# Patient Record
Sex: Female | Born: 1970
Health system: Southern US, Community
[De-identification: ages and names within clinical notes are randomized; demographics above are authoritative.]

## PROBLEM LIST (undated history)

## (undated) DIAGNOSIS — N879 Dysplasia of cervix uteri, unspecified: Secondary | ICD-10-CM

## (undated) DIAGNOSIS — E782 Mixed hyperlipidemia: Secondary | ICD-10-CM

## (undated) DIAGNOSIS — J309 Allergic rhinitis, unspecified: Secondary | ICD-10-CM

## (undated) DIAGNOSIS — B019 Varicella without complication: Secondary | ICD-10-CM

## (undated) DIAGNOSIS — F17200 Nicotine dependence, unspecified, uncomplicated: Secondary | ICD-10-CM

## (undated) DIAGNOSIS — R7309 Other abnormal glucose: Secondary | ICD-10-CM

## (undated) DIAGNOSIS — F341 Dysthymic disorder: Secondary | ICD-10-CM

## (undated) DIAGNOSIS — F411 Generalized anxiety disorder: Secondary | ICD-10-CM

## (undated) DIAGNOSIS — G4700 Insomnia, unspecified: Secondary | ICD-10-CM

## (undated) DIAGNOSIS — E079 Disorder of thyroid, unspecified: Secondary | ICD-10-CM

## (undated) DIAGNOSIS — R319 Hematuria, unspecified: Secondary | ICD-10-CM

## (undated) DIAGNOSIS — N912 Amenorrhea, unspecified: Secondary | ICD-10-CM

## (undated) DIAGNOSIS — L708 Other acne: Secondary | ICD-10-CM

## (undated) DIAGNOSIS — M1990 Unspecified osteoarthritis, unspecified site: Secondary | ICD-10-CM

## (undated) DIAGNOSIS — G4710 Hypersomnia, unspecified: Secondary | ICD-10-CM

## (undated) DIAGNOSIS — T7840XA Allergy, unspecified, initial encounter: Secondary | ICD-10-CM

## (undated) DIAGNOSIS — E559 Vitamin D deficiency, unspecified: Secondary | ICD-10-CM

## (undated) DIAGNOSIS — C801 Malignant (primary) neoplasm, unspecified: Secondary | ICD-10-CM

## (undated) HISTORY — DX: Dysthymic disorder: F34.1

## (undated) HISTORY — DX: Other abnormal glucose: R73.09

## (undated) HISTORY — DX: Nicotine dependence, unspecified, uncomplicated: F17.200

## (undated) HISTORY — DX: Varicella without complication: B01.9

## (undated) HISTORY — DX: Other acne: L70.8

## (undated) HISTORY — DX: Allergy, unspecified, initial encounter: T78.40XA

## (undated) HISTORY — DX: Hematuria, unspecified: R31.9

## (undated) HISTORY — DX: Insomnia, unspecified: G47.00

## (undated) HISTORY — DX: Generalized anxiety disorder: F41.1

## (undated) HISTORY — DX: Unspecified osteoarthritis, unspecified site: M19.90

## (undated) HISTORY — DX: Dysplasia of cervix uteri, unspecified: N87.9

## (undated) HISTORY — DX: Malignant (primary) neoplasm, unspecified: C80.1

## (undated) HISTORY — DX: Amenorrhea, unspecified: N91.2

## (undated) HISTORY — DX: Disorder of thyroid, unspecified: E07.9

## (undated) HISTORY — DX: Vitamin D deficiency, unspecified: E55.9

## (undated) HISTORY — DX: Allergic rhinitis, unspecified: J30.9

## (undated) HISTORY — DX: Hypersomnia, unspecified: G47.10

## (undated) HISTORY — DX: Mixed hyperlipidemia: E78.2

---

## 1994-03-20 HISTORY — PX: LAMINECTOMY: SHX219

## 1994-03-20 HISTORY — PX: SPINE SURGERY: SHX786

## 1996-03-20 HISTORY — PX: TUBAL LIGATION: SHX77

## 2003-03-21 HISTORY — PX: OTHER SURGICAL HISTORY: SHX169

## 2007-12-16 ENCOUNTER — Emergency Department (HOSPITAL_COMMUNITY): Admission: EM | Admit: 2007-12-16 | Discharge: 2007-12-16 | Payer: Self-pay | Admitting: Emergency Medicine

## 2010-03-20 HISTORY — PX: TOTAL THYROIDECTOMY: SHX2547

## 2010-07-20 ENCOUNTER — Ambulatory Visit
Admission: RE | Admit: 2010-07-20 | Discharge: 2010-07-20 | Disposition: A | Discharge status: Home / Self Care | Payer: BC Managed Care – PPO | Source: Ambulatory Visit | Attending: Family Medicine | Admitting: Family Medicine

## 2010-07-20 ENCOUNTER — Other Ambulatory Visit: Payer: Self-pay | Admitting: Family Medicine

## 2010-07-20 DIAGNOSIS — R221 Localized swelling, mass and lump, neck: Secondary | ICD-10-CM

## 2010-08-17 ENCOUNTER — Other Ambulatory Visit: Payer: Self-pay | Admitting: General Surgery

## 2010-08-17 DIAGNOSIS — E041 Nontoxic single thyroid nodule: Secondary | ICD-10-CM

## 2010-08-19 ENCOUNTER — Ambulatory Visit
Admission: RE | Admit: 2010-08-19 | Discharge: 2010-08-19 | Disposition: A | Discharge status: Home / Self Care | Payer: BC Managed Care – PPO | Source: Ambulatory Visit | Attending: General Surgery | Admitting: General Surgery

## 2010-08-19 ENCOUNTER — Other Ambulatory Visit (HOSPITAL_COMMUNITY)
Admission: RE | Admit: 2010-08-19 | Discharge: 2010-08-19 | Disposition: A | Discharge status: Home / Self Care | Payer: BC Managed Care – PPO | Source: Ambulatory Visit | Attending: Interventional Radiology | Admitting: Interventional Radiology

## 2010-08-19 ENCOUNTER — Other Ambulatory Visit: Payer: Self-pay | Admitting: Interventional Radiology

## 2010-08-19 DIAGNOSIS — E041 Nontoxic single thyroid nodule: Secondary | ICD-10-CM

## 2010-08-19 DIAGNOSIS — E049 Nontoxic goiter, unspecified: Secondary | ICD-10-CM | POA: Insufficient documentation

## 2010-09-18 DIAGNOSIS — C801 Malignant (primary) neoplasm, unspecified: Secondary | ICD-10-CM

## 2010-09-18 HISTORY — DX: Malignant (primary) neoplasm, unspecified: C80.1

## 2010-09-29 ENCOUNTER — Encounter (HOSPITAL_COMMUNITY): Payer: BC Managed Care – PPO

## 2010-09-29 ENCOUNTER — Other Ambulatory Visit (INDEPENDENT_AMBULATORY_CARE_PROVIDER_SITE_OTHER): Payer: Self-pay | Admitting: General Surgery

## 2010-09-29 ENCOUNTER — Ambulatory Visit (HOSPITAL_COMMUNITY)
Admission: RE | Admit: 2010-09-29 | Discharge: 2010-09-29 | Disposition: A | Discharge status: Home / Self Care | Payer: BC Managed Care – PPO | Source: Ambulatory Visit | Attending: General Surgery | Admitting: General Surgery

## 2010-09-29 DIAGNOSIS — Z01812 Encounter for preprocedural laboratory examination: Secondary | ICD-10-CM | POA: Insufficient documentation

## 2010-09-29 DIAGNOSIS — Z01811 Encounter for preprocedural respiratory examination: Secondary | ICD-10-CM

## 2010-09-29 DIAGNOSIS — Z01818 Encounter for other preprocedural examination: Secondary | ICD-10-CM | POA: Insufficient documentation

## 2010-09-29 DIAGNOSIS — Z01810 Encounter for preprocedural cardiovascular examination: Secondary | ICD-10-CM | POA: Insufficient documentation

## 2010-09-29 LAB — CBC
MCH: 28.3 pg (ref 26.0–34.0)
MCHC: 32.7 g/dL (ref 30.0–36.0)
Platelets: 171 10*3/uL (ref 150–400)
RBC: 4.45 MIL/uL (ref 3.87–5.11)
RDW: 12.8 % (ref 11.5–15.5)

## 2010-09-29 LAB — BASIC METABOLIC PANEL
CO2: 27 mEq/L (ref 19–32)
Calcium: 9.7 mg/dL (ref 8.4–10.5)
Chloride: 102 mEq/L (ref 96–112)
GFR calc Af Amer: >60 mL/min (ref >60)
Sodium: 134 mEq/L — ABNORMAL LOW (ref 135–145)

## 2010-09-29 LAB — SURGICAL PCR SCREEN
MRSA, PCR: NEGATIVE
Staphylococcus aureus: NEGATIVE

## 2010-09-29 LAB — HCG, SERUM, QUALITATIVE: Preg, Serum: NEGATIVE

## 2010-10-07 ENCOUNTER — Other Ambulatory Visit (INDEPENDENT_AMBULATORY_CARE_PROVIDER_SITE_OTHER): Payer: Self-pay | Admitting: General Surgery

## 2010-10-07 ENCOUNTER — Ambulatory Visit (HOSPITAL_COMMUNITY)
Admission: RE | Admit: 2010-10-07 | Discharge: 2010-10-08 | Disposition: A | Discharge status: Home / Self Care | Payer: BC Managed Care – PPO | Source: Ambulatory Visit | Attending: General Surgery | Admitting: General Surgery

## 2010-10-07 DIAGNOSIS — C73 Malignant neoplasm of thyroid gland: Secondary | ICD-10-CM

## 2010-10-07 DIAGNOSIS — Z01818 Encounter for other preprocedural examination: Secondary | ICD-10-CM | POA: Insufficient documentation

## 2010-10-07 DIAGNOSIS — Z01812 Encounter for preprocedural laboratory examination: Secondary | ICD-10-CM | POA: Insufficient documentation

## 2010-10-07 DIAGNOSIS — E119 Type 2 diabetes mellitus without complications: Secondary | ICD-10-CM | POA: Insufficient documentation

## 2010-10-07 DIAGNOSIS — Z01810 Encounter for preprocedural cardiovascular examination: Secondary | ICD-10-CM | POA: Insufficient documentation

## 2010-10-08 LAB — CALCIUM: Calcium: 8.8 mg/dL (ref 8.4–10.5)

## 2010-10-10 NOTE — Op Note (Signed)
NAME:  Taylor, Powers NO.:  [id number]  MEDICAL RECORD NO.:  [id number]  LOCATION:  DAYL                         FACILITY:  [facility]  PHYSICIAN:  Sharlet Salina T. Hager Compston, M.D.DATE OF BIRTH:  [birth year]  DATE OF PROCEDURE:  10/07/2010 DATE OF DISCHARGE:                              OPERATIVE REPORT   PREOPERATIVE DIAGNOSIS:  Right thyroid mass.  POSTOPERATIVE DIAGNOSIS:  Right thyroid mass.  SURGICAL PROCEDURES:  Total thyroidectomy.  SURGEON:  Lorne Skeens. Raelle Chambers, M.D.  ASSISTANT:  Wilmon Arms. Tsuei, M.D.  ANESTHESIA:  General.  BRIEF HISTORY:  Taylor Powers is a 40 year old female who presents with an enlarging mass in the right lobe of her thyroid gland.  This was noted recently by the patient.  Ultrasound has revealed a 4.3-cm mass in the lower right pole, solid with internal microcalcifications.  Fine-needle aspirate has shown follicular cells with mild atypia.  This constellation of findings, I have recommended thyroidectomy.  We have discussed pros and cons of lobectomy versus total thyroidectomy and due to the suspicious nature of the mass and also the fact she has a couple of small nodules on the opposite side, we have elected to proceed with total thyroidectomy.  The nature of the procedure, indications, risks of anesthetic complications, bleeding, infection, injury to the recurrent laryngeal nerve with permanent hoarseness, injury to the parathyroid glands with hypocalcemia had been discussed and understood.  She is now brought to the operating room for this procedure.  DESCRIPTION OF OPERATION:  The patient is brought to the operating room, placed in supine position on the operating table and general orotracheal anesthesia was induced.  She was carefully positioned with her neck extended and the neck and upper chest widely and sterilely prepped and draped.  PAS were in place.  Correct patient and procedure were verified.  A curvilinear incision was  made in the skin crease halfway between the sternal notch and the thyroid cartilage.  Dissection was carried down through subcutaneous tissue and platysma using cautery. Subplatysmal flaps were then developed superiorly to the thyroid cartilage, inferiorly to the sternal notch and laterally out to the anterior border of the sternocleidomastoid.  The strap muscles were then divided in the midline using cautery.  Using careful blunt and cautery dissection, the right side was approached first.  The strap muscles were dissected up off the anterior portion of the right thyroid lobe.  There was a large palpable mass inferiorly.  Careful blunt dissection was used to mobilize the gland anteriorly and inferiorly.  Some areolar tissue and vessels along the inferior pole were divided with harmonic scalpel anteriorly.  Middle thyroid vein was identified, clipped and divided with harmonic scalpel.  Gland was further bluntly mobilized and the upper pole was then exposed and using careful blunt dissection, the superior thyroid vessels were exposed.  We did see what appeared to be the superior parathyroid gland on this side and it was preserved.  The superior pole vessels were isolated, clipped proximally with medium clip and divided with the harmonic scalpel freeing the upper pole.  This allowed the large mass in the gland to be mobilized up anteriorly and the tracheoesophageal groove and inferior  thyroid artery branch was exposed.  Individual branches of the inferior thyroid artery were then isolated, clipped proximally and divided with harmonic scalpel.  As dissection progressed posterior and laterally, we clearly identified the recurrent laryngeal nerve which was then kept in view and protected throughout the manner of dissection.  We continued to divide the small individual branches of the inferior artery on the gland until it was mobilized up to the ligament of Berry and away from the  recurrent laryngeal nerve insertion.  The suspensory ligaments were then divided with cautery and the isthmus freed and the right lobe completely dissected.  Attention was then turned to the left side.  It was exposed and dissected in identical fashion.  I was not able to feel any definite palpable nodules here.  The superior pole was mobilized in identical fashion.  We were also able to see what appeared to be superior parathyroid on the left side as well.  Gland was then mobilized up out of the posterior neck.  The recurrent laryngeal nerve was identified early on in the dissection.  The gland was quite tight to the recurrent laryngeal nerve near its insertion and we did leave a small amount of thyroid tissue overlying the nerve in order to protect it.  The suspensory ligament was then divided with cautery.  A few inferior areolar attachments divided with cautery and the gland removed.  It was oriented and sent for permanent pathology.  The neck was irrigated and complete hemostasis assured.  Surgicel was placed in the thyroid bed. The strap muscles were closed in midline with interrupted 3-0 Vicryl. Platysma was closed with interrupted 3-0 Vicryl and the skin with subcuticular 4-0 Monocryl and Dermabond.  Sponge and needle counts correct.  The patient was taken to recovery in good condition.     Lorne Skeens. Dyonna Jaspers, M.D.     Tory Emerald  D:  10/07/2010  T:  10/07/2010  Job:  [id number]  Electronically Signed by Glenna Fellows M.D. on 10/10/2010 12:25:10 PM

## 2010-10-11 ENCOUNTER — Telehealth (INDEPENDENT_AMBULATORY_CARE_PROVIDER_SITE_OTHER): Payer: Self-pay | Admitting: General Surgery

## 2010-10-11 NOTE — Telephone Encounter (Signed)
Discussed path report with pt. She is doing well from surgery

## 2010-10-28 ENCOUNTER — Encounter (INDEPENDENT_AMBULATORY_CARE_PROVIDER_SITE_OTHER): Payer: BC Managed Care – PPO | Admitting: General Surgery

## 2010-11-02 ENCOUNTER — Encounter (INDEPENDENT_AMBULATORY_CARE_PROVIDER_SITE_OTHER): Payer: Self-pay | Admitting: General Surgery

## 2010-11-03 ENCOUNTER — Encounter (INDEPENDENT_AMBULATORY_CARE_PROVIDER_SITE_OTHER): Payer: Self-pay

## 2010-11-03 ENCOUNTER — Ambulatory Visit (INDEPENDENT_AMBULATORY_CARE_PROVIDER_SITE_OTHER): Payer: BC Managed Care – PPO | Admitting: General Surgery

## 2010-11-03 VITALS — BP 116/76 | HR 64 | Temp 98.5°F

## 2010-11-03 DIAGNOSIS — T8140XA Infection following a procedure, unspecified, initial encounter: Secondary | ICD-10-CM

## 2010-11-03 DIAGNOSIS — C73 Malignant neoplasm of thyroid gland: Secondary | ICD-10-CM | POA: Insufficient documentation

## 2010-11-03 LAB — T4, FREE: Free T4: 0.89 ng/dL (ref 0.80–1.80)

## 2010-11-03 LAB — TSH: TSH: 7.184 u[IU]/mL — ABNORMAL HIGH (ref 0.350–4.500)

## 2010-11-03 NOTE — Patient Instructions (Signed)
I will call with results of lab work. We will schedule for I-131 treatment. Call for questions or concerns.

## 2010-11-03 NOTE — Progress Notes (Signed)
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 She was doing very well. No throat or voice complaints. She is back at work. She has no symptoms of hypo-or hyperthyroidism on 75 mcg of Synthroid daily.  On examination her wound is nicely healed without complication.  She is doing well postoperatively. We have discussed followup I-131 treatment and will get this arranged for her. Check TSH and T4 levels. She will return for cancer followup in 6 months.

## 2010-11-07 ENCOUNTER — Other Ambulatory Visit (INDEPENDENT_AMBULATORY_CARE_PROVIDER_SITE_OTHER): Payer: Self-pay | Admitting: General Surgery

## 2010-11-08 ENCOUNTER — Other Ambulatory Visit (INDEPENDENT_AMBULATORY_CARE_PROVIDER_SITE_OTHER): Payer: Self-pay | Admitting: General Surgery

## 2010-11-08 ENCOUNTER — Telehealth (INDEPENDENT_AMBULATORY_CARE_PROVIDER_SITE_OTHER): Payer: Self-pay | Admitting: General Surgery

## 2010-11-08 ENCOUNTER — Other Ambulatory Visit (INDEPENDENT_AMBULATORY_CARE_PROVIDER_SITE_OTHER): Payer: Self-pay

## 2010-11-08 DIAGNOSIS — C73 Malignant neoplasm of thyroid gland: Secondary | ICD-10-CM

## 2010-11-08 NOTE — Telephone Encounter (Signed)
Spoke with Ms. Eichler 11/08/10, patient lab result for TSH is noted to be abnormal and patient is c/o fatigue over the weekend.  Patient is currently taking .75 mcg Synthroid, please advise if dosage need's to be changed or to continue with current mcg.

## 2010-11-10 ENCOUNTER — Other Ambulatory Visit (INDEPENDENT_AMBULATORY_CARE_PROVIDER_SITE_OTHER): Payer: Self-pay | Admitting: General Surgery

## 2010-11-10 DIAGNOSIS — R7989 Other specified abnormal findings of blood chemistry: Secondary | ICD-10-CM

## 2010-11-10 MED ORDER — LEVOTHYROXINE SODIUM 88 MCG PO TABS: 88.0000 ug | ORAL_TABLET | Freq: Every day | ORAL | Status: DC

## 2010-11-23 ENCOUNTER — Ambulatory Visit (HOSPITAL_COMMUNITY): Payer: BC Managed Care – PPO

## 2010-11-24 ENCOUNTER — Ambulatory Visit (HOSPITAL_COMMUNITY): Payer: BC Managed Care – PPO

## 2010-11-25 ENCOUNTER — Encounter (HOSPITAL_COMMUNITY): Payer: BC Managed Care – PPO

## 2010-11-28 ENCOUNTER — Other Ambulatory Visit (INDEPENDENT_AMBULATORY_CARE_PROVIDER_SITE_OTHER): Payer: Self-pay | Admitting: General Surgery

## 2010-11-28 ENCOUNTER — Encounter (HOSPITAL_COMMUNITY): Payer: BC Managed Care – PPO

## 2010-11-28 DIAGNOSIS — C73 Malignant neoplasm of thyroid gland: Secondary | ICD-10-CM

## 2010-11-29 ENCOUNTER — Ambulatory Visit (HOSPITAL_COMMUNITY): Payer: BC Managed Care – PPO

## 2010-11-30 ENCOUNTER — Encounter (HOSPITAL_COMMUNITY): Payer: BC Managed Care – PPO

## 2010-12-05 ENCOUNTER — Encounter (HOSPITAL_COMMUNITY): Payer: BC Managed Care – PPO

## 2010-12-06 ENCOUNTER — Other Ambulatory Visit (INDEPENDENT_AMBULATORY_CARE_PROVIDER_SITE_OTHER): Payer: Self-pay | Admitting: General Surgery

## 2010-12-06 DIAGNOSIS — C73 Malignant neoplasm of thyroid gland: Secondary | ICD-10-CM

## 2010-12-12 ENCOUNTER — Encounter (HOSPITAL_COMMUNITY)
Admission: RE | Admit: 2010-12-12 | Discharge: 2010-12-12 | Disposition: A | Discharge status: Home / Self Care | Payer: BC Managed Care – PPO | Source: Ambulatory Visit | Attending: General Surgery | Admitting: General Surgery

## 2010-12-12 DIAGNOSIS — C73 Malignant neoplasm of thyroid gland: Secondary | ICD-10-CM | POA: Insufficient documentation

## 2010-12-13 ENCOUNTER — Encounter (HOSPITAL_COMMUNITY)
Admission: RE | Admit: 2010-12-13 | Discharge: 2010-12-13 | Disposition: A | Discharge status: Home / Self Care | Payer: BC Managed Care – PPO | Source: Ambulatory Visit | Attending: General Surgery | Admitting: General Surgery

## 2010-12-14 ENCOUNTER — Encounter (HOSPITAL_COMMUNITY)
Admission: RE | Admit: 2010-12-14 | Discharge: 2010-12-14 | Disposition: A | Discharge status: Home / Self Care | Payer: BC Managed Care – PPO | Source: Ambulatory Visit | Attending: General Surgery | Admitting: General Surgery

## 2010-12-14 LAB — HCG, SERUM, QUALITATIVE: Preg, Serum: NEGATIVE

## 2010-12-14 MED ORDER — SODIUM IODIDE I 131 CAPSULE
108.5000 | Freq: Once | INTRAVENOUS | Status: AC | PRN
  Administered 2010-12-14: 108.5 via ORAL

## 2010-12-26 ENCOUNTER — Other Ambulatory Visit (INDEPENDENT_AMBULATORY_CARE_PROVIDER_SITE_OTHER): Payer: Self-pay | Admitting: General Surgery

## 2010-12-26 ENCOUNTER — Encounter (HOSPITAL_COMMUNITY)
Admission: RE | Admit: 2010-12-26 | Discharge: 2010-12-26 | Disposition: A | Discharge status: Home / Self Care | Payer: BC Managed Care – PPO | Source: Ambulatory Visit | Attending: General Surgery | Admitting: General Surgery

## 2010-12-26 DIAGNOSIS — C73 Malignant neoplasm of thyroid gland: Secondary | ICD-10-CM

## 2011-01-04 ENCOUNTER — Other Ambulatory Visit: Payer: Self-pay | Admitting: Family Medicine

## 2011-01-04 DIAGNOSIS — Z1231 Encounter for screening mammogram for malignant neoplasm of breast: Secondary | ICD-10-CM

## 2011-02-03 ENCOUNTER — Ambulatory Visit: Payer: BC Managed Care – PPO

## 2011-07-21 ENCOUNTER — Ambulatory Visit: Payer: BC Managed Care – PPO

## 2011-12-14 ENCOUNTER — Ambulatory Visit (INDEPENDENT_AMBULATORY_CARE_PROVIDER_SITE_OTHER): Payer: Commercial Managed Care - PPO | Admitting: Physician Assistant

## 2011-12-14 VITALS — BP 116/84 | HR 114 | Temp 98.2°F | Resp 16 | Ht 67.0 in | Wt 219.0 lb

## 2011-12-14 DIAGNOSIS — J029 Acute pharyngitis, unspecified: Secondary | ICD-10-CM

## 2011-12-14 DIAGNOSIS — J020 Streptococcal pharyngitis: Secondary | ICD-10-CM

## 2011-12-14 MED ORDER — DIPHENHYD-HYDROCORT-NYSTATIN MT SUSP: 5.0000 mL | Freq: Four times a day (QID) | OROMUCOSAL | Status: DC | PRN

## 2011-12-14 MED ORDER — AZITHROMYCIN 250 MG PO TABS: ORAL_TABLET | ORAL | Status: DC

## 2011-12-14 NOTE — Progress Notes (Signed)
Patient ID: Taylor Powers MRN: [id number], DOB: Mar 29, 1970, 41 y.o. Date of Encounter: 12/14/2011, 8:59 AM  Primary Physician: Nilda Simmer, MD  Chief Complaint:  Chief Complaint  Patient presents with  . Sore Throat    x yesterday    HPI: 41 y.o. year old female presents with a one day history of sore throat. No fever or chills. No cough, congestion, rhinorrhea, sinus pressure, otalgia, or headache. Normal hearing. No GI complaints. Able to swallow saliva, but hurts to do so. "It feels like knives." "Last time it felt like this it was strep throat." Sore throat is worse along both sides. Has tried chloraseptic spray without success. Decreased appetite secondary to sore throat. Partner with bronchitis, being treated with Z pack and prednisone.   Past Medical History  Diagnosis Date  . Diabetes mellitus   . Thyroid disease   . Arthritis      Home Meds: Prior to Admission medications   Medication Sig Start Date End Date Taking? Authorizing Provider  ALPRAZolam (XANAX) 0.25 MG tablet Take 0.25 mg by mouth as needed.    Yes Historical Provider, MD  buPROPion (WELLBUTRIN XL) 150 MG 24 hr tablet Take 150 mg by mouth daily.     Yes Historical Provider, MD  levothyroxine (SYNTHROID, LEVOTHROID) 112 MCG tablet Take 112 mcg by mouth daily.   Yes Historical Provider, MD  metaxalone (SKELAXIN) 800 MG tablet Take 800 mg by mouth as needed.    Yes Historical Provider, MD  sertraline (ZOLOFT) 100 MG tablet Take 100 mg by mouth daily.   Yes Historical Provider, MD  Cholecalciferol (VITAMIN D3) 50000 UNITS CAPS Take by mouth once a week.      Historical Provider, MD  doxycycline (DORYX) 100 MG DR capsule Take 100 mg by mouth 2 (two) times daily.      Historical Provider, MD  levothyroxine (SYNTHROID, LEVOTHROID) 88 MCG tablet Take 1 tablet (88 mcg total) by mouth daily. 11/10/10 11/10/11  Mariella Saa, MD  sertraline (ZOLOFT) 50 MG tablet Take 50 mg by mouth daily.      Historical Provider, MD    traZODone (DESYREL) 50 MG tablet Take 50 mg by mouth as needed.     Historical Provider, MD    Allergies:  Allergies  Allergen Reactions  . Sulfa Antibiotics Hives    All over body    History   Social History  . Marital Status: Single    Spouse Name: N/A    Number of Children: N/A  . Years of Education: N/A   Occupational History  . Not on file.   Social History Main Topics  . Smoking status: Current Every Day Smoker -- 1.0 packs/day    Types: Cigarettes  . Smokeless tobacco: Not on file  . Alcohol Use: No  . Drug Use: No  . Sexually Active: Not on file   Other Topics Concern  . Not on file   Social History Narrative  . No narrative on file     Review of Systems: Constitutional: negative for chills, fever, night sweats or weight changes HEENT: see above Cardiovascular: negative for chest pain or palpitations Respiratory: negative for hemoptysis, wheezing, or shortness of breath Abdominal: negative for abdominal pain, nausea, vomiting or diarrhea Dermatological: negative for rash Neurologic: negative for headache   Physical Exam: Blood pressure 116/84, pulse 114, temperature 98.2 F (36.8 C), temperature source Oral, resp. rate 16, height 5\' 7"  (1.702 m), weight 219 lb (99.338 kg), SpO2 97.00%., Body mass index is  34.30 kg/(m^2). General: Well developed, well nourished, in no acute distress. Head: Normocephalic, atraumatic, eyes without discharge, sclera non-icteric, nares are patent. Bilateral auditory canals clear, TM's are without perforation, pearly grey with reflective cone of light bilaterally. No sinus TTP. Oral cavity moist, dentition normal. Posterior pharynx with post nasal drip and mild erythema. No peritonsillar abscess or tonsillar exudate. Neck: Supple. No thyromegaly. Full ROM. <2cm AC. Lungs: Clear bilaterally to auscultation without wheezes, rales, or rhonchi. Breathing is unlabored. Heart: RRR with S1 S2. No murmurs, rubs, or gallops  appreciated. Abdomen: Soft, non-tender, non-distended with normoactive bowel sounds. No hepatomegaly. No rebound/guarding. No obvious abdominal masses. Msk:  Strength and tone normal for age. Extremities: No clubbing or cyanosis. No edema. Neuro: Alert and oriented X 3. Moves all extremities spontaneously. CNII-XII grossly in tact. Psych:  Responds to questions appropriately with a normal affect.   Labs: Results for orders placed in visit on 12/14/11  POCT RAPID STREP A (OFFICE)      Component Value Range   Rapid Strep A Screen Negative  Negative     ASSESSMENT AND PLAN:  41 y.o. year old female with pharyngitis -Azithromycin 250 MG #6 2 po first day then 1 po next 4 days no RF -DMM 1 tsp po qid prn sore throat no RF -Tylenol/Motrin prn -Rest/fluids -Stop tobacco use -RTC precautions -RTC 3-5 days if no improvement  Signed, Eula Listen, PA-C 12/14/2011 8:59 AM

## 2011-12-16 ENCOUNTER — Encounter: Payer: Self-pay | Admitting: *Deleted

## 2011-12-21 ENCOUNTER — Encounter: Payer: Self-pay | Admitting: Family Medicine

## 2011-12-21 ENCOUNTER — Ambulatory Visit: Payer: Self-pay | Admitting: Emergency Medicine

## 2011-12-21 DIAGNOSIS — Z111 Encounter for screening for respiratory tuberculosis: Secondary | ICD-10-CM

## 2011-12-21 NOTE — Progress Notes (Signed)
  Tuberculosis Risk Questionnaire  1. Were you born outside the [place] in one of the following parts of the world:    [place], [place], [place], [place] or [place]?  No  2. Have you traveled outside the [place] and lived for more than one month in one of the following parts of the world:  [place], [place], [place], [place] or [place]?  No  3. Do you have a compromised immune system such as from any of the following conditions:  HIV/AIDS, organ or bone marrow transplantation, diabetes, immunosuppressive   medicines (e.g. Prednisone, Remicaide), leukemia, lymphoma, cancer of the   head or neck, gastrectomy or jejunal bypass, end-stage renal disease (on   dialysis), or silicosis?  No    4. Have you ever done one of the following:    Used crack cocaine, injected illegal drugs, worked or resided in jail or prison,   worked or resided at a homeless shelter, or worked as a Research scientist (physical sciences) in   direct contact with patients?  Yes   5. Have you ever been exposed to anyone with infectious tuberculosis?  Yes    Tuberculosis Symptom Questionnaire  Do you currently have any of the following symptoms?  1. Unexplained cough lasting more than 3 weeks? No  Unexplained fever lasting more than 3 weeks. No   3. Night Sweats (sweating that leaves the bedclothes and sheets wet)   No  4. Shortness of Breath No  5. Chest Pain No  6. Unintentional weight loss  No  7. Unexplained fatigue (very tired for no reason) No   Reviewed with Dr. Cleta Alberts who authorized to administer TB skin test.

## 2011-12-22 NOTE — Progress Notes (Signed)
  Subjective:    Patient ID: Taylor Powers, female    DOB: [birth year], 41 y.o.   MRN: [id number]  HPI    Review of Systems     Objective:   Physical Exam        Assessment & Plan:

## 2011-12-23 ENCOUNTER — Encounter (INDEPENDENT_AMBULATORY_CARE_PROVIDER_SITE_OTHER): Payer: Commercial Managed Care - PPO | Admitting: Family Medicine

## 2011-12-23 DIAGNOSIS — Z111 Encounter for screening for respiratory tuberculosis: Secondary | ICD-10-CM

## 2011-12-23 LAB — TB SKIN TEST
Induration: 0 mm
TB Skin Test: NEGATIVE

## 2011-12-25 ENCOUNTER — Ambulatory Visit: Payer: Self-pay | Admitting: Family Medicine

## 2012-01-01 ENCOUNTER — Telehealth: Payer: Self-pay

## 2012-01-01 NOTE — Telephone Encounter (Signed)
Dr. Smith, do you have this form? 

## 2012-01-01 NOTE — Telephone Encounter (Signed)
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 that was sent to [facility] last week.  The patient is starting new job and her employer is requiring this paperwork.  Please review and expedite if possible.  Per patient there is fax number on form that the form should be faxed to.  Please call the patient at [phone].   The patient stated it is ok to leave message on voicemail as she is in orientation for her new job.

## 2012-01-01 NOTE — Telephone Encounter (Signed)
I have signed form and placed in outgoing fax stack at front desk.  Please advise pt that form signed and completed. KMS

## 2012-01-02 NOTE — Telephone Encounter (Signed)
Left message on machine for patient to call back.

## 2012-01-05 NOTE — Telephone Encounter (Signed)
LMOM for pt to return call. 

## 2012-01-05 NOTE — Telephone Encounter (Signed)
LMOM for pt that form was completed and faxed on 14th and she only needs to CB if she has any further ?s/concerns.

## 2012-02-08 ENCOUNTER — Telehealth: Payer: Self-pay | Admitting: *Deleted

## 2012-02-08 MED ORDER — SERTRALINE HCL 100 MG PO TABS: 100.0000 mg | ORAL_TABLET | Freq: Every day | ORAL | Status: DC

## 2012-02-08 NOTE — Telephone Encounter (Signed)
OK to refill: Zoloft 100mg  one daily #90 no refills. KMS

## 2012-02-08 NOTE — Telephone Encounter (Signed)
done

## 2012-02-08 NOTE — Telephone Encounter (Signed)
Pharmacy requesting a refill on Zoloft 100mg  1 qd for anxiety #90.

## 2012-02-09 ENCOUNTER — Telehealth: Payer: Self-pay

## 2012-02-09 NOTE — Telephone Encounter (Signed)
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 SHE IS COMPLETELY OUT OF HER ZOLOFT 100MG  AND NEEDS IT TO BE CALLED INTO THE PHARMACY AS SOON AS POSSIBLE. SHE IS DR. Michaelle Copas PATIENT AND SHE FOLLOWED HER HERE WHEN SHE CAME BACK TO OUR PRACTICE. PLEASE CALL HER WHEN IT HAS BEEN DONE. BEST PHONE [phone] (CELL)  PHARMACY CHOICE IS [facility] IN [place], [place] [phone].  MBC

## 2012-02-09 NOTE — Telephone Encounter (Signed)
Can we refill this prescription for her?

## 2012-02-09 NOTE — Telephone Encounter (Signed)
It was ordered yesterday, see telephone call from 02/08/12

## 2012-02-10 NOTE — Telephone Encounter (Signed)
Patient advised.

## 2012-03-04 ENCOUNTER — Telehealth: Payer: Self-pay | Admitting: *Deleted

## 2012-03-04 NOTE — Telephone Encounter (Signed)
Pharmacy requesting refill on sertraline 100mg  #90.  Please close note if you refill

## 2012-03-05 MED ORDER — SERTRALINE HCL 100 MG PO TABS: ORAL_TABLET | ORAL | Status: DC

## 2012-04-02 ENCOUNTER — Telehealth: Payer: Self-pay | Admitting: *Deleted

## 2012-04-02 MED ORDER — ATORVASTATIN CALCIUM 10 MG PO TABS: 10.0000 mg | ORAL_TABLET | Freq: Every day | ORAL | Status: DC

## 2012-04-02 NOTE — Telephone Encounter (Signed)
Requesting refill on lipitor

## 2012-04-29 ENCOUNTER — Telehealth: Payer: Self-pay | Admitting: *Deleted

## 2012-04-29 NOTE — Telephone Encounter (Signed)
We do not Rx this for the patient.

## 2012-04-29 NOTE — Telephone Encounter (Signed)
[facility] sending refill request on levothyroxine and they would like to know if she can have Sandoz brand.

## 2012-04-30 NOTE — Telephone Encounter (Signed)
Called pharmacy to advise. This was written by Dr Katrinka Blazing, in [place]. Is there a brand difference? Please advise.

## 2012-05-21 ENCOUNTER — Other Ambulatory Visit: Payer: Self-pay | Admitting: Family Medicine

## 2012-05-21 MED ORDER — LEVOTHYROXINE SODIUM 112 MCG PO TABS: 112.0000 ug | ORAL_TABLET | Freq: Every day | ORAL | Status: DC

## 2012-06-18 ENCOUNTER — Encounter: Payer: Self-pay | Admitting: Family Medicine

## 2012-06-18 ENCOUNTER — Ambulatory Visit (INDEPENDENT_AMBULATORY_CARE_PROVIDER_SITE_OTHER): Payer: BC Managed Care – PPO | Admitting: Family Medicine

## 2012-06-18 VITALS — BP 124/72 | HR 82 | Temp 98.0°F | Resp 16 | Ht 67.0 in | Wt 240.0 lb

## 2012-06-18 DIAGNOSIS — Z01419 Encounter for gynecological examination (general) (routine) without abnormal findings: Secondary | ICD-10-CM

## 2012-06-18 DIAGNOSIS — Z1231 Encounter for screening mammogram for malignant neoplasm of breast: Secondary | ICD-10-CM

## 2012-06-18 DIAGNOSIS — Z0000 Encounter for general adult medical examination without abnormal findings: Secondary | ICD-10-CM

## 2012-06-18 LAB — CBC WITH DIFFERENTIAL/PLATELET
Basophils Relative: 1 % (ref 0–1)
Eosinophils Absolute: 0.2 10*3/uL (ref 0.0–0.7)
Eosinophils Relative: 3 % (ref 0–5)
Hemoglobin: 12.7 g/dL (ref 12.0–15.0)
MCH: 29 pg (ref 26.0–34.0)
MCHC: 34.9 g/dL (ref 30.0–36.0)
MCV: 83.1 fL (ref 78.0–100.0)
Monocytes Relative: 7 % (ref 3–12)
Neutrophils Relative %: 61 % (ref 43–77)
Platelets: 213 10*3/uL (ref 150–400)

## 2012-06-18 LAB — HEMOGLOBIN A1C
Hgb A1c MFr Bld: 5.4 % (ref <5.7)
Mean Plasma Glucose: 108 mg/dL (ref <117)

## 2012-06-18 LAB — T4, FREE: Free T4: 0.94 ng/dL (ref 0.80–1.80)

## 2012-06-18 LAB — TSH: TSH: 33.994 u[IU]/mL — ABNORMAL HIGH (ref 0.350–4.500)

## 2012-06-18 LAB — IFOBT (OCCULT BLOOD): IFOBT: NEGATIVE

## 2012-06-18 NOTE — Patient Instructions (Addendum)
Routine general medical examination at a health care facility - Plan: Vitamin D 25 hydroxy, Folate, T4, free, TSH, Vitamin B12, CBC with Differential, Comprehensive metabolic panel, CK, Hemoglobin A1c, Lipid panel, POCT urinalysis dipstick, IFOBT POC (occult bld, rslt in office)  Routine gynecological examination - Plan: Pap IG and HPV (high risk) DNA detection, MM Digital Screening    1.RECOMMEND FOLLOW-UP IN SIX MONTHS.  2.  RECOMMEND WEIGHT LOSS, EXERCISE, SMOKING CESSATION.

## 2012-06-18 NOTE — Progress Notes (Signed)
  Subjective:    Patient ID: Taylor Powers, female    DOB: [birth year], 42 y.o.   MRN: [id number]  HPI    Review of Systems  Constitutional: Negative.   HENT: Negative.   Eyes: Negative.   Respiratory: Negative.   Cardiovascular: Negative.   Gastrointestinal: Negative.   Endocrine: Negative.   Genitourinary: Negative.   Musculoskeletal: Negative.   Skin: Negative.   Allergic/Immunologic: Negative.   Neurological: Negative.   Hematological: Negative.   Psychiatric/Behavioral: Negative.        Objective:   Physical Exam        Assessment & Plan:

## 2012-06-18 NOTE — Progress Notes (Signed)
[address]   [place], [place]  [address]   [phone]  Subjective:    Patient ID: Taylor Powers, female    DOB: Sep 20, 1970, 42 y.o.   MRN: [id number]  HPI This 42 y.o. female presents to establish care and for CPE.    Last physical 11/2010.  Pap smear 11/2010.   Mammogram 2006.   Colonoscopy never. TDAP UTD. Hepatitis B series through employment. Flu vaccine fall 2013. Eye exam never; no glasses or contacts.  Readers. Dental exam two years ago.   Review of Systems  Constitutional: Negative for fever, chills, diaphoresis, activity change, appetite change, fatigue and unexpected weight change.  HENT: Negative for hearing loss, ear pain, nosebleeds, congestion, sore throat, facial swelling, rhinorrhea, sneezing, drooling, mouth sores, trouble swallowing, neck pain, neck stiffness, dental problem, voice change, postnasal drip, sinus pressure, tinnitus and ear discharge.   Eyes: Negative for photophobia, pain, discharge, redness, itching and visual disturbance.  Respiratory: Positive for apnea. Negative for cough, choking, chest tightness, shortness of breath, wheezing and stridor.   Cardiovascular: Negative for chest pain, palpitations and leg swelling.  Gastrointestinal: Negative for nausea, vomiting, abdominal pain, diarrhea, constipation, blood in stool, abdominal distention, anal bleeding and rectal pain.  Endocrine: Negative for cold intolerance, heat intolerance, polydipsia, polyphagia and polyuria.  Genitourinary: Negative for dysuria, urgency, frequency, hematuria, flank pain, decreased urine volume, vaginal bleeding, vaginal discharge, enuresis, difficulty urinating, genital sores, vaginal pain, menstrual problem, pelvic pain and dyspareunia.  Musculoskeletal: Negative for myalgias, back pain, joint swelling, arthralgias and gait problem.  Skin: Negative for color change, pallor, rash and wound.  Allergic/Immunologic: Positive for environmental allergies. Negative for food  allergies and immunocompromised state.  Neurological: Negative for dizziness, tremors, seizures, syncope, facial asymmetry, speech difficulty, weakness, light-headedness, numbness and headaches.  Hematological: Negative for adenopathy. Does not bruise/bleed easily.  Psychiatric/Behavioral: Negative for suicidal ideas, hallucinations, behavioral problems, confusion, sleep disturbance, self-injury, dysphoric mood, decreased concentration and agitation. The patient is not nervous/anxious and is not hyperactive.        Objective:   Physical Exam  Nursing note and vitals reviewed. Constitutional: She is oriented to person, place, and time. She appears well-developed and well-nourished. No distress.  HENT:  Head: Normocephalic and atraumatic.  Right Ear: External ear normal.  Left Ear: External ear normal.  Nose: Nose normal.  Mouth/Throat: Oropharynx is clear and moist.  Eyes: Conjunctivae and EOM are normal. Pupils are equal, round, and reactive to light.  Neck: Normal range of motion. Neck supple. No JVD present. No thyromegaly present.  Cardiovascular: Normal rate, regular rhythm, normal heart sounds and intact distal pulses.  Exam reveals no gallop and no friction rub.   No murmur heard. Pulmonary/Chest: Effort normal and breath sounds normal. No respiratory distress. She has no wheezes. She has no rales.  Abdominal: Soft. Bowel sounds are normal. She exhibits no distension and no mass. There is no tenderness. There is no rebound and no guarding. Hernia confirmed negative in the right inguinal area and confirmed negative in the left inguinal area.  Genitourinary: Rectum normal, vagina normal and uterus normal. Rectal exam shows no fissure, no mass, no tenderness and anal tone normal. No breast swelling, tenderness, discharge or bleeding. Pelvic exam was performed with patient supine. There is no rash, tenderness or lesion on the right labia. There is no rash, tenderness or lesion on the left  labia. Cervix exhibits no motion tenderness, no discharge and no friability. Right adnexum displays no mass, no tenderness and no  fullness. Left adnexum displays no mass, no tenderness and no fullness.  Musculoskeletal:       Right shoulder: Normal.       Left shoulder: Normal.       Cervical back: Normal.       Thoracic back: Normal.       Lumbar back: Normal.  Lymphadenopathy:    She has no cervical adenopathy.       Right: No inguinal adenopathy present.       Left: No inguinal adenopathy present.  Neurological: She is alert and oriented to person, place, and time. No cranial nerve deficit. She exhibits normal muscle tone. Coordination normal.  Skin: Skin is warm and dry. No rash noted. She is not diaphoretic.  Psychiatric: She has a normal mood and affect. Her behavior is normal. Judgment and thought content normal.       Assessment & Plan:  Routine general medical examination at a health care facility - Plan: Vitamin D 25 hydroxy, Folate, T4, free, TSH, Vitamin B12, CBC with Differential, Comprehensive metabolic panel, CK, Hemoglobin A1c, Lipid panel, POCT urinalysis dipstick, IFOBT POC (occult bld, rslt in office)  Routine gynecological examination - Plan: Pap IG and HPV (high risk) DNA detection, MM Digital Screening   Meds ordered this encounter  Medications  . levothyroxine (SYNTHROID, LEVOTHROID) 137 MCG tablet    Sig: Take 1 tablet (137 mcg total) by mouth daily.    Dispense:  30 tablet    Refill:  5

## 2012-06-19 LAB — COMPREHENSIVE METABOLIC PANEL
Alkaline Phosphatase: 81 U/L (ref 39–117)
CO2: 28 mEq/L (ref 19–32)
Creat: 0.78 mg/dL (ref 0.50–1.10)
Glucose, Bld: 73 mg/dL (ref 70–99)
Sodium: 142 mEq/L (ref 135–145)
Total Bilirubin: 0.4 mg/dL (ref 0.3–1.2)
Total Protein: 7.1 g/dL (ref 6.0–8.3)

## 2012-06-19 LAB — CK: Total CK: 39 U/L (ref 7–177)

## 2012-06-19 LAB — VITAMIN D 25 HYDROXY (VIT D DEFICIENCY, FRACTURES): Vit D, 25-Hydroxy: 27 ng/mL — ABNORMAL LOW (ref 30–89)

## 2012-06-20 LAB — PAP IG AND HPV HIGH-RISK: HPV DNA High Risk: DETECTED — AB

## 2012-06-25 ENCOUNTER — Encounter: Payer: Self-pay | Admitting: Family Medicine

## 2012-06-25 MED ORDER — LEVOTHYROXINE SODIUM 137 MCG PO TABS: 137.0000 ug | ORAL_TABLET | Freq: Every day | ORAL | Status: DC

## 2012-06-27 ENCOUNTER — Other Ambulatory Visit: Payer: Self-pay | Admitting: Physician Assistant

## 2012-06-27 DIAGNOSIS — Z0000 Encounter for general adult medical examination without abnormal findings: Secondary | ICD-10-CM | POA: Insufficient documentation

## 2012-06-27 DIAGNOSIS — Z01419 Encounter for gynecological examination (general) (routine) without abnormal findings: Secondary | ICD-10-CM | POA: Insufficient documentation

## 2012-06-27 MED ORDER — ATORVASTATIN CALCIUM 10 MG PO TABS: 10.0000 mg | ORAL_TABLET | Freq: Every day | ORAL | Status: DC

## 2012-06-27 NOTE — Assessment & Plan Note (Signed)
Pap smear obtained; refer for mammogram.  Gynecological exam completed.  No contraception warranted due to same sex relationship.

## 2012-06-27 NOTE — Assessment & Plan Note (Signed)
Anticipatory guidance --- weight loss, exercise. Pap smear obtained. Refer for mammogram.  Hemosure obtained.  Immunizations UTD.  Obtain labs.

## 2012-06-28 ENCOUNTER — Encounter: Payer: Self-pay | Admitting: Family Medicine

## 2012-07-02 ENCOUNTER — Encounter: Payer: Self-pay | Admitting: Family Medicine

## 2012-07-02 MED ORDER — SERTRALINE HCL 100 MG PO TABS: 100.0000 mg | ORAL_TABLET | Freq: Every day | ORAL | Status: DC

## 2012-07-09 ENCOUNTER — Telehealth: Payer: Self-pay | Admitting: Family Medicine

## 2012-07-09 DIAGNOSIS — B977 Papillomavirus as the cause of diseases classified elsewhere: Secondary | ICD-10-CM

## 2012-07-09 NOTE — Telephone Encounter (Signed)
Patient sent MyChart Message agreeable to gynecology referral for persistent HPV on pap smear with normal pap otherwise.  Will place gynecology referral

## 2012-07-16 ENCOUNTER — Ambulatory Visit: Payer: Self-pay

## 2012-09-12 ENCOUNTER — Telehealth: Payer: Self-pay

## 2012-09-12 DIAGNOSIS — F418 Other specified anxiety disorders: Secondary | ICD-10-CM

## 2012-09-12 MED ORDER — BUPROPION HCL ER (XL) 150 MG PO TB24: 150.0000 mg | ORAL_TABLET | Freq: Every day | ORAL | Status: DC

## 2012-09-12 NOTE — Telephone Encounter (Signed)
Pt states that the [facility] on [address] states that they have sent over several rx refill requests for pt's Wellbutrin. Pt would like to know what is going on. Best# [phone]

## 2012-09-12 NOTE — Telephone Encounter (Signed)
Unfortunately I did not receive this request , do you prescribe this for her? It is not in her office note, it is listed as historical in her med list.

## 2012-09-12 NOTE — Telephone Encounter (Signed)
Yes, I prescribe all of her medications.  I sent in refill.  Please advise patient.

## 2012-09-13 ENCOUNTER — Other Ambulatory Visit: Payer: Self-pay | Admitting: Radiology

## 2012-09-14 IMAGING — NM NM RAI THYROID CANCER W/ THYROGEN
1 series · 1 of 1 positions shown · non-contrast
Comparison: Ultrasound 07/20/2010

CLINICAL DATA: Papillary thyroid carcinoma, follicular variant.
Thyroidectomy September 2010.

RADIOACTIVE IODINE THERAPY FOR THYROID CANCER:

[Series 1: st static image · 1.72mm/px · 1 of 1 slices shown]
[im 1/1]
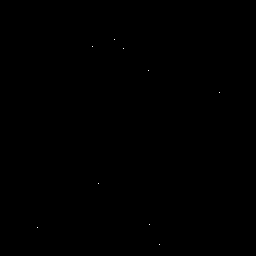

[1 of 1 positions shown; findings below may reference images not displayed]

Procedure:  The risks and benefits of radioactive iodine therapy
were discussed with the patient in detail.  Alternative therapies
were also mentioned.  Radiation safety was discussed with the
patient, including how to protect the general public from exposure.
There were no barriers to communication.  Written consent was
obtained.  The patient then received a capsule containing the
radiopharmaceutical.

The patient will follow-up with the referring physician.

Radiopharmaceutical:  108.5  mCi K-9T9 sodium iodide.
IMPRESSION: Per oral administration of K-9T9 sodium iodide for the treatment of
thyroid cancer.

## 2012-11-05 ENCOUNTER — Telehealth: Payer: Self-pay

## 2012-11-05 MED ORDER — METAXALONE 800 MG PO TABS: 800.0000 mg | ORAL_TABLET | Freq: Three times a day (TID) | ORAL | Status: DC

## 2012-11-05 NOTE — Telephone Encounter (Signed)
Patient notified and voiced understanding.

## 2012-11-05 NOTE — Telephone Encounter (Signed)
Dr. Katrinka Blazing - please advise. I do not see where we have given her this.

## 2012-11-05 NOTE — Telephone Encounter (Signed)
PT WOULD LIKE A REFILL ON HER SKELAXIN SHE WAS GIVEN A WHILE AGO FROM DR Katrinka Blazing. PLEASE CALL [phone]   [facility] ON [address]

## 2012-11-05 NOTE — Telephone Encounter (Signed)
Refill of Skelaxin provided; please advise patient.

## 2013-01-23 ENCOUNTER — Other Ambulatory Visit: Payer: Self-pay

## 2013-01-23 DIAGNOSIS — Z0271 Encounter for disability determination: Secondary | ICD-10-CM

## 2013-01-28 ENCOUNTER — Other Ambulatory Visit: Payer: Self-pay | Admitting: Family Medicine

## 2013-03-03 ENCOUNTER — Other Ambulatory Visit: Payer: Self-pay | Admitting: Family Medicine

## 2013-04-03 ENCOUNTER — Other Ambulatory Visit: Payer: Self-pay | Admitting: Family Medicine

## 2013-04-10 ENCOUNTER — Other Ambulatory Visit: Payer: Self-pay | Admitting: Family Medicine

## 2013-04-11 ENCOUNTER — Telehealth: Payer: Self-pay

## 2013-04-11 MED ORDER — LEVOTHYROXINE SODIUM 137 MCG PO TABS: 137.0000 ug | ORAL_TABLET | Freq: Every day | ORAL | Status: DC

## 2013-04-11 NOTE — Telephone Encounter (Signed)
Pt has appt sched for 05/20/13 so I will send in a RF.

## 2013-04-11 NOTE — Telephone Encounter (Signed)
Spoke to pt- sent in refill for 30 days reminded her of her appt on March 3.

## 2013-04-11 NOTE — Telephone Encounter (Signed)
Patient states that she needs a refill on Levothyroxine. Patient knows that she needs an OV however she wants to know if she can have enough pills to last her until she comes in for an OV with Dr. Tamala Julian on Wednesday morning. Patient also states that she is going out of town at noon today; if we can't send it to her regular pharmacy in time ([facility]) can we send it to [facility] in [place], [place]?  [phone]

## 2013-05-20 ENCOUNTER — Encounter: Payer: Self-pay | Admitting: Family Medicine

## 2013-05-20 ENCOUNTER — Ambulatory Visit (INDEPENDENT_AMBULATORY_CARE_PROVIDER_SITE_OTHER): Payer: BC Managed Care – PPO | Admitting: Family Medicine

## 2013-05-20 VITALS — BP 120/82 | HR 90 | Temp 98.2°F | Resp 16 | Ht 67.5 in | Wt 237.0 lb

## 2013-05-20 DIAGNOSIS — R5381 Other malaise: Secondary | ICD-10-CM

## 2013-05-20 DIAGNOSIS — R0683 Snoring: Secondary | ICD-10-CM | POA: Insufficient documentation

## 2013-05-20 DIAGNOSIS — R87810 Cervical high risk human papillomavirus (HPV) DNA test positive: Secondary | ICD-10-CM | POA: Insufficient documentation

## 2013-05-20 DIAGNOSIS — E7800 Pure hypercholesterolemia, unspecified: Secondary | ICD-10-CM | POA: Insufficient documentation

## 2013-05-20 DIAGNOSIS — R7309 Other abnormal glucose: Secondary | ICD-10-CM

## 2013-05-20 DIAGNOSIS — C73 Malignant neoplasm of thyroid gland: Secondary | ICD-10-CM

## 2013-05-20 DIAGNOSIS — E782 Mixed hyperlipidemia: Secondary | ICD-10-CM

## 2013-05-20 DIAGNOSIS — F418 Other specified anxiety disorders: Secondary | ICD-10-CM | POA: Insufficient documentation

## 2013-05-20 DIAGNOSIS — R490 Dysphonia: Secondary | ICD-10-CM

## 2013-05-20 DIAGNOSIS — G4726 Circadian rhythm sleep disorder, shift work type: Secondary | ICD-10-CM

## 2013-05-20 DIAGNOSIS — R5383 Other fatigue: Secondary | ICD-10-CM

## 2013-05-20 DIAGNOSIS — Z1239 Encounter for other screening for malignant neoplasm of breast: Secondary | ICD-10-CM

## 2013-05-20 DIAGNOSIS — J309 Allergic rhinitis, unspecified: Secondary | ICD-10-CM | POA: Insufficient documentation

## 2013-05-20 LAB — COMPLETE METABOLIC PANEL WITH GFR
ALK PHOS: 97 U/L (ref 39–117)
ALT: 46 U/L — ABNORMAL HIGH (ref 0–35)
AST: 29 U/L (ref 0–37)
Albumin: 4.4 g/dL (ref 3.5–5.2)
BILIRUBIN TOTAL: 0.4 mg/dL (ref 0.2–1.2)
BUN: 14 mg/dL (ref 6–23)
CO2: 27 meq/L (ref 19–32)
CREATININE: 0.8 mg/dL (ref 0.50–1.10)
Calcium: 9.3 mg/dL (ref 8.4–10.5)
Chloride: 105 mEq/L (ref 96–112)
GFR, Est African American: >89 mL/min
GLUCOSE: 98 mg/dL (ref 70–99)
Potassium: 4.5 mEq/L (ref 3.5–5.3)
Sodium: 139 mEq/L (ref 135–145)
Total Protein: 7.2 g/dL (ref 6.0–8.3)

## 2013-05-20 LAB — CBC WITH DIFFERENTIAL/PLATELET
BASOS PCT: 1 % (ref 0–1)
Basophils Absolute: 0.1 10*3/uL (ref 0.0–0.1)
Eosinophils Absolute: 0.8 10*3/uL — ABNORMAL HIGH (ref 0.0–0.7)
Eosinophils Relative: 12 % — ABNORMAL HIGH (ref 0–5)
HCT: 39.1 % (ref 36.0–46.0)
HEMOGLOBIN: 13.5 g/dL (ref 12.0–15.0)
LYMPHS ABS: 1.8 10*3/uL (ref 0.7–4.0)
LYMPHS PCT: 28 % (ref 12–46)
MCH: 29.5 pg (ref 26.0–34.0)
MCHC: 34.5 g/dL (ref 30.0–36.0)
MCV: 85.4 fL (ref 78.0–100.0)
MONOS PCT: 6 % (ref 3–12)
Monocytes Absolute: 0.4 10*3/uL (ref 0.1–1.0)
NEUTROS ABS: 3.4 10*3/uL (ref 1.7–7.7)
Neutrophils Relative %: 53 % (ref 43–77)
Platelets: 200 10*3/uL (ref 150–400)
RBC: 4.58 MIL/uL (ref 3.87–5.11)
RDW: 13.9 % (ref 11.5–15.5)
WBC: 6.5 10*3/uL (ref 4.0–10.5)

## 2013-05-20 LAB — LIPID PANEL
Cholesterol: 140 mg/dL (ref 0–200)
HDL: 29 mg/dL — ABNORMAL LOW (ref >39)
LDL CALC: 84 mg/dL (ref 0–99)
TRIGLYCERIDES: 136 mg/dL (ref <150)
Total CHOL/HDL Ratio: 4.8 Ratio
VLDL: 27 mg/dL (ref 0–40)

## 2013-05-20 LAB — TSH: TSH: 1.718 u[IU]/mL (ref 0.350–4.500)

## 2013-05-20 LAB — HEMOGLOBIN A1C
HEMOGLOBIN A1C: 5.9 % — AB (ref <5.7)
Mean Plasma Glucose: 123 mg/dL — ABNORMAL HIGH (ref <117)

## 2013-05-20 LAB — VITAMIN B12: Vitamin B-12: 340 pg/mL (ref 211–911)

## 2013-05-20 LAB — T4, FREE: Free T4: 1.07 ng/dL (ref 0.80–1.80)

## 2013-05-20 MED ORDER — LEVOTHYROXINE SODIUM 150 MCG PO TABS: 150.0000 ug | ORAL_TABLET | Freq: Every day | ORAL | Status: DC

## 2013-05-20 MED ORDER — BUPROPION HCL ER (XL) 150 MG PO TB24: 150.0000 mg | ORAL_TABLET | Freq: Every day | ORAL | Status: DC

## 2013-05-20 MED ORDER — MODAFINIL 100 MG PO TABS: 100.0000 mg | ORAL_TABLET | Freq: Every day | ORAL | Status: DC

## 2013-05-20 MED ORDER — FLUTICASONE PROPIONATE 50 MCG/ACT NA SUSP: 2.0000 | Freq: Every day | NASAL | Status: DC

## 2013-05-20 MED ORDER — SERTRALINE HCL 100 MG PO TABS: 100.0000 mg | ORAL_TABLET | Freq: Every day | ORAL | Status: DC

## 2013-05-20 NOTE — Progress Notes (Signed)
This chart was scribed for Wardell Honour, MD by Eston Mould, ED Scribe. This patient was seen in room Room/bed 24 and the patient's care was started at 8:30 AM. Subjective:    Patient ID: Taylor Powers, female    DOB: January 29, 1971, 43 y.o.   MRN: [id number]  05/20/2013  Follow-up and Medication Refill  HPI Taylor Powers is a 43 y.o. female who presents to the [facility] for medication refill and F/U appointment for surgical hypothyroid secondary to thyroid cancer, anxiety and depression. Pts last visit to [facility] was 06/2012 for CPE. Pt vitamin D level was low at last year's physical.  Pt states she has been fine but states she has less energy then normal. She states she has been taking her medications as prescribed, states she had 2 week of feeling normal (working in the yard and working out) but states she began to feel less energy. Pt states she has cut off Aspertane and reports eating healthy for a short period of time. Pt states when she ran out of her Thyroid medication, she began feeling weak and having less energy. She states she did not have her medication for 5 days. She states she can go to work but states she feels tired although she has been taking her medications. She denies CP, abd pain, nausea, emesis, chills, diaphoresis, cough, and frequency. Pt states she feels normal when she takes half a pill of Nuvigil. Pt states she generally takes this medication when driving home from work. She states she drives about 1.5 hours. She states she would generally take 2 halves a week and states she feels normal.  She works 16 hour shifts on each weekend day; she sleeps six hours in between 16 hour shifts.  Pt states she is emotionally happy. She states she will be getting married in October and reports living in a nice neighborhood. Pt states her daughter graduated a year early and is in college. Pt states she works at a [employer] in [employer]. She states she loves her job. Pt states  she is currently in school and reports graduating with her BSN soon. Pt states she has plans to attend [employer]. Pt states she works 6 hours, sleep, then 16, sleep, then 6, sleep, then 16 hours over the weekends. She states she does not work 4 days out the week (Monday-Friday).   Pt denies ever having a sleep study done. She states she suspects her thyroid is causing her fatigue/tired. Pt states she snores and states this is due to gaining weight. Pt denies having a mammogram done last year. Pt states she is currently fasting but states she is not fasting from coffee. Pt states she is still smoking.  Pt states she is hoarse. She states this has been going on for the past 8 months. Pt states she has been nasal congested for about 6 months. Pt denies having Flonase but states she has Claritin at home.She states the building she works in is old and believes the building has mold. Pt also complains of having muscle soreness but states she suspects this is due to limited activity.   Pt states she is taking her Wellbutrin and Zoloft as prescribed and states she is doing "fine". She states she is taking her Thyroid medication as prescribed. Pt states she has had a flu shot this season at work.  HPV+ on pap: referred to Dr. Charlesetta Garibaldi; never received appointment but was not motivated to go  to appointment.     Review of Systems  Constitutional: Positive for activity change and fatigue. Negative for fever, chills, diaphoresis and unexpected weight change.  HENT: Positive for congestion, postnasal drip and voice change. Negative for ear pain, rhinorrhea, sinus pressure, sore throat and trouble swallowing.   Respiratory: Negative for cough and shortness of breath.   Cardiovascular: Negative for chest pain and palpitations.  Gastrointestinal: Negative for nausea, vomiting, abdominal pain and diarrhea.  Endocrine: Negative for cold intolerance, heat intolerance, polydipsia, polyphagia  and polyuria.  Genitourinary: Negative for frequency.  Neurological: Negative for dizziness, light-headedness, numbness and headaches.  Psychiatric/Behavioral: Negative for sleep disturbance and dysphoric mood. The patient is not nervous/anxious.     Past Medical History  Diagnosis Date  . Diabetes mellitus   . Thyroid disease   . Insomnia, unspecified   . Hematuria, unspecified   . Absence of menstruation   . Other acne   . Hypersomnia, unspecified     night shift work  . Anxiety state, unspecified   . Tobacco use disorder   . Dysthymic disorder   . Dysplasia of cervix, unspecified   . Allergic rhinitis, cause unspecified   . Mixed hyperlipidemia   . Unspecified vitamin D deficiency   . Other abnormal glucose   . Chicken pox   . Arthritis     DDD L4-5; ruptured disc; laminectomy.  . Cancer 09/18/2010    thyroid cancer; s/p thyroidectomy total; s/p ablation.  [place].   Allergies  Allergen Reactions  . Sulfa Antibiotics Hives    All over body   Current Outpatient Prescriptions  Medication Sig Dispense Refill  . Armodafinil (NUVIGIL) 150 MG tablet Take 150 mg by mouth daily. Take one hour prior to shift.      Marland Kitchen atorvastatin (LIPITOR) 10 MG tablet Take 1 tablet (10 mg total) by mouth daily.  30 tablet  5  . buPROPion (WELLBUTRIN XL) 150 MG 24 hr tablet Take 1 tablet (150 mg total) by mouth daily.  90 tablet  1  . levothyroxine (SYNTHROID, LEVOTHROID) 150 MCG tablet Take 1 tablet (150 mcg total) by mouth daily. PATIENT NEEDS OFFICE VISIT FOR ADDITIONAL REFILLS  30 tablet  5  . metaxalone (SKELAXIN) 800 MG tablet Take 1 tablet (800 mg total) by mouth 3 (three) times daily.  60 tablet  1  . sertraline (ZOLOFT) 100 MG tablet Take 1 tablet (100 mg total) by mouth daily.  90 tablet  1  . fluticasone (FLONASE) 50 MCG/ACT nasal spray Place 2 sprays into both nostrils daily.  16 g  11  . modafinil (PROVIGIL) 100 MG tablet Take 1 tablet (100 mg total) by mouth daily.  30 tablet  2    No current facility-administered medications for this visit.       Objective:    BP 120/82  Pulse 90  Temp(Src) 98.2 F (36.8 C)  Resp 16  Ht 5' 7.5" (1.715 m)  Wt 237 lb (107.502 kg)  BMI 36.55 kg/m2  SpO2 95%  LMP 03/05/2013  Physical Exam  Nursing note and vitals reviewed. Constitutional: She is oriented to person, place, and time. She appears well-developed and well-nourished. No distress.  HENT:  Head: Normocephalic and atraumatic.  Right Ear: External ear normal.  Left Ear: External ear normal.  Mouth/Throat: Oropharynx is clear and moist.  Drainage in nose bilaterally.  Eyes: Conjunctivae and EOM are normal. Pupils are equal, round, and reactive to light.  Neck: Normal range of motion. Neck supple. No JVD present.  No thyromegaly present.  Cardiovascular: Normal rate, regular rhythm and normal heart sounds.  Exam reveals no gallop and no friction rub.   No murmur heard. Pulmonary/Chest: Effort normal and breath sounds normal. No stridor. No respiratory distress. She has no wheezes. She has no rales.  Abdominal: Soft. Bowel sounds are normal. She exhibits no distension and no mass. There is no tenderness. There is no rebound and no guarding.  Musculoskeletal: Normal range of motion. She exhibits no tenderness.  Neurological: She is alert and oriented to person, place, and time. She has normal reflexes.  Skin: Skin is warm and dry. No rash noted. She is not diaphoretic.  Psychiatric: She has a normal mood and affect. Her behavior is normal. Judgment and thought content normal.   Results for orders placed in visit on 06/18/12  VITAMIN D 25 HYDROXY      Result Value Ref Range   Vit D, 25-Hydroxy 27 (*) 30 - 89 ng/mL  FOLATE      Result Value Ref Range   Folate >20.0    T4, FREE      Result Value Ref Range   Free T4 0.94  0.80 - 1.80 ng/dL  TSH      Result Value Ref Range   TSH 33.994 (*) 0.350 - 4.500 uIU/mL  VITAMIN B12      Result Value Ref Range   Vitamin  B-12 486  211 - 911 pg/mL  CBC WITH DIFFERENTIAL      Result Value Ref Range   WBC 6.1  4.0 - 10.5 K/uL   RBC 4.38  3.87 - 5.11 MIL/uL   Hemoglobin 12.7  12.0 - 15.0 g/dL   HCT 36.4  36.0 - 46.0 %   MCV 83.1  78.0 - 100.0 fL   MCH 29.0  26.0 - 34.0 pg   MCHC 34.9  30.0 - 36.0 g/dL   RDW 13.5  11.5 - 15.5 %   Platelets 213  150 - 400 K/uL   Neutrophils Relative % 61  43 - 77 %   Neutro Abs 3.8  1.7 - 7.7 K/uL   Lymphocytes Relative 28  12 - 46 %   Lymphs Abs 1.7  0.7 - 4.0 K/uL   Monocytes Relative 7  3 - 12 %   Monocytes Absolute 0.4  0.1 - 1.0 K/uL   Eosinophils Relative 3  0 - 5 %   Eosinophils Absolute 0.2  0.0 - 0.7 K/uL   Basophils Relative 1  0 - 1 %   Basophils Absolute 0.0  0.0 - 0.1 K/uL   Smear Review Criteria for review not met    COMPREHENSIVE METABOLIC PANEL      Result Value Ref Range   Sodium 142  135 - 145 mEq/L   Potassium 4.2  3.5 - 5.3 mEq/L   Chloride 103  96 - 112 mEq/L   CO2 28  19 - 32 mEq/L   Glucose, Bld 73  70 - 99 mg/dL   BUN 10  6 - 23 mg/dL   Creat 0.78  0.50 - 1.10 mg/dL   Total Bilirubin 0.4  0.3 - 1.2 mg/dL   Alkaline Phosphatase 81  39 - 117 U/L   AST 19  0 - 37 U/L   ALT 28  0 - 35 U/L   Total Protein 7.1  6.0 - 8.3 g/dL   Albumin 4.2  3.5 - 5.2 g/dL   Calcium 8.9  8.4 - 10.5 mg/dL  CK  Result Value Ref Range   Total CK 39  7 - 177 U/L  HEMOGLOBIN A1C      Result Value Ref Range   Hemoglobin A1C 5.4  <5.7 %   Mean Plasma Glucose 108  <117 mg/dL  LIPID PANEL      Result Value Ref Range   Cholesterol 125  0 - 200 mg/dL   Triglycerides 144  <150 mg/dL   HDL 26 (*) >39 mg/dL   Total CHOL/HDL Ratio 4.8     VLDL 29  0 - 40 mg/dL   LDL Cholesterol 70  0 - 99 mg/dL  IFOBT (OCCULT BLOOD)      Result Value Ref Range   IFOBT Negative    PAP IG AND HPV HIGH-RISK      Result Value Ref Range   HPV DNA High Risk Detected (*)    Specimen adequacy:       FINAL DIAGNOSIS:       Cytotechnologist:           Assessment & Plan:    Depression with anxiety - Plan: buPROPion (WELLBUTRIN XL) 150 MG 24 hr tablet  Breast cancer screening - Plan: MM Digital Screening, COMPLETE METABOLIC PANEL WITH GFR, CBC with Differential  Cancer of thyroid T3N0 S/P thyroidectomy 10/07/10 - Plan: TSH, T4, Free  Allergic rhinitis, cause unspecified  Other malaise and fatigue - Plan: Vitamin B12, COMPLETE METABOLIC PANEL WITH GFR, CBC with Differential  Shift work sleep disorder - Plan: Vitamin B12  Snoring - Plan: COMPLETE METABOLIC PANEL WITH GFR, CBC with Differential  Pure hypercholesterolemia - Plan: Lipid panel, COMPLETE METABOLIC PANEL WITH GFR, CBC with Differential  Other abnormal glucose - Plan: Hemoglobin A1c  Hoarseness  Cervical high risk HPV (human papillomavirus) test positive  1.  Depression with anxiety: controlled; refills provided.  No stressors contributing to fatigue. 2.  Breast cancer screening: non-compliance with mammogram in 2014; referral placed again. 3.  Thyroid cancer: stable; s/p thyroidectomy; obtain labs.   4.  Allergic Rhinitis: uncontrolled; likely cause of hoarseness; rx for flonase provided.  Recommend starting Claritin one daily at home; follow-up for CPE in one month; will confirm resolution. 5.  Hoarseness:  New.  Secondary to PND; treat with Flonase, Claritin. If persists, refer to ENT due to smoking hx and history of thyroid cancer. 6.  Malaise and fatigue:  New.  Obtain labs including TSH.  If normal, highly recommend sleep study.  Does work shift work on weekends yet likely not cause of fatigue. 7. Shift work sleep disorder: persistent; agreeable to trial of Provigil $RemoveBef'100mg'VAXaNnnySn$  PRN.  Rx provided. 8.  Snoring: Chronic with worsening with weight gain.  If fatigue does not resolve, refer for sleep study. 9.  Hyperlipidemia: stable; compliance with Lipitor; obtain labs. 10.  Glucose intolerance: stable; obtain labs.  Recent weight gain. 11. HPV high risk+:  Persistent; pt non-compliant with referral  to gynecology; agreeable to make appointment.    Meds ordered this encounter  Medications  . fluticasone (FLONASE) 50 MCG/ACT nasal spray    Sig: Place 2 sprays into both nostrils daily.    Dispense:  16 g    Refill:  11  . levothyroxine (SYNTHROID, LEVOTHROID) 150 MCG tablet    Sig: Take 1 tablet (150 mcg total) by mouth daily. PATIENT NEEDS OFFICE VISIT FOR ADDITIONAL REFILLS    Dispense:  30 tablet    Refill:  5  . modafinil (PROVIGIL) 100 MG tablet    Sig: Take 1 tablet (100  mg total) by mouth daily.    Dispense:  30 tablet    Refill:  2  . buPROPion (WELLBUTRIN XL) 150 MG 24 hr tablet    Sig: Take 1 tablet (150 mg total) by mouth daily.    Dispense:  90 tablet    Refill:  1  . sertraline (ZOLOFT) 100 MG tablet    Sig: Take 1 tablet (100 mg total) by mouth daily.    Dispense:  90 tablet    Refill:  1    Order Specific Question:  Supervising Provider    Answer:  DOOLITTLE, ROBERT P [[id number]]     Meds ordered this encounter  Medications  . fluticasone (FLONASE) 50 MCG/ACT nasal spray    Sig: Place 2 sprays into both nostrils daily.    Dispense:  16 g    Refill:  11  . levothyroxine (SYNTHROID, LEVOTHROID) 150 MCG tablet    Sig: Take 1 tablet (150 mcg total) by mouth daily. PATIENT NEEDS OFFICE VISIT FOR ADDITIONAL REFILLS    Dispense:  30 tablet    Refill:  5  . modafinil (PROVIGIL) 100 MG tablet    Sig: Take 1 tablet (100 mg total) by mouth daily.    Dispense:  30 tablet    Refill:  2  . buPROPion (WELLBUTRIN XL) 150 MG 24 hr tablet    Sig: Take 1 tablet (150 mg total) by mouth daily.    Dispense:  90 tablet    Refill:  1  . sertraline (ZOLOFT) 100 MG tablet    Sig: Take 1 tablet (100 mg total) by mouth daily.    Dispense:  90 tablet    Refill:  1    Order Specific Question:  Supervising Provider    Answer:  DOOLITTLE, ROBERT P [[id number]]    No Follow-up on file.    I personally performed the services described in this documentation, which was scribed in my  presence.  The recorded information has been reviewed and is accurate.  Reginia Forts, M.D.  [facility] [address] [place], [place]  [address] [phone] phone [phone] fax

## 2013-05-21 ENCOUNTER — Encounter: Payer: Self-pay | Admitting: Family Medicine

## 2013-06-05 ENCOUNTER — Ambulatory Visit: Payer: Self-pay

## 2013-06-06 ENCOUNTER — Other Ambulatory Visit: Payer: Self-pay | Admitting: Physician Assistant

## 2013-06-17 ENCOUNTER — Ambulatory Visit (INDEPENDENT_AMBULATORY_CARE_PROVIDER_SITE_OTHER): Payer: BC Managed Care – PPO | Admitting: Family Medicine

## 2013-06-17 ENCOUNTER — Encounter: Payer: Self-pay | Admitting: Family Medicine

## 2013-06-17 VITALS — BP 117/78 | HR 100 | Temp 98.2°F | Resp 18 | Ht 67.5 in | Wt 233.0 lb

## 2013-06-17 DIAGNOSIS — R945 Abnormal results of liver function studies: Secondary | ICD-10-CM

## 2013-06-17 DIAGNOSIS — Z0000 Encounter for general adult medical examination without abnormal findings: Secondary | ICD-10-CM

## 2013-06-17 DIAGNOSIS — IMO0002 Reserved for concepts with insufficient information to code with codable children: Secondary | ICD-10-CM

## 2013-06-17 DIAGNOSIS — N926 Irregular menstruation, unspecified: Secondary | ICD-10-CM

## 2013-06-17 DIAGNOSIS — R7989 Other specified abnormal findings of blood chemistry: Secondary | ICD-10-CM

## 2013-06-17 LAB — POCT UA - MICROSCOPIC ONLY
CASTS, UR, LPF, POC: NEGATIVE
Crystals, Ur, HPF, POC: NEGATIVE
Mucus, UA: POSITIVE
Yeast, UA: NEGATIVE

## 2013-06-17 LAB — POCT URINALYSIS DIPSTICK
BILIRUBIN UA: NEGATIVE
GLUCOSE UA: NEGATIVE
Ketones, UA: NEGATIVE
Nitrite, UA: NEGATIVE
PH UA: 6
Protein, UA: NEGATIVE
Urobilinogen, UA: 0.2

## 2013-06-17 LAB — COMPLETE METABOLIC PANEL WITH GFR
ALK PHOS: 93 U/L (ref 39–117)
ALT: 39 U/L — AB (ref 0–35)
AST: 25 U/L (ref 0–37)
Albumin: 4.4 g/dL (ref 3.5–5.2)
BILIRUBIN TOTAL: 0.3 mg/dL (ref 0.2–1.2)
BUN: 13 mg/dL (ref 6–23)
CO2: 25 meq/L (ref 19–32)
Calcium: 9.3 mg/dL (ref 8.4–10.5)
Chloride: 104 mEq/L (ref 96–112)
Creat: 0.8 mg/dL (ref 0.50–1.10)
GFR, Est Non African American: >89 mL/min
GLUCOSE: 95 mg/dL (ref 70–99)
POTASSIUM: 4.3 meq/L (ref 3.5–5.3)
SODIUM: 140 meq/L (ref 135–145)
TOTAL PROTEIN: 7.1 g/dL (ref 6.0–8.3)

## 2013-06-17 LAB — LUTEINIZING HORMONE: LH: 26.8 m[IU]/mL

## 2013-06-17 LAB — FOLLICLE STIMULATING HORMONE: FSH: 48.5 m[IU]/mL

## 2013-06-17 NOTE — Progress Notes (Signed)
   Subjective:    Patient ID: Taylor Powers, female    DOB: Jul 15, 1970, 43 y.o.   MRN: [id number]  HPI    Review of Systems  Constitutional: Positive for activity change and fatigue.  HENT: Positive for postnasal drip, rhinorrhea and voice change.   Musculoskeletal: Positive for arthralgias.  Allergic/Immunologic: Positive for environmental allergies.       Objective:   Physical Exam        Assessment & Plan:

## 2013-06-17 NOTE — Progress Notes (Signed)
Subjective:  This chart was scribed for Dr. Reginia Forts, MD by Stacy Gardner, [facility] Scribe. The patient was seen in room and the patient's care was started at 1:25 PM.   Patient ID: Taylor Powers, female    DOB: 1970/04/01, 43 y.o.   MRN: [id number]  06/17/2013  Annual Exam   HPI HPI Comments: Taylor Powers is a 43 y.o. female who arrives to the [facility] for an annual exam.  She reports that her vision is "going" and she attributes this to getting old.  Pt has mild numbness and tingling to her fingertips that is worse with compression. Denies numbness and tingling to her feet. Denies tinnitus, headaches. Denies chest pain and chest palpation. Denies neck pain.  She regularly checks her breast for masses and denies feeling any abnormalities.   Pt feels great and works in the yard regularly. Pt mentions having improvement of her fatigue and feels more energized after taking the medication Rx during her last visit. Pt was seen for hoarseness which she reports that it has drastically improved. She drinks diet pepsi and vitamin water. Pt mentions Pepsi makes her joints hurt.  She reports vaginal spotting today and denies any associated pain. Her prior menses was last year after a period of not having one for the past year.  Mother and grandmother went through early menopause.  Denies hot flashes. She denies urinary incontinence and urgency.  Pt reports her urine smells like dirt.  Denies hematochezia, irregular BM and constipation   She is taking steps to lose weight and is trying to lose 25 lbs.   Pt has been in a committed relationship for the past 8 years and looks forward to getting married.  She smokes a .5 pack of cigarettes. Denies smoker's cough and SOB.   Health Maintenance: Pt's last pap smear was 06/2012. HPV+ 2013, 2014.    Pt's mammogram was 2006.  She has never had a colonoscopy.  Pt's tetanus shot/tDAP was 2012. Pt's last  physical was last year.  She  Pt's mammogram is next week. Her hepatitis B vaccination was through her employment. Pt has a pneumonia shot last year.  Denies visit to the ophthalmologist.  Denies visit to the dentist.   Pt denies surgery recently. She is not ready for a sleep study to be performed.   Pt's mother had a for persistent kidney stones. Denies hx of MI and heart stents. Pt's immediately family is in good health. She reports that her daughter moved out of her home and reports she had to move out because she was driving her crazy.    Review of Systems  Constitutional: Negative for chills, diaphoresis, activity change, appetite change, fatigue and unexpected weight change.  HENT: Positive for postnasal drip, rhinorrhea, sneezing and voice change. Negative for hearing loss, sinus pressure, sore throat and tinnitus.   Eyes: Negative for photophobia, redness and visual disturbance.  Respiratory: Negative for cough, shortness of breath, wheezing and stridor.   Cardiovascular: Negative for chest pain, palpitations and leg swelling.  Gastrointestinal: Negative for nausea, vomiting, abdominal pain, diarrhea, constipation, blood in stool, abdominal distention, anal bleeding and rectal pain.  Endocrine: Negative for cold intolerance, heat intolerance, polydipsia, polyphagia and polyuria.  Genitourinary: Positive for vaginal bleeding and menstrual problem. Negative for dysuria, urgency, hematuria, vaginal discharge, genital sores, vaginal pain and pelvic pain.  Musculoskeletal: Negative for arthralgias, back pain, gait problem, joint swelling, myalgias, neck pain and neck stiffness.  Neurological: Positive for numbness. Negative for tremors, syncope, facial asymmetry, speech difficulty, weakness and headaches.  Hematological: Negative for adenopathy. Does not bruise/bleed easily.  Psychiatric/Behavioral: Negative for suicidal ideas, sleep disturbance, self-injury and dysphoric mood. The patient  is not nervous/anxious and is not hyperactive.   All other systems reviewed and are negative.    Past Medical History  Diagnosis Date  . Diabetes mellitus   . Thyroid disease   . Insomnia, unspecified   . Hematuria, unspecified   . Absence of menstruation   . Other acne   . Hypersomnia, unspecified     night shift work  . Anxiety state, unspecified   . Tobacco use disorder   . Dysthymic disorder   . Dysplasia of cervix, unspecified   . Allergic rhinitis, cause unspecified   . Mixed hyperlipidemia   . Unspecified vitamin D deficiency   . Other abnormal glucose   . Chicken pox   . Arthritis     DDD L4-5; ruptured disc; laminectomy.  . Cancer 09/18/2010    thyroid cancer; s/p thyroidectomy total; s/p ablation.  [facility].   Allergies  Allergen Reactions  . Sulfa Antibiotics Hives    All over body   Current Outpatient Prescriptions  Medication Sig Dispense Refill  . Armodafinil (NUVIGIL) 150 MG tablet Take 150 mg by mouth daily. Take one hour prior to shift.      Marland Kitchen atorvastatin (LIPITOR) 10 MG tablet TAKE ONE TABLET BY MOUTH DAILY  30 tablet  0  . buPROPion (WELLBUTRIN XL) 150 MG 24 hr tablet Take 1 tablet (150 mg total) by mouth daily.  90 tablet  1  . fluticasone (FLONASE) 50 MCG/ACT nasal spray Place 2 sprays into both nostrils daily.  16 g  11  . levothyroxine (SYNTHROID, LEVOTHROID) 150 MCG tablet Take 1 tablet (150 mcg total) by mouth daily. PATIENT NEEDS OFFICE VISIT FOR ADDITIONAL REFILLS  30 tablet  5  . metaxalone (SKELAXIN) 800 MG tablet Take 1 tablet (800 mg total) by mouth 3 (three) times daily.  60 tablet  1  . sertraline (ZOLOFT) 100 MG tablet Take 1 tablet (100 mg total) by mouth daily.  90 tablet  1   No current facility-administered medications for this visit.       Objective:    BP 117/78  Pulse 100  Temp(Src) 98.2 F (36.8 C)  Resp 18  Ht 5' 7.5" (1.715 m)  Wt 233 lb (105.688 kg)  BMI 35.93 kg/m2  SpO2 99%  LMP 03/05/2013 Physical Exam    Nursing note and vitals reviewed. Constitutional: She is oriented to person, place, and time. She appears well-developed and well-nourished. No distress.  HENT:  Head: Normocephalic and atraumatic.  Right Ear: External ear normal.  Left Ear: External ear normal.  Nose: Nose normal.  Mouth/Throat: Oropharynx is clear and moist. No oropharyngeal exudate.  Eyes: Conjunctivae and EOM are normal. Pupils are equal, round, and reactive to light.  Neck: Normal range of motion and full passive range of motion without pain. Neck supple. No JVD present. Carotid bruit is not present. No thyromegaly present.  Cardiovascular: Normal rate, regular rhythm and normal heart sounds.  Exam reveals no gallop and no friction rub.   No murmur heard. Pulmonary/Chest: Effort normal and breath sounds normal. No respiratory distress. She has no wheezes. She has no rales. She exhibits no tenderness.  Abdominal: Soft. Bowel sounds are normal. She exhibits no distension and no mass. There is no tenderness. There is no rebound and no  guarding.  Genitourinary: No breast swelling, tenderness, discharge or bleeding. Pelvic exam was performed with patient supine.  Musculoskeletal: Normal range of motion. She exhibits no edema and no tenderness.       Right shoulder: Normal.       Left shoulder: Normal.       Cervical back: Normal.  Lymphadenopathy:    She has no cervical adenopathy.  Neurological: She is alert and oriented to person, place, and time. She has normal reflexes. No cranial nerve deficit. She exhibits normal muscle tone. Coordination normal.  Skin: Skin is warm and dry. No rash noted. She is not diaphoretic. No erythema. No pallor.  Psychiatric: She has a normal mood and affect. Her behavior is normal. Judgment and thought content normal.   Results for orders placed in visit on 06/17/13  COMPLETE METABOLIC PANEL WITH GFR      Result Value Ref Range   Sodium 140  135 - 145 mEq/L   Potassium 4.3  3.5 - 5.3  mEq/L   Chloride 104  96 - 112 mEq/L   CO2 25  19 - 32 mEq/L   Glucose, Bld 95  70 - 99 mg/dL   BUN 13  6 - 23 mg/dL   Creat 0.80  0.50 - 1.10 mg/dL   Total Bilirubin 0.3  0.2 - 1.2 mg/dL   Alkaline Phosphatase 93  39 - 117 U/L   AST 25  0 - 37 U/L   ALT 39 (*) 0 - 35 U/L   Total Protein 7.1  6.0 - 8.3 g/dL   Albumin 4.4  3.5 - 5.2 g/dL   Calcium 9.3  8.4 - 10.5 mg/dL   GFR, Est African American >89     GFR, Est Non African American >85    FOLLICLE STIMULATING HORMONE      Result Value Ref Range   FSH 48.5    LUTEINIZING HORMONE      Result Value Ref Range   LH 26.8    POCT URINALYSIS DIPSTICK      Result Value Ref Range   Color, UA yellow     Clarity, UA clear     Glucose, UA neg     Bilirubin, UA neg     Ketones, UA neg     Spec Grav, UA >=1.030     Blood, UA large     pH, UA 6.0     Protein, UA neg     Urobilinogen, UA 0.2     Nitrite, UA neg     Leukocytes, UA small (1+)    POCT UA - MICROSCOPIC ONLY      Result Value Ref Range   WBC, Ur, HPF, POC 13-16     RBC, urine, microscopic 14-18     Bacteria, U Microscopic 2+     Mucus, UA pos     Epithelial cells, urine per micros 4-5     Crystals, Ur, HPF, POC neg     Casts, Ur, LPF, POC neg     Yeast, UA neg         Assessment & Plan:  Routine general medical examination at a health care facility - Plan: POCT urinalysis dipstick, POCT UA - Microscopic Only, CANCELED: EKG 12-Lead  Elevated LFTs - Plan: COMPLETE METABOLIC PANEL WITH GFR  Irregular menses - Plan: Follicle Stimulating Hormone, Luteinizing hormone  HPV test positive - Plan: Ambulatory referral to Gynecology    1. Complete Physical Examination: anticipatory guidance --- weight loss, exercise, smoking cessation.  Pap smear to be performed by gynecology; appointment next week for mammogram.  Immunizations UTD.  Obtain labs. 2. Elevated LFTs:  New. Repeat today; if persists, hold statin for three months and repeat. 3.  HPV+: persistent in 2014;  non-compliance with gynecology consultation in 2014; pt has been referred to gynecology again. 4.  Irregular menses: obtain FSH and LH: onset of menses after no menses for more than one year; warrants endometrial biopsy as well; refer to gynecology. 5.  Hoarseness: improved with treatment of allergic rhinitis. 6.  Allergic Rhinitis: improved with current treatment. 7. Hyperlipidemia: controlled on medication. 8. Depression with anxiety: controlled on current medication. 9. Fatigue: improved; pt declined referral for sleep study; desires to work on weight loss and exercise. 10. Obesity: persistent; pt desires to work on weight loss and exercise; follow-up in three months. 1. Thyroid cancer: stable; increased Synthroid at last visit; will warrant repeat labs in three months.  Discussed course of care with pt which includes a referral to a gynecologist . Advised pt to have a pneumonia shot but she does not feel that it is necessary. I cancelled her EKG.  Pt understands and agrees.   No orders of the defined types were placed in this encounter.    Return in about 3 months (around 09/16/2013) for recheck.  I personally performed the services described in this documentation, which was scribed in my presence.  The recorded information has been reviewed and is accurate.    Reginia Forts, M.D.  [facility] [address] [place], [place]  [address] [phone] phone [phone] fax

## 2013-06-19 ENCOUNTER — Encounter: Payer: Self-pay | Admitting: Family Medicine

## 2013-06-19 DIAGNOSIS — R7989 Other specified abnormal findings of blood chemistry: Secondary | ICD-10-CM

## 2013-06-19 DIAGNOSIS — R7302 Impaired glucose tolerance (oral): Secondary | ICD-10-CM

## 2013-06-19 DIAGNOSIS — E7800 Pure hypercholesterolemia, unspecified: Secondary | ICD-10-CM

## 2013-06-19 DIAGNOSIS — E890 Postprocedural hypothyroidism: Secondary | ICD-10-CM

## 2013-06-19 DIAGNOSIS — R945 Abnormal results of liver function studies: Secondary | ICD-10-CM

## 2013-06-26 ENCOUNTER — Ambulatory Visit
Admission: RE | Admit: 2013-06-26 | Discharge: 2013-06-26 | Disposition: A | Discharge status: Home / Self Care | Payer: BC Managed Care – PPO | Source: Ambulatory Visit | Attending: Family Medicine | Admitting: Family Medicine

## 2013-06-26 DIAGNOSIS — Z1231 Encounter for screening mammogram for malignant neoplasm of breast: Secondary | ICD-10-CM

## 2013-07-03 ENCOUNTER — Other Ambulatory Visit: Payer: Self-pay | Admitting: Family Medicine

## 2013-07-03 DIAGNOSIS — R928 Other abnormal and inconclusive findings on diagnostic imaging of breast: Secondary | ICD-10-CM

## 2013-07-14 ENCOUNTER — Ambulatory Visit
Admission: RE | Admit: 2013-07-14 | Discharge: 2013-07-14 | Disposition: A | Discharge status: Home / Self Care | Payer: BC Managed Care – PPO | Source: Ambulatory Visit | Attending: Family Medicine | Admitting: Family Medicine

## 2013-07-14 ENCOUNTER — Other Ambulatory Visit: Payer: Self-pay | Admitting: Family Medicine

## 2013-07-14 DIAGNOSIS — R928 Other abnormal and inconclusive findings on diagnostic imaging of breast: Secondary | ICD-10-CM

## 2013-07-14 DIAGNOSIS — N6310 Unspecified lump in the right breast, unspecified quadrant: Secondary | ICD-10-CM

## 2013-07-16 ENCOUNTER — Ambulatory Visit
Admission: RE | Admit: 2013-07-16 | Discharge: 2013-07-16 | Disposition: A | Discharge status: Home / Self Care | Payer: BC Managed Care – PPO | Source: Ambulatory Visit | Attending: Family Medicine | Admitting: Family Medicine

## 2013-07-16 ENCOUNTER — Other Ambulatory Visit: Payer: Self-pay | Admitting: Family Medicine

## 2013-07-16 DIAGNOSIS — N6310 Unspecified lump in the right breast, unspecified quadrant: Secondary | ICD-10-CM

## 2013-07-16 DIAGNOSIS — R928 Other abnormal and inconclusive findings on diagnostic imaging of breast: Secondary | ICD-10-CM

## 2013-09-17 ENCOUNTER — Ambulatory Visit (INDEPENDENT_AMBULATORY_CARE_PROVIDER_SITE_OTHER): Payer: BC Managed Care – PPO | Admitting: Family Medicine

## 2013-09-17 ENCOUNTER — Encounter: Payer: Self-pay | Admitting: Family Medicine

## 2013-09-17 VITALS — BP 118/82 | HR 82 | Temp 98.8°F | Resp 16 | Ht 67.0 in | Wt 239.2 lb

## 2013-09-17 DIAGNOSIS — R319 Hematuria, unspecified: Secondary | ICD-10-CM

## 2013-09-17 DIAGNOSIS — R7989 Other specified abnormal findings of blood chemistry: Secondary | ICD-10-CM

## 2013-09-17 DIAGNOSIS — D249 Benign neoplasm of unspecified breast: Secondary | ICD-10-CM

## 2013-09-17 DIAGNOSIS — N950 Postmenopausal bleeding: Secondary | ICD-10-CM

## 2013-09-17 DIAGNOSIS — R87810 Cervical high risk human papillomavirus (HPV) DNA test positive: Secondary | ICD-10-CM

## 2013-09-17 DIAGNOSIS — C73 Malignant neoplasm of thyroid gland: Secondary | ICD-10-CM

## 2013-09-17 DIAGNOSIS — R945 Abnormal results of liver function studies: Secondary | ICD-10-CM

## 2013-09-17 LAB — POCT UA - MICROSCOPIC ONLY
Casts, Ur, LPF, POC: NEGATIVE
Crystals, Ur, HPF, POC: NEGATIVE
MUCUS UA: NEGATIVE
WBC, Ur, HPF, POC: NEGATIVE
Yeast, UA: NEGATIVE

## 2013-09-17 LAB — POCT URINALYSIS DIPSTICK
BILIRUBIN UA: NEGATIVE
Glucose, UA: NEGATIVE
KETONES UA: NEGATIVE
LEUKOCYTES UA: NEGATIVE
Nitrite, UA: NEGATIVE
PH UA: 6.5
Protein, UA: NEGATIVE
Spec Grav, UA: 1.02
Urobilinogen, UA: 0.2

## 2013-09-17 LAB — CBC WITH DIFFERENTIAL/PLATELET
BASOS PCT: 1 % (ref 0–1)
Basophils Absolute: 0.1 10*3/uL (ref 0.0–0.1)
EOS ABS: 0.3 10*3/uL (ref 0.0–0.7)
EOS PCT: 5 % (ref 0–5)
HEMATOCRIT: 37 % (ref 36.0–46.0)
HEMOGLOBIN: 12.7 g/dL (ref 12.0–15.0)
Lymphocytes Relative: 30 % (ref 12–46)
Lymphs Abs: 1.7 10*3/uL (ref 0.7–4.0)
MCH: 28.9 pg (ref 26.0–34.0)
MCHC: 34.3 g/dL (ref 30.0–36.0)
MCV: 84.3 fL (ref 78.0–100.0)
MONO ABS: 0.3 10*3/uL (ref 0.1–1.0)
MONOS PCT: 5 % (ref 3–12)
NEUTROS PCT: 59 % (ref 43–77)
Neutro Abs: 3.2 10*3/uL (ref 1.7–7.7)
Platelets: 195 10*3/uL (ref 150–400)
RBC: 4.39 MIL/uL (ref 3.87–5.11)
RDW: 13.4 % (ref 11.5–15.5)
WBC: 5.5 10*3/uL (ref 4.0–10.5)

## 2013-09-17 LAB — COMPREHENSIVE METABOLIC PANEL
ALBUMIN: 4.1 g/dL (ref 3.5–5.2)
ALT: 36 U/L — ABNORMAL HIGH (ref 0–35)
AST: 22 U/L (ref 0–37)
Alkaline Phosphatase: 86 U/L (ref 39–117)
BUN: 12 mg/dL (ref 6–23)
CO2: 25 mEq/L (ref 19–32)
Calcium: 9.1 mg/dL (ref 8.4–10.5)
Chloride: 105 mEq/L (ref 96–112)
Creat: 0.8 mg/dL (ref 0.50–1.10)
Glucose, Bld: 91 mg/dL (ref 70–99)
Potassium: 4.3 mEq/L (ref 3.5–5.3)
Sodium: 139 mEq/L (ref 135–145)
TOTAL PROTEIN: 6.7 g/dL (ref 6.0–8.3)
Total Bilirubin: 0.4 mg/dL (ref 0.2–1.2)

## 2013-09-17 MED ORDER — METAXALONE 800 MG PO TABS: 800.0000 mg | ORAL_TABLET | Freq: Three times a day (TID) | ORAL | Status: DC

## 2013-09-17 NOTE — Progress Notes (Signed)
Subjective:  This chart was scribed for  Reginia Forts, MD  by Stacy Gardner, [facility] Scribe. The patient was seen in room and the patient's care was started at 11:38 AM.   Patient ID: Taylor Powers, female    DOB: 09/08/1970, 43 y.o.   MRN: [id number]  09/17/2013  Follow-up, Hematuria and Hypothyroidism   HPI HPI Comments: Taylor Powers is a 43 y.o. female who [facility] for 3 month follow up. She had a physical exam in March. She is following up today to check on her thyroid function. She has a hx of thyroid cancer.   At her last visit her liver function was elevated. Pt was advised to refrain from taking Lipitor.   She is schedule to have a repeat of her thyroid function today.   Pt requests a three month prescription for Skelaxin 800 mg. Pt mentions having great results with changes in her thyroid medications. She did not have the medication this morning because she ran out of medication. However, pt mentions that her medication refill is scheduled to arrive today.   She has hx of HPV with normal pap smear for the past two years. Pt was seen at last visit for post menopausal bleeding. Pt went to GYN, Dr Charlesetta Garibaldi recently and had a pelvic and pap exam. Pt is later scheduled to have a colposcopy for the HPV complications and an endometrial biopsy for the post menopausal bleeding complaint.  Denies hematuria. Denies vaginal bleeding.   Pt had an abnormal mammogram and a right breast biopsy. She had a fibroadenoma of her right breast that was non cancerous.   She has a red, itching rash to her right medial,  lower leg with source contact to poison ivy. The rash is gradually resolving.   Pt states she is not ready for a sleep study.  Denies hx of kidney stone She is still taking courses for school.    Review of Systems  Constitutional: Negative for fever, chills, diaphoresis, activity change, appetite change, fatigue and unexpected  weight change.  Respiratory: Negative for shortness of breath and stridor.   Cardiovascular: Negative for palpitations and leg swelling.  Gastrointestinal: Negative for abdominal pain.  Endocrine: Negative for cold intolerance, heat intolerance, polydipsia, polyphagia and polyuria.  Genitourinary: Negative for dysuria, frequency, hematuria, flank pain, vaginal bleeding, vaginal discharge, genital sores, vaginal pain and pelvic pain.  Skin: Positive for color change and rash.  Neurological: Negative for dizziness, light-headedness, numbness and headaches.  Psychiatric/Behavioral: Negative for dysphoric mood. The patient is not nervous/anxious.     Past Medical History  Diagnosis Date   Diabetes mellitus    Thyroid disease    Insomnia, unspecified    Hematuria, unspecified    Absence of menstruation    Other acne    Hypersomnia, unspecified     night shift work   Anxiety state, unspecified    Tobacco use disorder    Dysthymic disorder    Dysplasia of cervix, unspecified    Allergic rhinitis, cause unspecified    Mixed hyperlipidemia    Unspecified vitamin D deficiency    Other abnormal glucose    Chicken pox    Arthritis     DDD L4-5; ruptured disc; laminectomy.   Cancer 09/18/2010    thyroid cancer; s/p thyroidectomy total; s/p ablation.  [facility].   Past Surgical History  Procedure Laterality Date   Laminectomy  1996    L4-L5   Tubal  ligation  1998   Total thyroidectomy  2012   Lipoma of shoulder  2005    Right . removed   History of leep  2005   Spine surgery  03/20/1994    L4-L5 laminectomy    Allergies  Allergen Reactions   Sulfa Antibiotics Hives    All over body   Current Outpatient Prescriptions  Medication Sig Dispense Refill   Armodafinil (NUVIGIL) 150 MG tablet Take 150 mg by mouth daily. Take one hour prior to shift.       buPROPion (WELLBUTRIN XL) 150 MG 24 hr tablet Take 1 tablet (150 mg total) by mouth daily.  90 tablet  1     fluticasone (FLONASE) 50 MCG/ACT nasal spray Place 2 sprays into both nostrils daily.  16 g  11   levothyroxine (SYNTHROID, LEVOTHROID) 150 MCG tablet Take 1 tablet (150 mcg total) by mouth daily. PATIENT NEEDS OFFICE VISIT FOR ADDITIONAL REFILLS  30 tablet  5   metaxalone (SKELAXIN) 800 MG tablet Take 1 tablet (800 mg total) by mouth 3 (three) times daily.  60 tablet  1   sertraline (ZOLOFT) 100 MG tablet Take 1 tablet (100 mg total) by mouth daily.  90 tablet  1   atorvastatin (LIPITOR) 10 MG tablet TAKE ONE TABLET BY MOUTH DAILY  30 tablet  0   No current facility-administered medications for this visit.   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 Happy, no abuse.      Children:  3 children (20, 18, 16); 1 grandchild.        Lives: with partner, 1 daughters.      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  Two sixteen hour shifts followed by 8 hour shift; taking on line classes for BSN.  Starting [employer] for BSN.      Tobacco: 1 ppd x 26 years.        Alcohol: none      Drugs: none      Exercise:  Started exercising this week; walking.      Caffeine use: diet Pepsi and coffee.      Always uses seat belts.       Smoke alarm and carbon monoxide detector in the home.       No guns.        Objective:    BP 118/82   Pulse 82   Temp(Src) 98.8 F (37.1 C) (Oral)   Resp 16   Ht 5'  7" (1.702 m)   Wt 239 lb 3.2 oz (108.5 kg)   BMI 37.46 kg/m2   SpO2 98%   LMP 03/05/2013 Physical Exam  Nursing note and vitals reviewed. Constitutional: She is oriented to person, place, and time. She appears well-developed and  well-nourished. No distress.  HENT:  Head: Normocephalic and atraumatic.  Eyes: Conjunctivae and EOM are normal. Pupils are equal, round, and reactive to light.  Neck: Normal range of motion. Neck supple. No tracheal deviation present. No thyromegaly present.  Well healed horizontal scar anterior neck.  Cardiovascular: Normal rate, regular rhythm, normal heart sounds and intact distal pulses.  Exam reveals no gallop and no friction rub.   No murmur heard. Pulmonary/Chest: Effort normal and breath sounds normal. No respiratory distress. She has no wheezes. She has no rales.  Abdominal: Soft. Bowel sounds are normal. She exhibits no distension and no mass. There is no tenderness. There is no rebound and no guarding.  Musculoskeletal: Normal range of motion.  Lymphadenopathy:    She has no cervical adenopathy.  Neurological: She is alert and oriented to person, place, and time.  Skin: Skin is warm and dry. Rash noted. She is not diaphoretic.  Well healing rash medial aspect L lower leg.  Psychiatric: She has a normal mood and affect. Her behavior is normal.   Results for orders placed in visit on 09/17/13  POCT URINALYSIS DIPSTICK      Result Value Ref Range   Color, UA yellow     Clarity, UA clear     Glucose, UA neg     Bilirubin, UA neg     Ketones, UA neg     Spec Grav, UA 1.020     Blood, UA mod     pH, UA 6.5     Protein, UA neg     Urobilinogen, UA 0.2     Nitrite, UA neg     Leukocytes, UA Negative    POCT UA - MICROSCOPIC ONLY      Result Value Ref Range   WBC, Ur, HPF, POC neg     RBC, urine, microscopic 6-7     Bacteria, U Microscopic trace     Mucus, UA neg     Epithelial cells, urine per micros 0-1     Crystals, Ur, HPF, POC neg      Casts, Ur, LPF, POC neg     Yeast, UA neg         Assessment & Plan:  Hematuria - Plan: POCT urinalysis dipstick, POCT UA - Microscopic Only  Elevated LFTs - Plan: CBC with Differential, Comprehensive metabolic panel  Cancer of thyroid T3N0 S/P thyroidectomy 10/07/10 - Plan: TSH, T4, free  Cervical high risk HPV (human papillomavirus) test positive  Post-menopausal bleeding  Fibroadenoma of breast determined by biopsy  1. Hematuria:  New at [facility] in 05/2013; persistent blood on urine micro today; tobacco hx; no hx of nephrolithiasis; refer to urology. 2.  Elevated LFT: persistent; has been holding Lipitor x 3 months; repeat LFTs today; if remains elevated, refer to abdominal u/s and GI consultation.   3.  Thyroid cancer with hypothyroidism: stable; repeat labs today; asymptomatic currently; energy level much improved.  4.  HPV + on pap smear with normal pap:  S/p gynecology consultation; to schedule colposcopy. 5.  Post-menopausal bleeding: resolved; s/p gynecological consultation; to schedule endometrial bx.   6. Abnormal mammogram/fibroadenoma breast: new. S/p biopsy that was negative for malignancy.   Meds ordered this encounter  Medications   metaxalone (SKELAXIN) 800 MG tablet    Sig: Take 1 tablet (800 mg total) by mouth 3 (three) times daily.    Dispense:  60 tablet    Refill:  1    Return in about 3 months (around 12/18/2013).  I personally performed the services described in this documentation, which was scribed in my presence.  The recorded information has been reviewed and is accurate.   Reginia Forts, M.D.  [facility] [address] [place], [place]  [address] [phone] phone [phone] fax

## 2013-09-18 LAB — T4, FREE: Free T4: 0.89 ng/dL (ref 0.80–1.80)

## 2013-09-18 LAB — TSH: TSH: 5.623 u[IU]/mL — ABNORMAL HIGH (ref 0.350–4.500)

## 2013-09-25 NOTE — Addendum Note (Signed)
Addended by: Wardell Honour on: 09/25/2013 10:51 AM   Modules accepted: Orders

## 2013-10-06 ENCOUNTER — Other Ambulatory Visit: Payer: BC Managed Care – PPO

## 2013-12-24 ENCOUNTER — Ambulatory Visit: Payer: BC Managed Care – PPO | Admitting: Family Medicine

## 2014-01-15 ENCOUNTER — Other Ambulatory Visit: Payer: Self-pay | Admitting: Family Medicine

## 2014-03-06 ENCOUNTER — Other Ambulatory Visit: Payer: Self-pay | Admitting: Family Medicine

## 2014-04-21 ENCOUNTER — Other Ambulatory Visit: Payer: Self-pay | Admitting: Family Medicine

## 2014-05-07 ENCOUNTER — Other Ambulatory Visit: Payer: Self-pay | Admitting: Physician Assistant

## 2014-05-14 MED ORDER — LEVOTHYROXINE SODIUM 150 MCG PO TABS: 150.0000 ug | ORAL_TABLET | Freq: Every day | ORAL | Status: DC

## 2014-05-27 ENCOUNTER — Other Ambulatory Visit: Payer: Self-pay | Admitting: Family Medicine

## 2014-06-03 ENCOUNTER — Other Ambulatory Visit: Payer: Self-pay | Admitting: Family Medicine

## 2014-06-17 ENCOUNTER — Ambulatory Visit (INDEPENDENT_AMBULATORY_CARE_PROVIDER_SITE_OTHER): Payer: BLUE CROSS/BLUE SHIELD | Admitting: Family Medicine

## 2014-06-17 ENCOUNTER — Encounter: Payer: Self-pay | Admitting: Family Medicine

## 2014-06-17 VITALS — BP 126/78 | HR 89 | Temp 98.3°F | Resp 16 | Ht 67.0 in | Wt 237.4 lb

## 2014-06-17 DIAGNOSIS — R7302 Impaired glucose tolerance (oral): Secondary | ICD-10-CM

## 2014-06-17 DIAGNOSIS — E890 Postprocedural hypothyroidism: Secondary | ICD-10-CM

## 2014-06-17 DIAGNOSIS — E559 Vitamin D deficiency, unspecified: Secondary | ICD-10-CM | POA: Diagnosis not present

## 2014-06-17 DIAGNOSIS — J301 Allergic rhinitis due to pollen: Secondary | ICD-10-CM

## 2014-06-17 DIAGNOSIS — F418 Other specified anxiety disorders: Secondary | ICD-10-CM | POA: Diagnosis not present

## 2014-06-17 DIAGNOSIS — E7800 Pure hypercholesterolemia, unspecified: Secondary | ICD-10-CM

## 2014-06-17 DIAGNOSIS — R319 Hematuria, unspecified: Secondary | ICD-10-CM | POA: Diagnosis not present

## 2014-06-17 DIAGNOSIS — S39012A Strain of muscle, fascia and tendon of lower back, initial encounter: Secondary | ICD-10-CM

## 2014-06-17 DIAGNOSIS — L301 Dyshidrosis [pompholyx]: Secondary | ICD-10-CM | POA: Diagnosis not present

## 2014-06-17 DIAGNOSIS — R7989 Other specified abnormal findings of blood chemistry: Secondary | ICD-10-CM | POA: Diagnosis not present

## 2014-06-17 DIAGNOSIS — R945 Abnormal results of liver function studies: Secondary | ICD-10-CM

## 2014-06-17 MED ORDER — LEVOTHYROXINE SODIUM 150 MCG PO TABS: 150.0000 ug | ORAL_TABLET | Freq: Every day | ORAL | Status: DC

## 2014-06-17 MED ORDER — BUPROPION HCL ER (XL) 150 MG PO TB24: 150.0000 mg | ORAL_TABLET | Freq: Every day | ORAL | Status: DC

## 2014-06-17 MED ORDER — HYDROCORTISONE 2.5 % EX OINT: TOPICAL_OINTMENT | Freq: Two times a day (BID) | CUTANEOUS | Status: DC

## 2014-06-17 MED ORDER — SERTRALINE HCL 100 MG PO TABS: 100.0000 mg | ORAL_TABLET | Freq: Every day | ORAL | Status: DC

## 2014-06-17 MED ORDER — FLUTICASONE PROPIONATE 50 MCG/ACT NA SUSP: 2.0000 | Freq: Every day | NASAL | Status: DC

## 2014-06-17 NOTE — Progress Notes (Signed)
Subjective:    Patient ID: Taylor Powers, female    DOB: 1970/04/19, 44 y.o.   MRN: [id number]  06/17/2014  Medication Refill; Hyperlipidemia; Depression; and Hypothyroidism   HPI This 44 y.o. female presents for nine months follow-up:  1.  Anxiety and derpession:  Patient reports good compliance with medication, good tolerance to medication, and good symptom control.  Taking Zoloft $RemoveBefor'100mg'[id number]$  daily; Wellbutrin XL $RemoveBefor'150mg'wofMNVztAUIt$  daily.  Children have all moved out of house; now just pt and wife; getting much more organized. Graduated from school; now has BSN.    2.  Hypothyroidism secondary to thyroidectomy for thyroid cancer: overdue repeat thyroid testing.  Patient reports good compliance with medication, good tolerance to medication, and good symptom control.  Forgetting supplement often.  Forgets once per week.  Making up for missed doses.  3.  Hypercholesterolemia:  Did not restart Lipitor after last visit.  Would like to work on weight loss, exercise for three months.  Fasting today.  4.  Elevated LFTs:  Improved at last visit.  No abdominal US.  Forgot all about it.   5.  Post-menopausal bleeding:  S/p colposcopy negative by gynecology; no follow-up warranted.    6.  Hematuria: did not undergo urology evaluation.  Would like to repeat urine at next visit.  7. Hand rash: OTC cream not helping; mother has eczema and patient used mother's betamethasone ointment with improvement.  8. Allergies: worsening; usually uses Zyrtec daily.  9. Lower back pain/lumbar strain: noticing more lower back pain; knows that needs to lose weight.  10. Stopped taking Vitamin D supplement one month ago.   Review of Systems  Constitutional: Negative for fever, chills, diaphoresis and fatigue.  HENT: Positive for congestion, postnasal drip and rhinorrhea. Negative for ear pain and sore throat.   Eyes: Negative for visual disturbance.  Respiratory: Negative for cough and shortness of breath.   Cardiovascular:  Negative for chest pain, palpitations and leg swelling.  Gastrointestinal: Negative for nausea, vomiting, abdominal pain, diarrhea and constipation.  Endocrine: Negative for cold intolerance, heat intolerance, polydipsia, polyphagia and polyuria.  Musculoskeletal: Positive for back pain.  Skin: Positive for rash.  Neurological: Negative for dizziness, tremors, seizures, syncope, facial asymmetry, speech difficulty, weakness, light-headedness, numbness and headaches.  Psychiatric/Behavioral: Negative for suicidal ideas, sleep disturbance, self-injury and dysphoric mood. The patient is not nervous/anxious.     Past Medical History  Diagnosis Date  . Diabetes mellitus   . Thyroid disease   . Insomnia, unspecified   . Hematuria, unspecified   . Absence of menstruation   . Other acne   . Hypersomnia, unspecified     night shift work  . Anxiety state, unspecified   . Tobacco use disorder   . Dysthymic disorder   . Dysplasia of cervix, unspecified   . Allergic rhinitis, cause unspecified   . Mixed hyperlipidemia   . Unspecified vitamin D deficiency   . Other abnormal glucose   . Chicken pox   . Arthritis     DDD L4-5; ruptured disc; laminectomy.  . Cancer 09/18/2010    thyroid cancer; s/p thyroidectomy total; s/p ablation.  [facility].   Past Surgical History  Procedure Laterality Date  . Laminectomy  1996    L4-L5  . Tubal ligation  1998  . Total thyroidectomy  2012  . Lipoma of shoulder  2005    Right . removed  . History of leep  2005  . Spine surgery  03/20/1994    L4-L5 laminectomy   Allergies  Allergen Reactions  . Sulfa Antibiotics Hives    All over body   Current Outpatient Prescriptions  Medication Sig Dispense Refill  . buPROPion (WELLBUTRIN XL) 150 MG 24 hr tablet Take 1 tablet (150 mg total) by mouth daily. NO MORE REFILLS WITHOUT OFFICE VISIT - 2ND NOTICE 90 tablet 1  . fluticasone (FLONASE) 50 MCG/ACT nasal spray Place 2 sprays into both nostrils daily. 48 g 4    . levothyroxine (SYNTHROID) 150 MCG tablet Take 1 tablet (150 mcg total) by mouth daily before breakfast. 90 tablet 3  . metaxalone (SKELAXIN) 800 MG tablet Take 1 tablet (800 mg total) by mouth 3 (three) times daily. 60 tablet 1  . sertraline (ZOLOFT) 100 MG tablet Take 1 tablet (100 mg total) by mouth daily. 90 tablet 1  . Armodafinil (NUVIGIL) 150 MG tablet Take 150 mg by mouth daily. Take one hour prior to shift.    Marland Kitchen atorvastatin (LIPITOR) 10 MG tablet TAKE ONE TABLET BY MOUTH DAILY (Patient not taking: Reported on 06/17/2014) 30 tablet 0  . hydrocortisone 2.5 % ointment Apply topically 2 (two) times daily. 30 g 3   No current facility-administered medications for this visit.       Objective:    BP 126/78 mmHg  Pulse 89  Temp(Src) 98.3 F (36.8 C) (Oral)  Resp 16  Ht $R'5\' 7"'RN$  (1.702 m)  Wt 237 lb 6.4 oz (107.684 kg)  BMI 37.17 kg/m2  SpO2 98%  LMP 03/05/2013 Physical Exam  Constitutional: She is oriented to person, place, and time. She appears well-developed and well-nourished. No distress.  HENT:  Head: Normocephalic and atraumatic.  Right Ear: External ear normal.  Left Ear: External ear normal.  Nose: Nose normal.  Mouth/Throat: Oropharynx is clear and moist.  Eyes: Conjunctivae and EOM are normal. Pupils are equal, round, and reactive to light.  Neck: Normal range of motion. Neck supple. Carotid bruit is not present. No thyromegaly present.  Cardiovascular: Normal rate, regular rhythm, normal heart sounds and intact distal pulses.  Exam reveals no gallop and no friction rub.   No murmur heard. Pulmonary/Chest: Effort normal and breath sounds normal. She has no wheezes. She has no rales.  Abdominal: Soft. Bowel sounds are normal. She exhibits no distension and no mass. There is no tenderness. There is no rebound and no guarding.  Musculoskeletal:       Lumbar back: She exhibits pain. She exhibits normal range of motion, no tenderness, no bony tenderness and no spasm.   Lymphadenopathy:    She has no cervical adenopathy.  Neurological: She is alert and oriented to person, place, and time. No cranial nerve deficit.  Skin: Skin is warm and dry. Rash noted. She is not diaphoretic. No erythema. No pallor.  +scaling rash lateral aspects of digits/fingers B hands.  Psychiatric: She has a normal mood and affect. Her behavior is normal.   Results for orders placed or performed in visit on 09/17/13  CBC with Differential  Result Value Ref Range   WBC 5.5 4.0 - 10.5 K/uL   RBC 4.39 3.87 - 5.11 MIL/uL   Hemoglobin 12.7 12.0 - 15.0 g/dL   HCT 37.0 36.0 - 46.0 %   MCV 84.3 78.0 - 100.0 fL   MCH 28.9 26.0 - 34.0 pg   MCHC 34.3 30.0 - 36.0 g/dL   RDW 13.4 11.5 - 15.5 %   Platelets 195 150 - 400 K/uL   Neutrophils Relative % 59 43 - 77 %   Neutro Abs  3.2 1.7 - 7.7 K/uL   Lymphocytes Relative 30 12 - 46 %   Lymphs Abs 1.7 0.7 - 4.0 K/uL   Monocytes Relative 5 3 - 12 %   Monocytes Absolute 0.3 0.1 - 1.0 K/uL   Eosinophils Relative 5 0 - 5 %   Eosinophils Absolute 0.3 0.0 - 0.7 K/uL   Basophils Relative 1 0 - 1 %   Basophils Absolute 0.1 0.0 - 0.1 K/uL   Smear Review Criteria for review not met   Comprehensive metabolic panel  Result Value Ref Range   Sodium 139 135 - 145 mEq/L   Potassium 4.3 3.5 - 5.3 mEq/L   Chloride 105 96 - 112 mEq/L   CO2 25 19 - 32 mEq/L   Glucose, Bld 91 70 - 99 mg/dL   BUN 12 6 - 23 mg/dL   Creat 0.80 0.50 - 1.10 mg/dL   Total Bilirubin 0.4 0.2 - 1.2 mg/dL   Alkaline Phosphatase 86 39 - 117 U/L   AST 22 0 - 37 U/L   ALT 36 (H) 0 - 35 U/L   Total Protein 6.7 6.0 - 8.3 g/dL   Albumin 4.1 3.5 - 5.2 g/dL   Calcium 9.1 8.4 - 10.5 mg/dL  TSH  Result Value Ref Range   TSH 5.623 (H) 0.350 - 4.500 uIU/mL  T4, free  Result Value Ref Range   Free T4 0.89 0.80 - 1.80 ng/dL  POCT urinalysis dipstick  Result Value Ref Range   Color, UA yellow    Clarity, UA clear    Glucose, UA neg    Bilirubin, UA neg    Ketones, UA neg    Spec  Grav, UA 1.020    Blood, UA mod    pH, UA 6.5    Protein, UA neg    Urobilinogen, UA 0.2    Nitrite, UA neg    Leukocytes, UA Negative   POCT UA - Microscopic Only  Result Value Ref Range   WBC, Ur, HPF, POC neg    RBC, urine, microscopic 6-7    Bacteria, U Microscopic trace    Mucus, UA neg    Epithelial cells, urine per micros 0-1    Crystals, Ur, HPF, POC neg    Casts, Ur, LPF, POC neg    Yeast, UA neg        Assessment & Plan:   1. Pure hypercholesterolemia   2. Postoperative hypothyroidism   3. Glucose intolerance (impaired glucose tolerance)   4. Elevated liver function tests   5. Lumbar strain, initial encounter   6. Vitamin D deficiency   7. Allergic rhinitis due to pollen   8. Hematuria   9. Depression with anxiety   10. Dyshidrotic eczema     1.  Hypercholesterolemia: uncontrolled; no Lipitor in nine months; repeat FLP.  Pt desires three month trial of weight loss, exercise, dietary modification. 2.  Post-operative hypothyroidism for thyroid cancer: uncontrolled; obtain labs; non-compliance with medication continues. 3.  Glucose intolerance: persistent; obtain labs; recommend weight loss, exercise, low-sugar food choices. 4.  Elevated LFTs: improved at last visit; repeat today.  If persistently elevated, obtain abdominal US. 5.  Lumbar strain: persistent/worsening; recommend exercise, weight loss, stretches. 6.  Vitamin D deficiency: stable; stopped supplement one month ago; obtain labs. 7.  Allergic Rhinitis: uncontrolled; refill of Flonase provided; start Zyrtec daily. 8. Hematuria: non-compliant with urology consultation; desires to repeat u/a in three months at follow-up. 9. Anxiety and depression: controlled; refill of Zoloft and  Wellbutrin provided. 10. Dishydrotic eczema: persistent; rx for hydrocortisone ointment 2.5% bid provided.   Meds ordered this encounter  Medications  . buPROPion (WELLBUTRIN XL) 150 MG 24 hr tablet    Sig: Take 1 tablet (150  mg total) by mouth daily. NO MORE REFILLS WITHOUT OFFICE VISIT - 2ND NOTICE    Dispense:  90 tablet    Refill:  1  . fluticasone (FLONASE) 50 MCG/ACT nasal spray    Sig: Place 2 sprays into both nostrils daily.    Dispense:  48 g    Refill:  4  . levothyroxine (SYNTHROID) 150 MCG tablet    Sig: Take 1 tablet (150 mcg total) by mouth daily before breakfast.    Dispense:  90 tablet    Refill:  3  . sertraline (ZOLOFT) 100 MG tablet    Sig: Take 1 tablet (100 mg total) by mouth daily.    Dispense:  90 tablet    Refill:  1  . hydrocortisone 2.5 % ointment    Sig: Apply topically 2 (two) times daily.    Dispense:  30 g    Refill:  3    Return in about 3 months (around 09/17/2014) for recheck cholesterol, urine.     Tyria Springer Elayne Guerin, M.D. [facility] [address] [place], [place]  [address] [phone] phone [phone] fax

## 2014-06-17 NOTE — Patient Instructions (Signed)

## 2014-06-18 LAB — CBC WITH DIFFERENTIAL/PLATELET
Basophils Absolute: 0.1 10*3/uL (ref 0.0–0.1)
Basophils Relative: 1 % (ref 0–1)
Eosinophils Absolute: 0.1 10*3/uL (ref 0.0–0.7)
Eosinophils Relative: 2 % (ref 0–5)
HCT: 38.7 % (ref 36.0–46.0)
Hemoglobin: 12.9 g/dL (ref 12.0–15.0)
LYMPHS PCT: 34 % (ref 12–46)
Lymphs Abs: 2.1 10*3/uL (ref 0.7–4.0)
MCH: 29.5 pg (ref 26.0–34.0)
MCHC: 33.3 g/dL (ref 30.0–36.0)
MCV: 88.4 fL (ref 78.0–100.0)
MONO ABS: 0.4 10*3/uL (ref 0.1–1.0)
MPV: 10.3 fL (ref 8.6–12.4)
Monocytes Relative: 6 % (ref 3–12)
Neutro Abs: 3.6 10*3/uL (ref 1.7–7.7)
Neutrophils Relative %: 57 % (ref 43–77)
Platelets: 215 10*3/uL (ref 150–400)
RBC: 4.38 MIL/uL (ref 3.87–5.11)
RDW: 13.3 % (ref 11.5–15.5)
WBC: 6.3 10*3/uL (ref 4.0–10.5)

## 2014-06-18 LAB — COMPREHENSIVE METABOLIC PANEL
ALT: 39 U/L — AB (ref 0–35)
AST: 24 U/L (ref 0–37)
Albumin: 4.6 g/dL (ref 3.5–5.2)
Alkaline Phosphatase: 93 U/L (ref 39–117)
BILIRUBIN TOTAL: 0.4 mg/dL (ref 0.2–1.2)
BUN: 12 mg/dL (ref 6–23)
CHLORIDE: 103 meq/L (ref 96–112)
CO2: 27 mEq/L (ref 19–32)
Calcium: 9.7 mg/dL (ref 8.4–10.5)
Creat: 0.78 mg/dL (ref 0.50–1.10)
Glucose, Bld: 87 mg/dL (ref 70–99)
Potassium: 4.2 mEq/L (ref 3.5–5.3)
SODIUM: 136 meq/L (ref 135–145)
Total Protein: 7.6 g/dL (ref 6.0–8.3)

## 2014-06-18 LAB — LIPID PANEL
Cholesterol: 198 mg/dL (ref 0–200)
HDL: 28 mg/dL — ABNORMAL LOW (ref >=46)
LDL Cholesterol: 120 mg/dL — ABNORMAL HIGH (ref 0–99)
TRIGLYCERIDES: 248 mg/dL — AB (ref <150)
Total CHOL/HDL Ratio: 7.1 Ratio
VLDL: 50 mg/dL — AB (ref 0–40)

## 2014-06-18 LAB — VITAMIN D 25 HYDROXY (VIT D DEFICIENCY, FRACTURES): Vit D, 25-Hydroxy: 29 ng/mL — ABNORMAL LOW (ref 30–100)

## 2014-06-18 LAB — HEMOGLOBIN A1C
Hgb A1c MFr Bld: 6.1 % — ABNORMAL HIGH (ref <5.7)
Mean Plasma Glucose: 128 mg/dL — ABNORMAL HIGH (ref <117)

## 2014-06-19 LAB — TSH: TSH: 5.651 u[IU]/mL — AB (ref 0.350–4.500)

## 2014-06-19 LAB — T4, FREE: Free T4: 1.39 ng/dL (ref 0.80–1.80)

## 2014-06-23 MED ORDER — LEVOTHYROXINE SODIUM 175 MCG PO TABS: 175.0000 ug | ORAL_TABLET | Freq: Every day | ORAL | Status: DC

## 2014-06-23 NOTE — Addendum Note (Signed)
Addended by: Wardell Honour on: 06/23/2014 01:05 PM   Modules accepted: Orders

## 2014-09-30 ENCOUNTER — Ambulatory Visit: Payer: BLUE CROSS/BLUE SHIELD | Admitting: Family Medicine

## 2014-10-14 ENCOUNTER — Ambulatory Visit (INDEPENDENT_AMBULATORY_CARE_PROVIDER_SITE_OTHER): Payer: BLUE CROSS/BLUE SHIELD | Admitting: Family Medicine

## 2014-10-14 ENCOUNTER — Encounter: Payer: Self-pay | Admitting: Family Medicine

## 2014-10-14 VITALS — BP 126/80 | HR 108 | Temp 98.1°F | Resp 16 | Ht 67.0 in | Wt 223.4 lb

## 2014-10-14 DIAGNOSIS — E669 Obesity, unspecified: Secondary | ICD-10-CM

## 2014-10-14 DIAGNOSIS — R319 Hematuria, unspecified: Secondary | ICD-10-CM | POA: Diagnosis not present

## 2014-10-14 DIAGNOSIS — F418 Other specified anxiety disorders: Secondary | ICD-10-CM | POA: Diagnosis not present

## 2014-10-14 DIAGNOSIS — E7800 Pure hypercholesterolemia, unspecified: Secondary | ICD-10-CM

## 2014-10-14 DIAGNOSIS — R7302 Impaired glucose tolerance (oral): Secondary | ICD-10-CM | POA: Insufficient documentation

## 2014-10-14 DIAGNOSIS — R7989 Other specified abnormal findings of blood chemistry: Secondary | ICD-10-CM | POA: Diagnosis not present

## 2014-10-14 DIAGNOSIS — R41840 Attention and concentration deficit: Secondary | ICD-10-CM

## 2014-10-14 DIAGNOSIS — C73 Malignant neoplasm of thyroid gland: Secondary | ICD-10-CM

## 2014-10-14 DIAGNOSIS — E559 Vitamin D deficiency, unspecified: Secondary | ICD-10-CM | POA: Diagnosis not present

## 2014-10-14 DIAGNOSIS — R945 Abnormal results of liver function studies: Secondary | ICD-10-CM

## 2014-10-14 LAB — CBC WITH DIFFERENTIAL/PLATELET
Basophils Absolute: 0 10*3/uL (ref 0.0–0.1)
Basophils Relative: 0 % (ref 0–1)
EOS ABS: 0.1 10*3/uL (ref 0.0–0.7)
Eosinophils Relative: 2 % (ref 0–5)
HEMATOCRIT: 38.7 % (ref 36.0–46.0)
Hemoglobin: 13.2 g/dL (ref 12.0–15.0)
LYMPHS ABS: 1.9 10*3/uL (ref 0.7–4.0)
Lymphocytes Relative: 28 % (ref 12–46)
MCH: 28.5 pg (ref 26.0–34.0)
MCHC: 34.1 g/dL (ref 30.0–36.0)
MCV: 83.6 fL (ref 78.0–100.0)
MPV: 9.3 fL (ref 8.6–12.4)
Monocytes Absolute: 0.4 10*3/uL (ref 0.1–1.0)
Monocytes Relative: 6 % (ref 3–12)
NEUTROS ABS: 4.3 10*3/uL (ref 1.7–7.7)
Neutrophils Relative %: 64 % (ref 43–77)
PLATELETS: 185 10*3/uL (ref 150–400)
RBC: 4.63 MIL/uL (ref 3.87–5.11)
RDW: 13 % (ref 11.5–15.5)
WBC: 6.7 10*3/uL (ref 4.0–10.5)

## 2014-10-14 LAB — COMPREHENSIVE METABOLIC PANEL
ALT: 33 U/L — AB (ref 6–29)
AST: 21 U/L (ref 10–30)
Albumin: 4.2 g/dL (ref 3.6–5.1)
Alkaline Phosphatase: 100 U/L (ref 33–115)
BUN: 13 mg/dL (ref 7–25)
CALCIUM: 9.2 mg/dL (ref 8.6–10.2)
CO2: 27 mEq/L (ref 20–31)
Chloride: 106 mEq/L (ref 98–110)
Creat: 0.77 mg/dL (ref 0.50–1.10)
Glucose, Bld: 97 mg/dL (ref 65–99)
POTASSIUM: 4.1 meq/L (ref 3.5–5.3)
SODIUM: 140 meq/L (ref 135–146)
Total Bilirubin: 0.4 mg/dL (ref 0.2–1.2)
Total Protein: 6.8 g/dL (ref 6.1–8.1)

## 2014-10-14 LAB — LIPID PANEL
CHOL/HDL RATIO: 5.8 ratio — AB (ref <=5.0)
Cholesterol: 185 mg/dL (ref 125–200)
HDL: 32 mg/dL — AB (ref >=46)
LDL Cholesterol: 123 mg/dL (ref <130)
Triglycerides: 152 mg/dL — ABNORMAL HIGH (ref <150)
VLDL: 30 mg/dL (ref <30)

## 2014-10-14 LAB — HEMOGLOBIN A1C
Hgb A1c MFr Bld: 5.7 % — ABNORMAL HIGH (ref <5.7)
MEAN PLASMA GLUCOSE: 117 mg/dL — AB (ref <117)

## 2014-10-14 LAB — T4, FREE: FREE T4: 0.9 ng/dL (ref 0.80–1.80)

## 2014-10-14 LAB — TSH: TSH: 0.313 u[IU]/mL — ABNORMAL LOW (ref 0.350–4.500)

## 2014-10-14 LAB — VITAMIN D 25 HYDROXY (VIT D DEFICIENCY, FRACTURES): Vit D, 25-Hydroxy: 30 ng/mL (ref 30–100)

## 2014-10-14 NOTE — Progress Notes (Signed)
Subjective:    Patient ID: Taylor Powers, female    DOB: 09-03-1970, 44 y.o.   MRN: [id number]  10/14/2014  Follow-up; Hypothyroidism; glucose intolerance; elevated liver function; and ADD   HPI This 44 y.o. female presents for three month follow-up:  1.  Hypercholesterolemia:  Not taking medication at this time; has lost 14 pounds in three months.  Was walking until past month; has gained 6 pounds in past month.    2. Glucose intolerance: increased activity. Weight is down.   3.  Elevated LFTs: due for repeat LFTs; has not undergone ultrasound.  Has lost weight.  4.  Hematuria:  Requested to repeat today; declined and non-compliant with urology; smoking history.  Stopped drinking diet pepsi.  5.  Anxiety and depression:  Dong well.  Changed work schedule.  Concerned about ADD; chronically has problem completing projects.  Chronic issue; now with changing work schedule, now not completing anything.  Children/son with ADD.  Doing well at work; work performance is excellent.  Not ruining my life.  No previous psychological evaluation.  Has been pulling out eyelashes since starting school a few years ago. Occurs when really tired.  Using Nuvigil once per week.   6. Hypothyroidism: increased dose of Synthroid at last visit.  Due for repeat labs.  7. Vitamin D deficiency: non-compliant with supplement.   Review of Systems  Constitutional: Negative for fever, chills, diaphoresis and fatigue.  Eyes: Negative for visual disturbance.  Respiratory: Negative for cough and shortness of breath.   Cardiovascular: Negative for chest pain, palpitations and leg swelling.  Gastrointestinal: Negative for nausea, vomiting, abdominal pain, diarrhea and constipation.  Endocrine: Negative for cold intolerance, heat intolerance, polydipsia, polyphagia and polyuria.  Neurological: Negative for dizziness, tremors, seizures, syncope, facial asymmetry, speech difficulty, weakness, light-headedness, numbness and  headaches.    Past Medical History  Diagnosis Date  . Diabetes mellitus   . Thyroid disease   . Insomnia, unspecified   . Hematuria, unspecified   . Absence of menstruation   . Other acne   . Hypersomnia, unspecified     night shift work  . Anxiety state, unspecified   . Tobacco use disorder   . Dysthymic disorder   . Dysplasia of cervix, unspecified   . Allergic rhinitis, cause unspecified   . Mixed hyperlipidemia   . Unspecified vitamin D deficiency   . Other abnormal glucose   . Chicken pox   . Arthritis     DDD L4-5; ruptured disc; laminectomy.  . Cancer 09/18/2010    thyroid cancer; s/p thyroidectomy total; s/p ablation.  [facility].   Past Surgical History  Procedure Laterality Date  . Laminectomy  1996    L4-L5  . Tubal ligation  1998  . Total thyroidectomy  2012  . Lipoma of shoulder  2005    Right . removed  . History of leep  2005  . Spine surgery  03/20/1994    L4-L5 laminectomy   Allergies  Allergen Reactions  . Sulfa Antibiotics Hives    All over body   Current Outpatient Prescriptions  Medication Sig Dispense Refill  . Armodafinil (NUVIGIL) 150 MG tablet Take 150 mg by mouth daily. Take once a week    . buPROPion (WELLBUTRIN XL) 150 MG 24 hr tablet Take 1 tablet (150 mg total) by mouth daily. NO MORE REFILLS WITHOUT OFFICE VISIT - 2ND NOTICE 90 tablet 1  . fluticasone (FLONASE) 50 MCG/ACT nasal spray Place 2 sprays into both nostrils daily. 48 g 4  .  levothyroxine (SYNTHROID, LEVOTHROID) 175 MCG tablet Take 1 tablet (175 mcg total) by mouth daily before breakfast. 90 tablet 1  . metaxalone (SKELAXIN) 800 MG tablet Take 1 tablet (800 mg total) by mouth 3 (three) times daily. (Patient taking differently: Take 800 mg by mouth 3 (three) times daily. Takes as needed) 60 tablet 1  . sertraline (ZOLOFT) 100 MG tablet Take 1 tablet (100 mg total) by mouth daily. 90 tablet 1   No current facility-administered medications for this visit.       Objective:    BP  126/80 mmHg  Pulse 108  Temp(Src) 98.1 F (36.7 C) (Oral)  Resp 16  Ht 5\' 7"  (1.702 m)  Wt 223 lb 6.4 oz (101.334 kg)  BMI 34.98 kg/m2  SpO2 98%  LMP 03/05/2013 Physical Exam  Constitutional: She is oriented to person, place, and time. She appears well-developed and well-nourished. No distress.  HENT:  Head: Normocephalic and atraumatic.  Right Ear: External ear normal.  Left Ear: External ear normal.  Nose: Nose normal.  Mouth/Throat: Oropharynx is clear and moist.  Eyes: Conjunctivae and EOM are normal. Pupils are equal, round, and reactive to light.  Neck: Normal range of motion. Neck supple. Carotid bruit is not present. No thyromegaly present.  Cardiovascular: Normal rate, regular rhythm, normal heart sounds and intact distal pulses.  Exam reveals no gallop and no friction rub.   No murmur heard. Pulmonary/Chest: Effort normal and breath sounds normal. She has no wheezes. She has no rales.  Abdominal: Soft. Bowel sounds are normal. She exhibits no distension and no mass. There is no tenderness. There is no rebound and no guarding.  Lymphadenopathy:    She has no cervical adenopathy.  Neurological: She is alert and oriented to person, place, and time. No cranial nerve deficit.  Skin: Skin is warm and dry. No rash noted. She is not diaphoretic. No erythema. No pallor.  Psychiatric: She has a normal mood and affect. Her behavior is normal.        Assessment & Plan:   1. Pure hypercholesterolemia   2. Depression with anxiety   3. Hematuria   4. Glucose intolerance (impaired glucose tolerance)   5. Cancer of thyroid T3N0 S/P thyroidectomy 10/07/10   6. Vitamin D deficiency   7. Elevated LFTs   8. Poor concentration   9. Obesity     1. Hypercholesterolemia: uncontrolled; obtain labs; restart medication if no improvement. 2. Depression with anxiety: improved; refer to psychiatry due to concentration difficulties. 3.  Hematuria: persistent; repeat u/a today; pt has been  non-compliant with urology referral in past. 4.  Glucose intolerance: improved likely with weight loss; obtain labs. 5.  Thyroid cancer: s/p thyroidectomy; obtain labs. 6.  Vitamin D deficiency: uncontrolled; obtain labs. 7.  Elevated LFTs: persistent; repeat today; if persistent, refer for abdominal US. 8. Poor concentration: refer for psychology. 9.  Obesity: encourage ongoing weight loss and exercise.   No orders of the defined types were placed in this encounter.    Return in about 3 months (around 01/14/2015) for complete physical examiniation.   Aubriauna Riner Elayne Guerin, M.D. [facility] [address] [place], [place]  [address] [phone] phone [phone] fax

## 2014-10-14 NOTE — Patient Instructions (Signed)

## 2014-12-08 ENCOUNTER — Encounter: Payer: Self-pay | Admitting: Family Medicine

## 2014-12-08 DIAGNOSIS — C73 Malignant neoplasm of thyroid gland: Secondary | ICD-10-CM

## 2014-12-16 ENCOUNTER — Other Ambulatory Visit: Payer: Self-pay | Admitting: Family Medicine

## 2015-01-26 ENCOUNTER — Ambulatory Visit: Payer: Self-pay | Admitting: Internal Medicine

## 2015-01-26 ENCOUNTER — Other Ambulatory Visit: Payer: Self-pay | Admitting: Family Medicine

## 2015-01-27 ENCOUNTER — Encounter: Payer: Self-pay | Admitting: Family Medicine

## 2015-01-29 ENCOUNTER — Encounter: Payer: Self-pay | Admitting: Family Medicine

## 2015-02-11 ENCOUNTER — Other Ambulatory Visit: Payer: Self-pay | Admitting: Physician Assistant

## 2015-02-12 ENCOUNTER — Other Ambulatory Visit: Payer: Self-pay

## 2015-02-12 MED ORDER — LEVOTHYROXINE SODIUM 175 MCG PO TABS: ORAL_TABLET | ORAL | Status: DC

## 2015-02-17 ENCOUNTER — Other Ambulatory Visit: Payer: Self-pay | Admitting: Family Medicine

## 2015-03-30 ENCOUNTER — Ambulatory Visit (INDEPENDENT_AMBULATORY_CARE_PROVIDER_SITE_OTHER): Payer: BLUE CROSS/BLUE SHIELD | Admitting: Internal Medicine

## 2015-03-30 VITALS — BP 108/64 | HR 108 | Temp 98.7°F | Resp 12 | Ht 67.5 in | Wt 235.6 lb

## 2015-03-30 DIAGNOSIS — E890 Postprocedural hypothyroidism: Secondary | ICD-10-CM | POA: Insufficient documentation

## 2015-03-30 DIAGNOSIS — C73 Malignant neoplasm of thyroid gland: Secondary | ICD-10-CM

## 2015-03-30 LAB — TSH: TSH: 2.2 u[IU]/mL (ref 0.35–4.50)

## 2015-03-30 LAB — T4, FREE: Free T4: 0.78 ng/dL (ref 0.60–1.60)

## 2015-03-30 NOTE — Progress Notes (Addendum)
Patient ID: Taylor Powers, female   DOB: 1970-12-29, 45 y.o.   MRN: [id number]   HPI  Taylor Powers is a 45 y.o.-year-old female, referred by her PCP, Dr. Tamala Julian, for management of thyroid cancer and postsurgical hypothyroidism.  Pt. has been dx with thyroid cancer in 2012   Reviewed hx: 07/20/2010: Thyroid U/S: Dominant 4.3 cm solid nodule in the inferior right thyroid gland. 08/19/2010: FNA: FINDINGS CONSISTENT WITH A FOLLICULAR NEOPLASM AND/OR LESION. 10/07/2010: thyroidectomy by Dr Excell Seltzer.  Thyroid, thyroidectomy, with right nodule - FOLLICULAR VARIANT OF PAPILLARY THYROID CARCINOMA, 4.2 CM, CONFINED WITHIN THYROID PARENCHYMA. - NO EVIDENCE OF ANGIOLYMPHATIC INVASION IDENTIFIED. - RESECTION MARGINS ARE CLEAR. - PLEASE SEE ONCOLOGY TEMPLATE FOR DETAILS. Microscopic Comment THYROID Specimen: Thyroid gland Procedure: Total thyroidectomy. Specimen Integrity (intact/fragmented): Intact Tumor focality: Unifocal Dominant tumor: Right lobe Maximum tumor size (cm): 4.2 cm, gross measurement. Histologic type: Follicular variant of papillary thyroid carcinoma Tumor capsule: Partially encapsulated Tumor capsular invasion: Not identified Extrathyroidal extension: No Margins: Negative Lymph - Vascular invasion: Not identified. Lymph nodes: # examined n/a; # positive; n/a TNM code: pT3, pN0 Non-neoplastic thyroid: Unremarkable. 12/14/2010: RAI tx with 108.5 mCi 12/26/2010: Post Tx WBS: Expected activity within the lower neck status post recent thyroidectomy. No evidence of  metastatic disease.  Pt denies feeling nodules in neck, hoarseness, dysphagia/odynophagia, SOB with lying down.  For her postsurgical hypothyroidism, she is on Levothyroxine 150 mcg, taken: - at night as she forgets it in am, at least 2h after dinner - with water - no calcium, iron, PPIs - + powdered multivitamins in am  On B complex - last dose 5 days ago.  I reviewed pt's thyroid tests: Lab Results  Component  Value Date   TSH 0.313* 10/14/2014   TSH 5.651* 06/17/2014   TSH 5.623* 09/17/2013   TSH 1.718 05/20/2013   TSH 33.994* 06/18/2012   TSH 7.184* 11/03/2010   FREET4 0.90 10/14/2014   FREET4 1.39 06/17/2014   FREET4 0.89 09/17/2013   FREET4 1.07 05/20/2013   FREET4 0.94 06/18/2012   FREET4 0.89 11/03/2010    Pt describes: - + improved fatigue - + sleeping well - + weight gain (lost 25 lbs last year, now gained some back) - no cold or heat intolerance - no diarrhea, noconstipation - no dry skin - no hair falling - no anxiety or depression now (on Zoloft)  She has + FH of thyroid disorders: paternal aunt - hypothyroidism. No FH of thyroid cancer.  No h/o radiation tx to head or neck.  She also has a history of hyperlipidemia and prediabetes, with a last hemoglobin A1c is 5.7% after she improved her diet and lost weight. She is now back on the wagon with her diet.  ROS: Constitutional: See HPI Eyes: no blurry vision, no xerophthalmia ENT: no sore throat, no nodules palpated in throat, no dysphagia/odynophagia, no hoarseness Cardiovascular: no CP/SOB/palpitations/leg swelling Respiratory: no cough/SOB Gastrointestinal: no N/V/D/C Musculoskeletal: no muscle/joint aches Skin: no rashes Neurological: no tremors/numbness/tingling/dizziness Psychiatric: no depression/anxiety  Past Medical History  Diagnosis Date  . Diabetes mellitus   . Thyroid disease   . Insomnia, unspecified   . Hematuria, unspecified   . Absence of menstruation   . Other acne   . Hypersomnia, unspecified     night shift work  . Anxiety state, unspecified   . Tobacco use disorder   . Dysthymic disorder   . Dysplasia of cervix, unspecified   . Allergic rhinitis, cause unspecified   . Mixed hyperlipidemia   .  Unspecified vitamin D deficiency   . Other abnormal glucose   . Chicken pox   . Arthritis     DDD L4-5; ruptured disc; laminectomy.  . Cancer 09/18/2010    thyroid cancer; s/p thyroidectomy  total; s/p ablation.  [place].   Past Surgical History  Procedure Laterality Date  . Laminectomy  1996    L4-L5  . Tubal ligation  1998  . Total thyroidectomy  2012  . Lipoma of shoulder  2005    Right . removed  . History of leep  2005  . Spine surgery  03/20/1994    L4-L5 laminectomy   Social History   Social History  . Marital Status: Single    Spouse Name: N/A  . Number of Children: 3  . Years of Education: N/A   Occupational History  . 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 Smoking status: Current Every Day Smoker -- 1.00 packs/day    Types: Cigarettes  . Smokeless tobacco: Never Used  . Alcohol Use: No  . Drug Use: No  . Sexual Activity: Yes     Comment: same sexual partner   Other Topics Concern  . Not on file   Social History Narrative   Marital status:  Married same sex partner 12/2013. Marland Kitchen Happy, no abuse.      Children:  3 children (20, 18, 16); 1 grandchild.        Lives: with wife.      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  Two sixteen hour shifts followed by 8 hour shift; weekends.      Tobacco: 1 ppd x 26 years.        Alcohol: none      Drugs: none      Exercise:  Started exercising this week; walking.      Caffeine use: diet Pepsi and coffee.      Always uses seat belts.       Smoke alarm and carbon monoxide detector in the home.       No guns.    Current Outpatient Prescriptions on File Prior to Visit  Medication Sig Dispense Refill  . Armodafinil (NUVIGIL) 150 MG tablet Take 150 mg by mouth daily. Take once a week    . buPROPion (WELLBUTRIN XL) 150 MG 24 hr tablet TAKE 1 TABLET BY MOUTH EVERY DAY 90 tablet 1  . fluticasone (FLONASE) 50 MCG/ACT nasal spray Place 2 sprays into both nostrils daily. 48 g 4  . levothyroxine (SYNTHROID, LEVOTHROID) 175 MCG tablet TAKE 1 TABLET (175 MCG TOTAL) BY MOUTH DAILY BEFORE BREAKFAST 90 tablet 0  . metaxalone (SKELAXIN) 800 MG tablet  Take 1 tablet (800 mg total) by mouth 3 (three) times daily. (Patient taking differently: Take 800 mg by mouth 3 (three) times daily. Takes as needed) 60 tablet 1  . sertraline (ZOLOFT) 100 MG tablet TAKE 1 TABLET BY MOUTH EVERY DAY  "OFFICE VISIT NEEDED" 90 tablet 0   No current facility-administered medications on file prior to visit.   Allergies  Allergen Reactions  . Sulfa Antibiotics Hives    All over body   Family History  Problem Relation Age of Onset  . Depression Mother   . Kidney disease Mother     R Nephrectomy kidney stones  . Irritable bowel syndrome Mother   . Diabetes Father   . Hypertension Father   . Depression Father   . Cancer  Maternal Grandfather   . Bipolar disorder Son   . ADD / ADHD Son   . Hypertension Paternal Grandmother    PE: BP 108/64 mmHg  Pulse 108  Temp(Src) 98.7 F (37.1 C) (Oral)  Resp 12  Ht 5' 7.5" (1.715 m)  Wt 235 lb 9.6 oz (106.867 kg)  BMI 36.33 kg/m2  SpO2 97%  LMP 03/05/2013 Wt Readings from Last 3 Encounters:  03/30/15 235 lb 9.6 oz (106.867 kg)  10/14/14 223 lb 6.4 oz (101.334 kg)  06/17/14 237 lb 6.4 oz (107.684 kg)   Constitutional: overweight, in NAD Eyes: PERRLA, EOMI, no exophthalmos ENT: moist mucous membranes, no  Neck masses, cervical scar healed well, no dysesthesia at the scar site ; no cervical lymphadenopathy Cardiovascular:  Tachycardia, RR, No MRG Respiratory: CTA B Gastrointestinal: abdomen soft, NT, ND, BS+ Musculoskeletal: no deformities, strength intact in all 4 Skin: moist, warm, no rashes Neurological: no tremor with outstretched hands, DTR normal in all 4  ASSESSMENT: 1. Thyroid cancer - see HPI  2. Postsurgical Hypothyroidism  PLAN:  1. Thyroid cancer -  Follicular variant of papillary ThyCa - stage I b/c age <45 at dx - I had a long discussion with the patient about her thyroid cancer. We reviewed together the pathology >>  Large, partially encapsulated  FV PTC,  Which was completely resected.  She then had RAI treatment and a whole body scan obtained posttreatment showed absorption in the thyroid foci but no neck or distant uptake - I reassured her that papillary thyroid cancer is a slow growing cancer with good prognosis; her life expectancy is unlikely to be reduced due to the cancer.  -  She did not have thyroglobulin checked in the last 4 years, so we will obtain the level today. I explained this is used as a tumor marker and an upward trend a thyroglobulin levels may mean a recurrence of the cancer. -  Depending on the thyroglobulin, I also plan to obtain an imaging test: either a Thyrogen stimulated whole-body scan  (since she did not have one year after her RAI treatment) or a neck ultrasound. She agrees with the plan.  2. Patient with h/o total thyroidectomy for cancer, now with iatrogenic hypothyroidism, on levothyroxine therapy. She appears euthyroid, but has very fluctuating TFTs 2/2 taking the LT4 after dinner, on a full stomach and also, per her admission, not being completely compliant with LT4 in the past.  -  We discussed about the importance of moving levothyroxine in the morning,  On an empty stomach, for more consistent absorption. I advised her to put the bottle of medication on her nightstand, near her phone. She can then drink her coffee  + creamer 30 minutes later, and her shake with multivitamins at least 2 hours later. She agrees to make these changes. - She does not appear to have neck masses, enlarged cervical lymph nodes, or neck compression symptoms and her cervical scar has healed well - We discussed about correct intake of levothyroxine, fasting, with water, separated by at least 30 minutes from breakfast, and separated by more than 4 hours from calcium, iron, multivitamins, acid reflux medications (PPIs). - will check thyroid tests today: TSH, free T4 - target TSH: normal range (no need to suppress TSH <LLN) -  We'll need to repeat her TFTs  In 5-6 weeks after she  makes the above changes - If these are normal, we will recheck them at next visit  Office Visit on 03/30/2015  Component Date  Value Ref Range Status  . Free T4 03/30/2015 0.78  0.60 - 1.60 ng/dL Final  . TSH 03/30/2015 2.20  0.35 - 4.50 uIU/mL Final  . Thyroglobulin 03/30/2015 0.1* 2.8 - 40.9 ng/mL Final   Comment: Thyroglobulin antibodies (TGAb) interfere with Thyroglobulin (TG) assays; therefore, Thyroglobulin antibody (TGAb) assay should always be performed in conjunction with a Thyroglobulin (TG) assay.   This test was performed using the Beckman Coulter chemiluminescent method.  Values obtained from different assay methods cannot be used interchangeably.  Thyroglobulin levels, regardless of value, should not be interpreted as absolute evidence of the presence or absence of disease.   . Thyroglobulin Ab 03/30/2015 <1  <2 IU/mL Final   Tg barely detectable. I would like to go ahead and obtain a neck U/S. I will addend the results when they become available.  TFTs normal.   Addendum 05/13/2015 CLINICAL DATA: Thyroid carcinoma, post thyroidectomy.  EXAM: THYROID ULTRASOUND  TECHNIQUE: Ultrasound examination of the thyroid gland and adjacent soft tissues was performed.  COMPARISON: Scintigraphy 12/26/2010 and previous studies  FINDINGS: Right thyroid lobe  Surgically absent. No nodules visualized.  Left thyroid lobe  Surgically absent. No nodules visualized.  Isthmus  Surgically absent. No nodules visualized.  Lymphadenopathy  None visualized. Bilateral submandibular nodes measure 8 mm short axis diameter .  IMPRESSION: 1. No residual/recurrent tissue post thyroidectomy.   Electronically Signed  By: Lucrezia Europe M.D.  On: 05/13/2015 11:48

## 2015-03-30 NOTE — Patient Instructions (Signed)
Please stop at the lab.  Please move the Levothyroxine (150 mcg) in am, at waking up.  Move coffee + creamer 30 min later.  Move multivitamins (shake) 2h after Levothyroxine.  Take the thyroid hormone every day, with water, separated by at least 4 hours from: - acid reflux medications - calcium - iron - multivitamins  Please come back for a follow-up appointment in 6 months.

## 2015-03-31 ENCOUNTER — Encounter: Payer: Self-pay | Admitting: Internal Medicine

## 2015-03-31 LAB — THYROGLOBULIN LEVEL: Thyroglobulin: 0.1 ng/mL — ABNORMAL LOW (ref 2.8–40.9)

## 2015-03-31 LAB — THYROGLOBULIN ANTIBODY: Thyroglobulin Ab: <1 IU/mL (ref <2)

## 2015-04-06 ENCOUNTER — Encounter: Payer: Self-pay | Admitting: Family Medicine

## 2015-04-22 ENCOUNTER — Encounter: Payer: Self-pay | Admitting: Internal Medicine

## 2015-05-03 ENCOUNTER — Encounter: Payer: Self-pay | Admitting: Family Medicine

## 2015-05-04 ENCOUNTER — Encounter: Payer: Self-pay | Admitting: Family Medicine

## 2015-05-04 ENCOUNTER — Ambulatory Visit (INDEPENDENT_AMBULATORY_CARE_PROVIDER_SITE_OTHER): Payer: BLUE CROSS/BLUE SHIELD | Admitting: Family Medicine

## 2015-05-04 VITALS — BP 117/74 | HR 99 | Temp 98.2°F | Resp 16 | Ht 67.0 in | Wt 231.0 lb

## 2015-05-04 DIAGNOSIS — G4726 Circadian rhythm sleep disorder, shift work type: Secondary | ICD-10-CM | POA: Diagnosis not present

## 2015-05-04 DIAGNOSIS — F418 Other specified anxiety disorders: Secondary | ICD-10-CM | POA: Diagnosis not present

## 2015-05-04 DIAGNOSIS — J302 Other seasonal allergic rhinitis: Secondary | ICD-10-CM

## 2015-05-04 DIAGNOSIS — E7800 Pure hypercholesterolemia, unspecified: Secondary | ICD-10-CM

## 2015-05-04 DIAGNOSIS — Z716 Tobacco abuse counseling: Secondary | ICD-10-CM

## 2015-05-04 DIAGNOSIS — R87810 Cervical high risk human papillomavirus (HPV) DNA test positive: Secondary | ICD-10-CM

## 2015-05-04 DIAGNOSIS — Z124 Encounter for screening for malignant neoplasm of cervix: Secondary | ICD-10-CM

## 2015-05-04 DIAGNOSIS — E890 Postprocedural hypothyroidism: Secondary | ICD-10-CM

## 2015-05-04 DIAGNOSIS — C73 Malignant neoplasm of thyroid gland: Secondary | ICD-10-CM

## 2015-05-04 DIAGNOSIS — R319 Hematuria, unspecified: Secondary | ICD-10-CM

## 2015-05-04 DIAGNOSIS — Z0000 Encounter for general adult medical examination without abnormal findings: Secondary | ICD-10-CM | POA: Diagnosis not present

## 2015-05-04 DIAGNOSIS — R7302 Impaired glucose tolerance (oral): Secondary | ICD-10-CM

## 2015-05-04 DIAGNOSIS — Z23 Encounter for immunization: Secondary | ICD-10-CM

## 2015-05-04 MED ORDER — BUPROPION HCL ER (XL) 150 MG PO TB24: ORAL_TABLET | ORAL | Status: DC

## 2015-05-04 MED ORDER — METAXALONE 800 MG PO TABS: 800.0000 mg | ORAL_TABLET | Freq: Three times a day (TID) | ORAL | Status: DC

## 2015-05-04 MED ORDER — FLUTICASONE PROPIONATE 50 MCG/ACT NA SUSP: 2.0000 | Freq: Every day | NASAL | Status: DC

## 2015-05-04 MED ORDER — MODAFINIL 100 MG PO TABS: 100.0000 mg | ORAL_TABLET | Freq: Every day | ORAL | Status: DC

## 2015-05-04 MED ORDER — SERTRALINE HCL 100 MG PO TABS: ORAL_TABLET | ORAL | Status: DC

## 2015-05-04 NOTE — [facility] Notes (Signed)
Subjective:    Patient ID: Taylor Powers, female    DOB: 08/21/1970, 45 y.o.   MRN: [id number]  05/04/2015  Annual Exam   HPI This 45 y.o. female presents for Complete Physical Examination.   Last physical:  06-17-2013 Pap smear:  06-18-2012; [place] in 2015 Mammogram:  07-16-2013 Colonoscopy:  never TDAP:  2012 Influenza: 12-2014 Eye exam:  Never; +readers Dental exam:  Nothing recently.     Review of Systems  Constitutional: Negative for fever, chills, diaphoresis, activity change, appetite change, fatigue and unexpected weight change.  HENT: Negative for congestion, dental problem, drooling, ear discharge, ear pain, facial swelling, hearing loss, mouth sores, nosebleeds, postnasal drip, rhinorrhea, sinus pressure, sneezing, sore throat, tinnitus, trouble swallowing and voice change.   Eyes: Negative for photophobia, pain, discharge, redness, itching and visual disturbance.  Respiratory: Negative for apnea, cough, choking, chest tightness, shortness of breath, wheezing and stridor.   Cardiovascular: Negative for chest pain, palpitations and leg swelling.  Gastrointestinal: Negative for nausea, vomiting, abdominal pain, diarrhea, constipation, blood in stool, abdominal distention, anal bleeding and rectal pain.  Endocrine: Negative for cold intolerance, heat intolerance, polydipsia, polyphagia and polyuria.  Genitourinary: Negative for dysuria, urgency, frequency, hematuria, flank pain, decreased urine volume, vaginal bleeding, vaginal discharge, enuresis, difficulty urinating, genital sores, vaginal pain, menstrual problem, pelvic pain and dyspareunia.  Musculoskeletal: Negative for myalgias, back pain, joint swelling, arthralgias, gait problem, neck pain and neck stiffness.  Skin: Negative for color change, pallor, rash and wound.  Allergic/Immunologic: Negative for environmental allergies, food allergies and immunocompromised state.  Neurological: Negative for dizziness, tremors,  seizures, syncope, facial asymmetry, speech difficulty, weakness, light-headedness, numbness and headaches.  Hematological: Negative for adenopathy. Does not bruise/bleed easily.  Psychiatric/Behavioral: Negative for suicidal ideas, hallucinations, behavioral problems, confusion, sleep disturbance, self-injury, dysphoric mood, decreased concentration and agitation. The patient is not nervous/anxious and is not hyperactive.     Past Medical History  Diagnosis Date  . Diabetes mellitus   . Thyroid disease   . Insomnia, unspecified   . Hematuria, unspecified   . Absence of menstruation   . Other acne   . Hypersomnia, unspecified     night shift work  . Anxiety state, unspecified   . Tobacco use disorder   . Dysthymic disorder   . Dysplasia of cervix, unspecified   . Allergic rhinitis, cause unspecified   . Mixed hyperlipidemia   . Unspecified vitamin D deficiency   . Other abnormal glucose   . Chicken pox   . Arthritis     DDD L4-5; ruptured disc; laminectomy.  . Cancer ([place]) 09/18/2010    thyroid cancer; s/p thyroidectomy total; s/p ablation.  [place].   Past Surgical History  Procedure Laterality Date  . Laminectomy  1996    L4-L5  . Tubal ligation  1998  . Total thyroidectomy  2012  . Lipoma of shoulder  2005    Right . removed  . History of leep  2005  . Spine surgery  03/20/1994    L4-L5 laminectomy   Allergies  Allergen Reactions  . Sulfa Antibiotics Hives    All over body   Current Outpatient Prescriptions  Medication Sig Dispense Refill  . Armodafinil (NUVIGIL) 150 MG tablet Take 150 mg by mouth daily. Take once a week    . buPROPion (WELLBUTRIN XL) 150 MG 24 hr tablet TAKE 1 TABLET BY MOUTH EVERY DAY 90 tablet 1  . fluticasone (FLONASE) 50 MCG/ACT nasal spray Place 2 sprays into both nostrils daily. [place]  g 4  . levothyroxine (SYNTHROID, LEVOTHROID) 150 MCG tablet TAKE 1 TABLET BY MOUTH EVERY DAY BEFORE BREAKFAST  3  . metaxalone (SKELAXIN) 800 MG tablet Take 1  tablet (800 mg total) by mouth 3 (three) times daily. 60 tablet 1  . sertraline (ZOLOFT) 100 MG tablet TAKE 1 TABLET BY MOUTH EVERY DAY 90 tablet 3  . modafinil (PROVIGIL) 100 MG tablet Take 1 tablet (100 mg total) by mouth daily. 30 tablet 5   No current facility-administered medications for this visit.   Social History   Social History  . Marital Status: Married    Spouse Name: Dawayne Cirri  . Number of Children: 3  . Years of Education: N/A   Occupational History  . 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 Smoking status: Current Every Day Smoker -- 0.50 packs/day for 27 years    Types: Cigarettes  . Smokeless tobacco: Never Used  . Alcohol Use: No  . Drug Use: No  . Sexual Activity: Yes     Comment: same sexual partner   Other Topics Concern  . Not on file   Social History Narrative   Marital status:  Married same sex partner 12/2013 Dawayne Cirri. Happy, no abuse.      Children:  3 children (20, 18, 16); 2 grandchild.        Lives: with wife.      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  Two sixteen hour shifts followed by 8 hour shift; weekends.  Off Mon-Thurs.  [employer] job.        Tobacco: 1 ppd x 26 years.        Alcohol: none; partner in recovery.      Drugs: none      Exercise: none      Caffeine use: diet Pepsi and coffee.      Always uses seat belts.       Smoke alarm and carbon monoxide detector in the home.       No guns.    Family History  Problem Relation Age of Onset  . Depression Mother   . Kidney disease Mother     R Nephrectomy kidney stones  . Irritable bowel syndrome Mother   . Diabetes Father   . Depression Father   . Cancer Maternal Grandfather   . Bipolar disorder Son   . ADD / ADHD Son   . Hypertension Paternal Grandmother        Objective:    BP 117/74 mmHg  Pulse 99  Temp(Src) 98.2 F (36.8 C)  Resp 16  Ht 5\' 7"  (1.702 m)  Wt 231 lb (104.781 kg)  BMI 36.17  kg/m2  LMP 03/05/2013 Physical Exam  Constitutional: She is oriented to person, place, and time. She appears well-developed and well-nourished. No distress.  HENT:  Head: Normocephalic and atraumatic.  Right Ear: External ear normal.  Left Ear: External ear normal.  Nose: Nose normal.  Mouth/Throat: Oropharynx is clear and moist.  Eyes: Conjunctivae and EOM are normal. Pupils are equal, round, and reactive to light.  Neck: Normal range of motion and full passive range of motion without pain. Neck supple. No JVD present. Carotid bruit is not present. No thyromegaly present.  Cardiovascular: Normal rate, regular rhythm and normal heart sounds.  Exam reveals no gallop and no friction rub.   No murmur heard. Pulmonary/Chest: Effort normal and breath sounds normal. She has no  wheezes. She has no rales.  Abdominal: Soft. Bowel sounds are normal. She exhibits no distension and no mass. There is no tenderness. There is no rebound and no guarding.  Musculoskeletal:       Right shoulder: Normal.       Left shoulder: Normal.       Cervical back: Normal.  Lymphadenopathy:    She has no cervical adenopathy.  Neurological: She is alert and oriented to person, place, and time. She has normal reflexes. No cranial nerve deficit. She exhibits normal muscle tone. Coordination normal.  Skin: Skin is warm and dry. No rash noted. She is not diaphoretic. No erythema. No pallor.  Psychiatric: She has a normal mood and affect. Her behavior is normal. Judgment and thought content normal.  Nursing note and vitals reviewed.  Results for orders placed or performed in visit on 05/04/15  Pap IG and HPV (high risk) DNA detection  Result Value Ref Range   HPV DNA High Risk Detected (A)    Specimen adequacy: SEE NOTE    FINAL DIAGNOSIS: SEE NOTE    COMMENTS: SEE NOTE    Cytotechnologist: SEE NOTE    QC Cytotechnologist: SEE NOTE        Assessment & Plan:   1. Routine physical examination   2. Cervical cancer  screening   3. Need for prophylactic vaccination against Streptococcus pneumoniae (pneumococcus)   4. Tobacco abuse counseling   5. Shift work sleep disorder   6. Pure hypercholesterolemia   7. Depression with anxiety   8. Cervical high risk HPV (human papillomavirus) test positive   9. Other seasonal allergic rhinitis   10. Hematuria   11. Glucose intolerance (impaired glucose tolerance)   12. Cancer of thyroid T3N0 S/P thyroidectomy 10/07/10   13. Postsurgical hypothyroidism      Orders Placed This Encounter  Procedures  . Pneumococcal polysaccharide vaccine 23-valent greater than or equal to 2yo subcutaneous/IM   Meds ordered this encounter  Medications  . sertraline (ZOLOFT) 100 MG tablet    Sig: TAKE 1 TABLET BY MOUTH EVERY DAY    Dispense:  90 tablet    Refill:  3  . fluticasone (FLONASE) 50 MCG/ACT nasal spray    Sig: Place 2 sprays into both nostrils daily.    Dispense:  48 g    Refill:  4  . buPROPion (WELLBUTRIN XL) 150 MG 24 hr tablet    Sig: TAKE 1 TABLET BY MOUTH EVERY DAY    Dispense:  90 tablet    Refill:  1  . metaxalone (SKELAXIN) 800 MG tablet    Sig: Take 1 tablet (800 mg total) by mouth 3 (three) times daily.    Dispense:  60 tablet    Refill:  1  . modafinil (PROVIGIL) 100 MG tablet    Sig: Take 1 tablet (100 mg total) by mouth daily.    Dispense:  30 tablet    Refill:  5    Return in about 6 months (around 11/01/2015) for recheck.    Kristi Elayne Guerin, M.D. [facility] [address] [place], [place]  [address] [phone] phone [phone] fax

## 2015-05-04 NOTE — Patient Instructions (Signed)

## 2015-05-06 LAB — PAP IG AND HPV HIGH-RISK: HPV DNA HIGH RISK: DETECTED — AB

## 2015-05-13 ENCOUNTER — Ambulatory Visit
Admission: RE | Admit: 2015-05-13 | Discharge: 2015-05-13 | Disposition: A | Discharge status: Home / Self Care | Payer: BLUE CROSS/BLUE SHIELD | Source: Ambulatory Visit | Attending: Internal Medicine | Admitting: Internal Medicine

## 2015-06-02 ENCOUNTER — Encounter: Payer: Self-pay | Admitting: Family Medicine

## 2015-06-04 NOTE — Telephone Encounter (Signed)
PT drop off History and Physical form, completed [facility]- Patient Forms needing completion by provider; left in nurses box- KJR

## 2015-06-08 ENCOUNTER — Ambulatory Visit (INDEPENDENT_AMBULATORY_CARE_PROVIDER_SITE_OTHER): Payer: BLUE CROSS/BLUE SHIELD | Admitting: Family Medicine

## 2015-06-08 VITALS — BP 110/80 | HR 85 | Temp 97.7°F | Resp 17 | Ht 67.0 in | Wt 233.0 lb

## 2015-06-08 DIAGNOSIS — Z111 Encounter for screening for respiratory tuberculosis: Secondary | ICD-10-CM

## 2015-06-08 NOTE — Patient Instructions (Addendum)
Sure this paperwork on return in 1-3 weeks when you need your second of the 2 step PPD test done. Tell the desk that this is only to get the test applied, you do not need a second provider encounter.  Skin tests need to be interpreted within 48-72 hour window after being applied.   IF you received an x-ray today, you will receive an invoice from [facility]. Please contact [facility] at [phone] with questions or concerns regarding your invoice.   IF you received labwork today, you will receive an invoice from [facility]. Please contact [facility] at [phone] with questions or concerns regarding your invoice.   Our billing staff will not be able to assist you with questions regarding bills from these companies.  You will be contacted with the lab results as soon as they are available. The fastest way to get your results is to activate your My Chart account. Instructions are located on the last page of this paperwork. If you have not heard from [place] regarding the results in 2 weeks, please contact this office.

## 2015-06-08 NOTE — Progress Notes (Signed)
Patient ID: Taylor Powers, female    DOB: 1971-03-08  Age: 45 y.o. MRN: [id number]  Chief Complaint  Patient presents with  . Immunizations    PPD    Subjective:   Patient needs PPD two-step test for her job. That requires a separation of 1-3 weeks between the 2 tests. She will be out of town next week, so will get the second test done after that.  Current allergies, medications, problem list, past/family and social histories reviewed.  Objective:  BP 110/80 mmHg  Pulse 85  Temp(Src) 97.7 F (36.5 C) (Oral)  Resp 17  Ht 5\' 7"  (1.702 m)  Wt 233 lb (105.688 kg)  BMI 36.48 kg/m2  SpO2 98%  LMP 03/05/2013  Patient is healthy and has not had any risk of TB  Assessment & Plan:   Assessment: 1. Screening-pulmonary TB       Plan: PPD today which needs to be interpreted in 48-72 hours and return in 1-3 weeks for the second part of a two-step test.  Orders Placed This Encounter  Procedures  . TB Skin Test    Order Specific Question:  Has patient ever tested positive?    Answer:  No    No orders of the defined types were placed in this encounter.         Patient Instructions   Sure this paperwork on return in 1-3 weeks when you need your second of the 2 step PPD test done. Tell the desk that this is only to get the test applied, you do not need a second provider encounter.  Skin tests need to be interpreted within 48-72 hour window after being applied.   IF you received an x-ray today, you will receive an invoice from [facility]. Please contact [facility] at [phone] with questions or concerns regarding your invoice.   IF you received labwork today, you will receive an invoice from [facility]. Please contact [facility] at [phone] with questions or concerns regarding your invoice.   Our billing staff will not be able to assist you with questions regarding bills from these companies.  You will be contacted with  the lab results as soon as they are available. The fastest way to get your results is to activate your My Chart account. Instructions are located on the last page of this paperwork. If you have not heard from [place] regarding the results in 2 weeks, please contact this office.          Return in about 2 weeks (around 06/22/2015).   HOPPER,DAVID, MD 06/08/2015

## 2015-06-08 NOTE — Progress Notes (Deleted)
     IF you received an x-ray today, you will receive an invoice from [facility]. Please contact [facility] at [phone] with questions or concerns regarding your invoice.   IF you received labwork today, you will receive an invoice from [facility]. Please contact [facility] at [phone] with questions or concerns regarding your invoice.   Our billing staff will not be able to assist you with questions regarding bills from these companies.  You will be contacted with the lab results as soon as they are available. The fastest way to get your results is to activate your My Chart account. Instructions are located on the last page of this paperwork. If you have not heard from us regarding the results in 2 weeks, please contact this office.      

## 2015-06-11 ENCOUNTER — Ambulatory Visit (INDEPENDENT_AMBULATORY_CARE_PROVIDER_SITE_OTHER): Payer: BLUE CROSS/BLUE SHIELD

## 2015-06-11 DIAGNOSIS — Z111 Encounter for screening for respiratory tuberculosis: Secondary | ICD-10-CM

## 2015-06-11 LAB — TB SKIN TEST
Induration: 0 mm
TB Skin Test: NEGATIVE

## 2015-06-11 NOTE — Progress Notes (Signed)
Pt. Came in today for PPD read was negative with 0 induration

## 2015-06-19 ENCOUNTER — Ambulatory Visit: Payer: BLUE CROSS/BLUE SHIELD | Admitting: Family Medicine

## 2015-06-19 ENCOUNTER — Encounter: Payer: Self-pay | Admitting: Family Medicine

## 2015-06-19 DIAGNOSIS — Z111 Encounter for screening for respiratory tuberculosis: Secondary | ICD-10-CM

## 2015-06-21 ENCOUNTER — Ambulatory Visit: Payer: BLUE CROSS/BLUE SHIELD | Admitting: Family Medicine

## 2015-06-21 DIAGNOSIS — Z111 Encounter for screening for respiratory tuberculosis: Secondary | ICD-10-CM

## 2015-06-21 LAB — TB SKIN TEST
INDURATION: 0 mm
TB Skin Test: NEGATIVE

## 2015-06-21 NOTE — Progress Notes (Signed)
   Subjective:    Patient ID: Taylor Powers, female    DOB: Feb 01, 1971, 45 y.o.   MRN: [id number]  HPI  Patient here for tb read only    Review of Systems     Objective:   Physical Exam        Assessment & Plan:

## 2015-06-28 ENCOUNTER — Other Ambulatory Visit: Payer: Self-pay | Admitting: Family Medicine

## 2015-08-20 ENCOUNTER — Other Ambulatory Visit: Payer: Self-pay | Admitting: Family Medicine

## 2015-09-27 ENCOUNTER — Ambulatory Visit: Payer: BLUE CROSS/BLUE SHIELD | Admitting: Internal Medicine

## 2015-11-02 ENCOUNTER — Ambulatory Visit: Payer: BLUE CROSS/BLUE SHIELD | Admitting: Family Medicine

## 2015-11-24 ENCOUNTER — Ambulatory Visit (INDEPENDENT_AMBULATORY_CARE_PROVIDER_SITE_OTHER): Payer: BLUE CROSS/BLUE SHIELD | Admitting: Family Medicine

## 2015-11-24 VITALS — BP 122/80 | HR 92 | Temp 98.1°F | Resp 18 | Ht 67.0 in | Wt 239.0 lb

## 2015-11-24 DIAGNOSIS — F418 Other specified anxiety disorders: Secondary | ICD-10-CM | POA: Diagnosis not present

## 2015-11-24 DIAGNOSIS — R41840 Attention and concentration deficit: Secondary | ICD-10-CM

## 2015-11-24 DIAGNOSIS — E890 Postprocedural hypothyroidism: Secondary | ICD-10-CM

## 2015-11-24 DIAGNOSIS — R87810 Cervical high risk human papillomavirus (HPV) DNA test positive: Secondary | ICD-10-CM | POA: Diagnosis not present

## 2015-11-24 DIAGNOSIS — Z0184 Encounter for antibody response examination: Secondary | ICD-10-CM

## 2015-11-24 DIAGNOSIS — C73 Malignant neoplasm of thyroid gland: Secondary | ICD-10-CM

## 2015-11-24 DIAGNOSIS — R7302 Impaired glucose tolerance (oral): Secondary | ICD-10-CM | POA: Diagnosis not present

## 2015-11-24 DIAGNOSIS — Z23 Encounter for immunization: Secondary | ICD-10-CM | POA: Diagnosis not present

## 2015-11-24 LAB — POCT URINALYSIS DIP (MANUAL ENTRY)
Bilirubin, UA: NEGATIVE
GLUCOSE UA: NEGATIVE
Ketones, POC UA: NEGATIVE
LEUKOCYTES UA: NEGATIVE
NITRITE UA: NEGATIVE
PH UA: 5.5
Protein Ur, POC: NEGATIVE
Spec Grav, UA: 1.025
UROBILINOGEN UA: 0.2

## 2015-11-24 LAB — POCT CBC
Granulocyte percent: 53.6 %G (ref 37–80)
HCT, POC: 36.1 % — AB (ref 37.7–47.9)
Hemoglobin: 12.8 g/dL (ref 12.2–16.2)
LYMPH, POC: 2.2 (ref 0.6–3.4)
MCH: 30 pg (ref 27–31.2)
MCHC: 35.4 g/dL (ref 31.8–35.4)
MCV: 84.7 fL (ref 80–97)
MID (CBC): 0.5 (ref 0–0.9)
MPV: 7.2 fL (ref 0–99.8)
PLATELET COUNT, POC: 184 10*3/uL (ref 142–424)
POC Granulocyte: 3.1 (ref 2–6.9)
POC LYMPH PERCENT: 37.9 %L (ref 10–50)
POC MID %: 8.5 % (ref 0–12)
RBC: 4.26 M/uL (ref 4.04–5.48)
RDW, POC: 13.2 %
WBC: 5.7 10*3/uL (ref 4.6–10.2)

## 2015-11-24 LAB — POC MICROSCOPIC URINALYSIS (UMFC): Mucus: ABSENT

## 2015-11-24 NOTE — Patient Instructions (Addendum)
1. Call [facility].      IF you received an x-ray today, you will receive an invoice from [facility]. Please contact [facility] at [phone] with questions or concerns regarding your invoice.   IF you received labwork today, you will receive an invoice from [facility]. Please contact [facility] at [phone] with questions or concerns regarding your invoice.   Our billing staff will not be able to assist you with questions regarding bills from these companies.  You will be contacted with the lab results as soon as they are available. The fastest way to get your results is to activate your My Chart account. Instructions are located on the last page of this paperwork. If you have not heard from [place] regarding the results in 2 weeks, please contact this office.    We recommend that you schedule a mammogram for breast cancer screening. Typically, you do not need a referral to do this. Please contact a local imaging center to schedule your mammogram.  [facility] - [phone]  *ask for the Radiology Department [facility] - [phone] or [phone]  [facility] - [phone] [place] [phone] [facility] - [phone]  *ask for the [facility] - [phone]  *ask for the Radiology Department [facility] - [phone]  *ask for the [place] - [phone] (Flu) Vaccine (Inactivated or Recombinant):  1. Why get vaccinated? Influenza ("flu") is a contagious disease that spreads around the [place] every year, usually between October and May. Flu is caused by influenza viruses, and is spread mainly by coughing, sneezing, and close contact. Anyone can get flu. Flu strikes suddenly and can last several days. Symptoms vary by age, but can  include:  fever/chills  sore throat  muscle aches  fatigue  cough  headache  runny or stuffy nose Flu can also lead to pneumonia and blood infections, and cause diarrhea and seizures in children. If you have a medical condition, such as heart or lung disease, flu can make it worse. Flu is more dangerous for some people. Infants and young children, people 2 years of age and older, pregnant women, and people with certain health conditions or a weakened immune system are at greatest risk. Each year thousands of people in the [place] die from flu, and many more are hospitalized. Flu vaccine can:  keep you from getting flu,  make flu less severe if you do get it, and  keep you from spreading flu to your family and other people. 2. Inactivated and recombinant flu vaccines A dose of flu vaccine is recommended every flu season. Children 6 months through 59 years of age may need two doses during the same flu season. Everyone else needs only one dose each flu season. Some inactivated flu vaccines contain a very small amount of a mercury-based preservative called thimerosal. Studies have not shown thimerosal in vaccines to be harmful, but flu vaccines that do not contain thimerosal are available. There is no live flu virus in flu shots. They cannot cause the flu. There are many flu viruses, and they are always changing. Each year a new flu vaccine is made to protect against three or four viruses that are likely to cause disease in the upcoming flu season. But even when the vaccine doesn't exactly match these viruses, it may still provide some protection. Flu vaccine cannot prevent:  flu that is caused by a virus not covered by the vaccine, or  illnesses that look like flu but are not. It takes about 2 weeks for protection to develop after vaccination, and protection lasts through the flu season. 3. Some people should not get this vaccine Tell the person who is giving you the  vaccine:  If you have any severe, life-threatening allergies. If you ever had a life-threatening allergic reaction after a dose of flu vaccine, or have a severe allergy to any part of this vaccine, you may be advised not to get vaccinated. Most, but not all, types of flu vaccine contain a small amount of egg protein.  If you ever had Guillain-Barre Syndrome (also called GBS). Some people with a history of GBS should not get this vaccine. This should be discussed with your doctor.  If you are not feeling well. It is usually okay to get flu vaccine when you have a mild illness, but you might be asked to come back when you feel better. 4. Risks of a vaccine reaction With any medicine, including vaccines, there is a chance of reactions. These are usually mild and go away on their own, but serious reactions are also possible. Most people who get a flu shot do not have any problems with it. Minor problems following a flu shot include:  soreness, redness, or swelling where the shot was given  hoarseness  sore, red or itchy eyes  cough  fever  aches  headache  itching  fatigue If these problems occur, they usually begin soon after the shot and last 1 or 2 days. More serious problems following a flu shot can include the following:  There may be a small increased risk of Guillain-Barre Syndrome (GBS) after inactivated flu vaccine. This risk has been estimated at 1 or 2 additional cases per million people vaccinated. This is much lower than the risk of severe complications from flu, which can be prevented by flu vaccine.  Young children who get the flu shot along with pneumococcal vaccine (PCV13) and/or DTaP vaccine at the same time might be slightly more likely to have a seizure caused by fever. Ask your doctor for more information. Tell your doctor if a child who is getting flu vaccine has ever had a seizure. Problems that could happen after any injected vaccine:  People sometimes faint  after a medical procedure, including vaccination. Sitting or lying down for about 15 minutes can help prevent fainting, and injuries caused by a fall. Tell your doctor if you feel dizzy, or have vision changes or ringing in the ears.  Some people get severe pain in the shoulder and have difficulty moving the arm where a shot was given. This happens very rarely.  Any medication can cause a severe allergic reaction. Such reactions from a vaccine are very rare, estimated at about 1 in a million doses, and would happen within a few minutes to a few hours after the vaccination. As with any medicine, there is a very remote chance of a vaccine causing a serious injury or death. The safety of vaccines is always being monitored. For more information, visit: http://www.aguilar.org/ 5. What if there is a serious reaction? What should I look for?  Look for anything that concerns you, such as signs of a severe allergic reaction, very high fever, or unusual behavior. Signs of a severe allergic reaction can include hives, swelling of the face and throat, difficulty breathing, a fast heartbeat, dizziness, and weakness. These would start  a few minutes to a few hours after the vaccination. What should I do?  If you think it is a severe allergic reaction or other emergency that can't wait, call 9-1-1 and get the person to the nearest hospital. Otherwise, call your doctor.  Reactions should be reported to the Vaccine Adverse Event Reporting System (VAERS). Your doctor should file this report, or you can do it yourself through the VAERS web site at www.vaers.SamedayNews.es, or by calling 737-527-8984. VAERS does not give medical advice. 6. The National Vaccine Injury Compensation Program The [employer] Vaccine Injury Compensation Program (VICP) is a federal program that was created to compensate people who may have been injured by certain vaccines. Persons who believe they may have been injured by a vaccine can learn  about the program and about filing a claim by calling 4241282119 or visiting the [place] website at GoldCloset.com.ee. There is a time limit to file a claim for compensation. 7. How can I learn more?  Ask your healthcare provider. He or she can give you the vaccine package insert or suggest other sources of information.  Call your local or state health department.  Contact the Centers for Disease Control and Prevention (CDC):  Call (218)860-2804 (1-800-CDC-INFO) or  Visit CDC's website at https://gibson.com/ Vaccine Information Statement Inactivated Influenza Vaccine (10/24/2013)   This information is not intended to replace advice given to you by your health care provider. Make sure you discuss any questions you have with your health care provider.   Document Released: 12/29/2005 Document Revised: 03/27/2014 Document Reviewed: 10/27/2013 Elsevier Interactive Patient Education [employer].

## 2015-11-24 NOTE — Progress Notes (Addendum)
Subjective:  By signing my name below, I, Moises Blood, attest that this documentation has been prepared under the direction and in the presence of Reginia Forts, MD. Electronically Signed: Moises Blood, [place]. 11/24/2015 , 3:14 PM .  Patient was seen in Room 1 .   Patient ID: Taylor Powers, female    DOB: [birth year], 45 y.o.   MRN: [id number]  11/24/2015  Follow-up (PHYSICAL/FLU SHOT) and Other (TITERS FOR SCHOOL)   HPI Taylor Powers is a 45 y.o. female who presents to [facility] here for 6 month follow up.  She has forms to be filled out for school. She requires MMR and Hep B titers.   She's trying to start grad school but has trouble focusing and losing concentration easily. She doesn't have ADHD diagnosis. She's noticed not being able finish any projects after starting multiple projects. She also loses track of things easily. She denies concentration loss in the past while in school. Her father, brother and one of her children have ADHD. She has had a thyroidectomy in 2012 for thyroid cancer. She denies chest pain, constipation, nausea, vomiting, or diarrhea. She's not fasting today.  She is doing really well emotionally.  Last visit with Dr. Cruzita Lederer was 03/30/2015.  S/p thyroid US that was negative for residual or recurrent tissue.  Dr. Cruzita Lederer advised to return in six months.  She requests a flu shot today.   Review of Systems  Constitutional: Negative for chills, diaphoresis, fatigue, fever and unexpected weight change.  Eyes: Negative for visual disturbance.  Respiratory: Negative for cough, chest tightness and shortness of breath.   Cardiovascular: Negative for chest pain, palpitations and leg swelling.  Gastrointestinal: Negative for abdominal pain, blood in stool, constipation, diarrhea, nausea and vomiting.  Endocrine: Negative for cold intolerance, heat intolerance, polydipsia, polyphagia and polyuria.  Neurological: Negative for dizziness, tremors, seizures, syncope, facial  asymmetry, speech difficulty, weakness, light-headedness, numbness and headaches.  Psychiatric/Behavioral: Positive for decreased concentration. Negative for dysphoric mood, self-injury, sleep disturbance and suicidal ideas. The patient is not nervous/anxious.     Past Medical History:  Diagnosis Date  . Absence of menstruation   . Allergic rhinitis, cause unspecified   . Anxiety state, unspecified   . Arthritis    DDD L4-5; ruptured disc; laminectomy.  . Cancer ([place]) 09/18/2010   thyroid cancer; s/p thyroidectomy total; s/p ablation.  [place].  . Chicken pox   . Diabetes mellitus   . Dysplasia of cervix, unspecified   . Dysthymic disorder   . Hematuria, unspecified   . Hypersomnia, unspecified    night shift work  . Insomnia, unspecified   . Mixed hyperlipidemia   . Other abnormal glucose   . Other acne   . Thyroid disease   . Tobacco use disorder   . Unspecified vitamin D deficiency    Past Surgical History:  Procedure Laterality Date  . history of LEEP  2005  . LAMINECTOMY  1996   L4-L5  . lipoma of shoulder  2005   Right . removed  . SPINE SURGERY  03/20/1994   L4-L5 laminectomy  . TOTAL THYROIDECTOMY  2012  . TUBAL LIGATION  1998   Allergies  Allergen Reactions  . Sulfa Antibiotics Hives    All over body   Current Outpatient Prescriptions  Medication Sig Dispense Refill  . Armodafinil (NUVIGIL) 150 MG tablet Take 150 mg by mouth daily. Take once a week    . buPROPion (WELLBUTRIN XL) 150 MG 24 hr tablet TAKE 1 TABLET BY MOUTH  EVERY DAY 90 tablet 1  . fluticasone (FLONASE) 50 MCG/ACT nasal spray Place 2 sprays into both nostrils daily. 48 g 4  . levothyroxine (SYNTHROID, LEVOTHROID) 150 MCG tablet TAKE 1 TABLET BY MOUTH EVERY DAY BEFORE BREAKFAST  3  . levothyroxine (SYNTHROID, LEVOTHROID) 150 MCG tablet TAKE 1 TABLET BY MOUTH EVERY DAY BEFORE BREAKFAST 90 tablet 1  . metaxalone (SKELAXIN) 800 MG tablet Take 1 tablet (800 mg total) by mouth 3 (three) times daily.  (Patient taking differently: Take 800 mg by mouth as needed. ) 60 tablet 1  . modafinil (PROVIGIL) 100 MG tablet Take 1 tablet (100 mg total) by mouth daily. (Patient taking differently: Take 100 mg by mouth as needed. ) 30 tablet 5  . sertraline (ZOLOFT) 100 MG tablet TAKE 1 TABLET BY MOUTH EVERY DAY 90 tablet 3   No current facility-administered medications for this visit.    Social History   Social History  . Marital status: Married    Spouse name: Dawayne Cirri  . Number of children: 3  . Years of education: N/A   Occupational History  . 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 RN [employer]   Social History Main Topics  . Smoking status: Current Every Day Smoker    Packs/day: 0.50    Years: 27.00    Types: Cigarettes  . Smokeless tobacco: Never Used  . Alcohol use No  . Drug use: No  . Sexual activity: Yes     Comment: same sexual partner   Other Topics Concern  . Not on file   Social History Narrative   Marital status:  Married same sex partner 12/2013 Dawayne Cirri. Happy, no abuse.      Children:  3 children (20, 18, 16); 2 grandchild.        Lives: with wife.      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  Two sixteen hour shifts followed by 8 hour shift; weekends.  Off Mon-Thurs.  [place] job.        Tobacco: 1 ppd x 26 years.        Alcohol: none; partner in recovery.      Drugs: none      Exercise: none      Caffeine use: diet Pepsi and coffee.      Always uses seat belts.       Smoke alarm and carbon monoxide detector in the home.       No guns.    Family History  Problem Relation Age of Onset  . Depression Mother   . Kidney disease Mother     R Nephrectomy kidney stones  . Irritable bowel syndrome Mother   . Diabetes Father   . Depression Father   . Cancer Maternal Grandfather   . Bipolar disorder Son   . ADD / ADHD Son   . Hypertension Paternal Grandmother        Objective:    BP 122/80 (BP Location: Right Arm,  Patient Position: Sitting, Cuff Size: Large)   Pulse 92   Temp 98.1 F (36.7 C) (Oral)   Resp 18   Ht '5\' 7"'$  (1.702 m)   Wt 239 lb (108.4 kg)   LMP 03/05/2013   SpO2 100%   BMI 37.43 kg/m   Physical Exam  Constitutional: She is oriented to person, place, and time. She appears well-developed and well-nourished. No distress.  HENT:  Head: Normocephalic and atraumatic.  Right Ear: External ear normal.  Left Ear: External ear normal.  Nose: Nose normal.  Mouth/Throat: Oropharynx is clear and moist.  Eyes: Conjunctivae and EOM are normal. Pupils are equal, round, and reactive to light.  Neck: Normal range of motion. Neck supple. Carotid bruit is not present. No thyromegaly present.  Cardiovascular: Normal rate, regular rhythm, normal heart sounds and intact distal pulses.  Exam reveals no gallop and no friction rub.   No murmur heard. Pulmonary/Chest: Effort normal and breath sounds normal. No respiratory distress. She has no wheezes. She has no rales.  Abdominal: Soft. Bowel sounds are normal. She exhibits no distension and no mass. There is no tenderness. There is no rebound and no guarding.  Musculoskeletal: Normal range of motion. She exhibits no edema.  Lymphadenopathy:    She has no cervical adenopathy.  Neurological: She is alert and oriented to person, place, and time. No cranial nerve deficit.  Skin: Skin is warm and dry. No rash noted. She is not diaphoretic. No erythema. No pallor.  Psychiatric: She has a normal mood and affect. Her behavior is normal.  Nursing note and vitals reviewed.       Assessment & Plan:   1. Cancer of thyroid T3N0 S/P thyroidectomy 10/07/10   2. Postsurgical hypothyroidism   3. Glucose intolerance (impaired glucose tolerance)   4. Depression with anxiety   5. Cervical high risk HPV (human papillomavirus) test positive   6. Difficulty concentrating   7. Flu vaccine need   8. Immunity status testing    -s/p follow-up with endocrinology  regarding thyroid cancer; patient reporting that was medically cleared by endocrinology; will confirm this; obtain labs. -continue current medications. -highly recommend evaluation by [facility] or other psychologist for evaluation for adult ADHD; patient hesitant to undergo this evaluation and requesting stimulant or Strattera which I refused to prescribe without a full work up. -obtain immunity status for immunizations. -pap smear is normal yet HPV is persistent; will forward recent pap smear results to Dr. Charlesetta Garibaldi of gynecology.    Orders Placed This Encounter  Procedures  . Flu Vaccine QUAD 36+ mos IM  . Comprehensive metabolic panel  . Hemoglobin A1c  . TSH  . T4, free  . Measles/Mumps/Rubella Immunity  . Hepatitis B surface antibody  . POCT CBC  . POCT urinalysis dipstick  . POCT Microscopic Urinalysis ([facility])   No orders of the defined types were placed in this encounter.   Return in about 6 months (around 05/23/2016) for complete physical examiniation.   I personally performed the services described in this documentation, which was scribed in my presence. The recorded information has been reviewed and considered.  Kristi Elayne Guerin, M.D. [facility] [address] [place], [place]  [id number] [phone] phone [phone] fax

## 2015-11-25 LAB — HEMOGLOBIN A1C
HEMOGLOBIN A1C: 5.3 % (ref <5.7)
MEAN PLASMA GLUCOSE: 105 mg/dL

## 2015-11-25 LAB — TSH: TSH: 2.46 m[IU]/L

## 2015-11-25 LAB — COMPREHENSIVE METABOLIC PANEL
ALBUMIN: 4.4 g/dL (ref 3.6–5.1)
ALT: 33 U/L — ABNORMAL HIGH (ref 6–29)
AST: 21 U/L (ref 10–35)
Alkaline Phosphatase: 78 U/L (ref 33–115)
BUN: 16 mg/dL (ref 7–25)
CHLORIDE: 105 mmol/L (ref 98–110)
CO2: 23 mmol/L (ref 20–31)
Calcium: 9.3 mg/dL (ref 8.6–10.2)
Creat: 0.77 mg/dL (ref 0.50–1.10)
Glucose, Bld: 102 mg/dL — ABNORMAL HIGH (ref 65–99)
POTASSIUM: 4 mmol/L (ref 3.5–5.3)
Sodium: 137 mmol/L (ref 135–146)
Total Bilirubin: 0.3 mg/dL (ref 0.2–1.2)
Total Protein: 7.3 g/dL (ref 6.1–8.1)

## 2015-11-25 LAB — HEPATITIS B SURFACE ANTIBODY, QUANTITATIVE: Hepatitis B-Post: 182 m[IU]/mL

## 2015-11-25 LAB — T4, FREE: FREE T4: 1.1 ng/dL (ref 0.8–1.8)

## 2015-11-25 LAB — MEASLES/MUMPS/RUBELLA IMMUNITY
MUMPS IGG: 89.8 [AU]/ml — AB (ref <9.00)
RUBEOLA IGG: 214 [AU]/ml — AB (ref <25.00)
Rubella: 3.59 Index — ABNORMAL HIGH (ref <0.90)

## 2016-02-15 ENCOUNTER — Other Ambulatory Visit: Payer: Self-pay | Admitting: Family Medicine

## 2016-02-16 NOTE — Telephone Encounter (Signed)
11/2015 last ov and nl tsh

## 2016-05-17 ENCOUNTER — Other Ambulatory Visit: Payer: Self-pay | Admitting: Family Medicine

## 2016-08-02 ENCOUNTER — Ambulatory Visit: Payer: BLUE CROSS/BLUE SHIELD | Admitting: Physician Assistant

## 2016-08-07 ENCOUNTER — Encounter: Payer: Self-pay | Admitting: Physician Assistant

## 2016-08-07 ENCOUNTER — Ambulatory Visit (INDEPENDENT_AMBULATORY_CARE_PROVIDER_SITE_OTHER): Payer: BLUE CROSS/BLUE SHIELD | Admitting: Physician Assistant

## 2016-08-07 VITALS — BP 108/76 | HR 98 | Resp 16 | Ht 66.93 in | Wt 238.2 lb

## 2016-08-07 DIAGNOSIS — Z111 Encounter for screening for respiratory tuberculosis: Secondary | ICD-10-CM | POA: Diagnosis not present

## 2016-08-07 NOTE — Patient Instructions (Addendum)
Follow up in 48-72 hours for skin test read.   Tuberculin Skin Test Why am I having this test? Tuberculosis (TB) is a bacterial infection caused by Mycobacterium tuberculosis. Most people who are exposed to these bacteria have a strong enough defense (immune) system to prevent the bacteria from causing TB and developing symptoms. Their bodies prevent the germs from being active and making them sick (latent TB infection). However, if you have TB germs in your body and your immune system is weak, you can develop a TB infection. This can cause symptoms such as:  Night sweats.  Fever.  Weakness.  Weight loss. A latent TB infection can also become active later in life if your immune system becomes weakened or compromised. You may have this test if your health care provider suspects that you have TB. You may also have this test to screen for TB if you are at risk for getting the disease. Those at increased risk include:  People who inject illegal drugs or share needles.  People with HIV or other diseases that affect immunity.  Health care workers.  People who live in high-risk communities, such as homeless shelters, nursing homes, and correctional facilities.  People who have been in contact with someone with TB.  People from countries where TB is more common. If you are in a high-risk group, your health care provider may wish to screen for TB more often. This can help prevent the spread of the disease. Sometimes TB screening is required when starting a new job, such as becoming a Public house manager or a Pharmacist, hospital. Colleges or universities may require it of new students. What is being tested? A tuberculin skin test is the main test used to check for exposure to the bacteria that can cause TB. The test checks for antibodies to the bacteria. Antibodies are proteins that your body produces to protect you from germs and other things that can make you sick. Your health care provider will inject a  solution known as PPD (purified protein derivative) under the first layer of skin on your arm. This causes a blister-like bubble to form at the site. Your health care provider will then examine the site after a number of hours have passed to see if a reaction has occurred. How do I prepare for this test? There is no preparation required for this test. What do the results mean? Your test results will be reported as either negative or positive. If the tuberculin skin test produces a negative result, it is likely that you do not have TB and have not been exposed to the TB bacteria. If you or your health care provider suspects exposure, however, you may want to repeat the test a few weeks later. A blood test may also be used to check for TB. This is because you will not react to the tuberculin skin test until several weeks after exposure to TB bacteria. If you test positive to the tuberculin skin test, it is likely that you have been exposed to TB bacteria. The test does not distinguish between an active and a latent TB infection. A false-positive result can occur. A false-positive result for TB bacteria is incorrect because it indicates a condition or finding is present when it is not. Talk to your health care provider to discuss your results, treatment options, and if necessary, the need for more tests. It is your responsibility to obtain your test results. Ask the lab or department performing the test when and how you  will get your results. Talk with your health care provider if you have any questions about your results. Talk with your health care provider to discuss your results, treatment options, and if necessary, the need for more tests. Talk with your health care provider if you have any questions about your results. This information is not intended to replace advice given to you by your health care provider. Make sure you discuss any questions you have with your health care provider. Document  Released: 12/14/2004 Document Revised: 11/07/2015 Document Reviewed: 06/30/2013 Elsevier Interactive Patient Education  2017 [employer].   IF you received an x-ray today, you will receive an invoice from [facility]. Please contact [facility] at [phone] with questions or concerns regarding your invoice.   IF you received labwork today, you will receive an invoice from [place]. Please contact [facility] at [phone] with questions or concerns regarding your invoice.   Our billing staff will not be able to assist you with questions regarding bills from these companies.  You will be contacted with the lab results as soon as they are available. The fastest way to get your results is to activate your My Chart account. Instructions are located on the last page of this paperwork. If you have not heard from [place] regarding the results in 2 weeks, please contact this office.

## 2016-08-07 NOTE — Progress Notes (Signed)
  Tuberculosis Risk Questionnaire  1. No Were you born outside the [place] in one of the following parts of the world: [place], [place], [place], [place] or [place]?    2. No Have you traveled outside the [place] and lived for more than one month in one of the following parts of the world: [place], [place], [place], [place] or [place]?    3. No Do you have a compromised immune system such as from any of the following conditions:HIV/AIDS, organ or bone marrow transplantation, diabetes, immunosuppressive medicines (e.g. Prednisone, Remicaide), leukemia, lymphoma, cancer of the head or neck, gastrectomy or jejunal bypass, end-stage renal disease (on dialysis), or silicosis?     4. Yes: Works at [employer] Have you ever or do you plan on working in: a residential care center, a health care facility, a jail or prison or homeless shelter?    5. No Have you ever: injected illegal drugs, used crack cocaine, lived in a homeless shelter  or been in jail or prison?     6. Yes, greater than 10 years ago at work.  Have you ever been exposed to anyone with infectious tuberculosis?    Tuberculosis Symptom Questionnaire  Do you currently have any of the following symptoms?  1. No Unexplained cough lasting more than 3 weeks?   2. No Unexplained fever lasting more than 3 weeks.   3. No Night Sweats (sweating that leaves the bedclothes and sheets wet)     4. No Shortness of Breath   5. No Chest Pain   6. No Unintentional weight loss    7. No Unexplained fatigue (very tired for no reason)

## 2016-08-07 NOTE — Progress Notes (Signed)
Taylor Powers  MRN: [id number] DOB: Sep 21, 1970  Subjective:  Taylor Powers is a 46 y.o. female seen in office today for a chief complaint of need of TB skin test. 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. She gets TB skin tests yearly. Has never tested positive or had a reaction to the TB skin test. She is currently healthy right now. Current every day smoker. No other concerns or complaints.   Review of Systems  Constitutional: Negative for chills, diaphoresis and fever.  HENT: Negative for congestion.   Respiratory: Negative for cough and shortness of breath.   Cardiovascular: Negative for chest pain and palpitations.    Patient Active Problem List   Diagnosis Date Noted  . Postsurgical hypothyroidism 03/30/2015  . Glucose intolerance (impaired glucose tolerance) 10/14/2014  . Hematuria 10/14/2014  . Depression with anxiety 05/20/2013  . Allergic rhinitis 05/20/2013  . Shift work sleep disorder 05/20/2013  . Snoring 05/20/2013  . Pure hypercholesterolemia 05/20/2013  . Cervical high risk HPV (human papillomavirus) test positive 05/20/2013  . Routine general medical examination at a health care facility 06/27/2012  . Routine gynecological examination 06/27/2012  . Cancer of thyroid T3N0 S/P thyroidectomy 10/07/10 11/03/2010    Current Outpatient Prescriptions on File Prior to Visit  Medication Sig Dispense Refill  . Armodafinil (NUVIGIL) 150 MG tablet Take 150 mg by mouth daily. Take once a week    . buPROPion (WELLBUTRIN XL) 150 MG 24 hr tablet TAKE 1 TABLET BY MOUTH EVERY DAY 90 tablet 0  . fluticasone (FLONASE) 50 MCG/ACT nasal spray Place 2 sprays into both nostrils daily. 48 g 4  . levothyroxine (SYNTHROID, LEVOTHROID) 150 MCG tablet TAKE 1 TABLET BY MOUTH EVERY DAY BEFORE BREAKFAST  3  . metaxalone (SKELAXIN) 800 MG tablet Take 1 tablet (800 mg total) by mouth 3 (three) times daily. (Patient taking differently: Take 800 mg by mouth as  needed. ) 60 tablet 1  . modafinil (PROVIGIL) 100 MG tablet Take 1 tablet (100 mg total) by mouth daily. (Patient taking differently: Take 100 mg by mouth as needed. ) 30 tablet 5  . sertraline (ZOLOFT) 100 MG tablet TAKE 1 TABLET BY MOUTH EVERY DAY 90 tablet 2   No current facility-administered medications on file prior to visit.     Allergies  Allergen Reactions  . Sulfa Antibiotics Hives    All over body     Objective:  BP 108/76 (BP Location: Right Arm, Patient Position: Sitting, Cuff Size: Large)   Pulse 98   Resp 16   Ht 5' 6.93" (1.7 m)   Wt 238 lb 3.2 oz (108 kg)   LMP 03/05/2013   SpO2 96%   BMI 37.39 kg/m   Physical Exam  Constitutional: She is oriented to person, place, and time and well-developed, well-nourished, and in no distress.  HENT:  Head: Normocephalic and atraumatic.  Eyes: Conjunctivae are normal.  Neck: Normal range of motion.  Cardiovascular: Normal rate, regular rhythm and normal heart sounds.   Pulmonary/Chest: Effort normal and breath sounds normal. She has no wheezes. She has no rales.  Neurological: She is alert and oriented to person, place, and time. Gait normal.  Skin: Skin is warm and dry.  Psychiatric: Affect normal.  Vitals reviewed.   Assessment and Plan :  1. Visit for TB skin test Follow up in 48-72 hours for skin test reading.  - TB Skin Test  Tenna Delaine, PA-C  Primary Care  at [facility] 08/07/2016 9:05 AM

## 2016-08-09 ENCOUNTER — Ambulatory Visit: Payer: BLUE CROSS/BLUE SHIELD | Admitting: Physician Assistant

## 2016-08-09 DIAGNOSIS — Z111 Encounter for screening for respiratory tuberculosis: Secondary | ICD-10-CM

## 2016-08-09 LAB — TB SKIN TEST: TB SKIN TEST: NEGATIVE

## 2016-08-09 NOTE — Progress Notes (Signed)
I did not personally examine this patient.  

## 2016-08-24 ENCOUNTER — Other Ambulatory Visit: Payer: Self-pay | Admitting: Family Medicine

## 2016-10-02 ENCOUNTER — Encounter: Payer: Self-pay | Admitting: Family Medicine

## 2016-10-02 ENCOUNTER — Other Ambulatory Visit: Payer: Self-pay | Admitting: Physician Assistant

## 2016-10-07 MED ORDER — BUPROPION HCL ER (XL) 150 MG PO TB24: 150.0000 mg | ORAL_TABLET | Freq: Every day | ORAL | 0 refills | Status: DC

## 2016-10-09 MED ORDER — METAXALONE 800 MG PO TABS: 800.0000 mg | ORAL_TABLET | Freq: Three times a day (TID) | ORAL | 0 refills | Status: DC | PRN

## 2016-10-10 ENCOUNTER — Other Ambulatory Visit: Payer: Self-pay | Admitting: Family Medicine

## 2016-10-10 DIAGNOSIS — Z1231 Encounter for screening mammogram for malignant neoplasm of breast: Secondary | ICD-10-CM

## 2016-10-11 ENCOUNTER — Encounter: Payer: Self-pay | Admitting: Family Medicine

## 2016-10-11 ENCOUNTER — Ambulatory Visit (INDEPENDENT_AMBULATORY_CARE_PROVIDER_SITE_OTHER): Payer: BC Managed Care – PPO | Admitting: Family Medicine

## 2016-10-11 VITALS — BP 113/77 | HR 95 | Temp 98.0°F | Ht 68.11 in | Wt 242.0 lb

## 2016-10-11 DIAGNOSIS — M545 Low back pain, unspecified: Secondary | ICD-10-CM

## 2016-10-11 DIAGNOSIS — E7439 Other disorders of intestinal carbohydrate absorption: Secondary | ICD-10-CM

## 2016-10-11 DIAGNOSIS — Z111 Encounter for screening for respiratory tuberculosis: Secondary | ICD-10-CM | POA: Diagnosis not present

## 2016-10-11 DIAGNOSIS — G4726 Circadian rhythm sleep disorder, shift work type: Secondary | ICD-10-CM

## 2016-10-11 DIAGNOSIS — C73 Malignant neoplasm of thyroid gland: Secondary | ICD-10-CM | POA: Diagnosis not present

## 2016-10-11 DIAGNOSIS — G8929 Other chronic pain: Secondary | ICD-10-CM | POA: Diagnosis not present

## 2016-10-11 DIAGNOSIS — E890 Postprocedural hypothyroidism: Secondary | ICD-10-CM

## 2016-10-11 DIAGNOSIS — F418 Other specified anxiety disorders: Secondary | ICD-10-CM | POA: Diagnosis not present

## 2016-10-11 MED ORDER — MODAFINIL 100 MG PO TABS: 100.0000 mg | ORAL_TABLET | Freq: Every day | ORAL | 5 refills | Status: DC

## 2016-10-11 MED ORDER — METAXALONE 800 MG PO TABS: 800.0000 mg | ORAL_TABLET | Freq: Three times a day (TID) | ORAL | 2 refills | Status: DC | PRN

## 2016-10-11 NOTE — Progress Notes (Signed)
Subjective:    Patient ID: Taylor Powers, female    DOB: Jul 08, 1970, 46 y.o.   MRN: [id number]  10/11/2016  Hyperlipidemia (follow-up); Hypothyroidism; and Anxiety   HPI This 46 y.o. female presents for evaluation of anxiety/depression, hypercholesterolemia, glucose intolerance. Patient reports good compliance with medication, good tolerance to medication, and good symptom control.    Works night shift.  Requesting Provigil refill.  Only takes when drives home on work days.  Also needs Tb skin testing for new employment and school.   Lower back pain: continues to suffer with intermittent lower back pain; no radiation into legs; no n/t/w.  No saddle paresthesias; no b/b dysfunction.   BP Readings from Last 3 Encounters:  10/11/16 113/77  08/07/16 108/76  11/24/15 122/80   Wt Readings from Last 3 Encounters:  10/11/16 242 lb (109.8 kg)  08/07/16 238 lb 3.2 oz (108 kg)  11/24/15 239 lb (108.4 kg)   Immunization History  Administered Date(s) Administered  . Influenza Split 12/18/2012  . Influenza,inj,Quad PF,36+ Mos 11/24/2015  . Influenza-Unspecified 12/18/2013, 01/14/2015  . PPD Test 12/21/2011, 06/08/2015, 06/19/2015, 08/07/2016  . Pneumococcal Polysaccharide-23 05/04/2015  . Td 03/21/2003, 03/20/2010  . Tdap 03/20/2010      Review of Systems  Constitutional: Negative for chills, diaphoresis, fatigue and fever.  Eyes: Negative for visual disturbance.  Respiratory: Negative for cough and shortness of breath.   Cardiovascular: Negative for chest pain, palpitations and leg swelling.  Gastrointestinal: Negative for abdominal pain, constipation, diarrhea, nausea and vomiting.  Endocrine: Negative for cold intolerance, heat intolerance, polydipsia, polyphagia and polyuria.  Musculoskeletal: Positive for back pain.  Neurological: Negative for dizziness, tremors, seizures, syncope, facial asymmetry, speech difficulty, weakness, light-headedness, numbness and headaches.    Psychiatric/Behavioral: Positive for dysphoric mood. The patient is not nervous/anxious.     Past Medical History:  Diagnosis Date  . Absence of menstruation   . Allergic rhinitis, cause unspecified   . Anxiety state, unspecified   . Arthritis    DDD L4-5; ruptured disc; laminectomy.  . Cancer ([place]) 09/18/2010   thyroid cancer; s/p thyroidectomy total; s/p ablation.  [place].  . Chicken pox   . Diabetes mellitus   . Dysplasia of cervix, unspecified   . Dysthymic disorder   . Hematuria, unspecified   . Hypersomnia, unspecified    night shift work  . Insomnia, unspecified   . Mixed hyperlipidemia   . Other abnormal glucose   . Other acne   . Thyroid disease   . Tobacco use disorder   . Unspecified vitamin D deficiency    Past Surgical History:  Procedure Laterality Date  . history of LEEP  2005  . LAMINECTOMY  1996   L4-L5  . lipoma of shoulder  2005   Right . removed  . SPINE SURGERY  03/20/1994   L4-L5 laminectomy  . TOTAL THYROIDECTOMY  2012  . TUBAL LIGATION  1998   Allergies  Allergen Reactions  . Sulfa Antibiotics Hives    All over body    Social History   Social History  . Marital status: Married    Spouse name: Dawayne Cirri  . Number of children: 3  . Years of education: N/A   Occupational History  . Psych ED [place]  . RN [employer]   Social History Main Topics  . Smoking status: Current Every Day Smoker    Packs/day: 0.50    Years: 27.00    Types: Cigarettes  . Smokeless tobacco: Never Used  . Alcohol  use No  . Drug use: No  . Sexual activity: Yes     Comment: same sexual partner   Other Topics Concern  . Not on file   Social History Narrative   Marital status:  Married same sex partner 12/2013 Dawayne Cirri. Happy, no abuse.      Children:  3 children (20, 18, 16); 2 grandchild.        Lives: with wife.      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  Two sixteen hour shifts followed  by 8 hour shift; weekends.  Off Mon-Thurs.  [place] job.        Tobacco: 1 ppd x 26 years.        Alcohol: none; partner in recovery.      Drugs: none      Exercise: none      Caffeine use: diet Pepsi and coffee.      Always uses seat belts.       Smoke alarm and carbon monoxide detector in the home.       No guns.    Family History  Problem Relation Age of Onset  . Depression Mother   . Kidney disease Mother        R Nephrectomy kidney stones  . Irritable bowel syndrome Mother   . Diabetes Father   . Depression Father   . Cancer Maternal Grandfather   . Bipolar disorder Son   . ADD / ADHD Son   . Hypertension Paternal Grandmother        Objective:    BP 113/77   Pulse 95   Temp 98 F (36.7 C) (Oral)   Ht 5' 8.11" (1.73 m)   Wt 242 lb (109.8 kg)   LMP 03/05/2013   BMI 36.68 kg/m  Physical Exam  Constitutional: She is oriented to person, place, and time. She appears well-developed and well-nourished. No distress.  HENT:  Head: Normocephalic and atraumatic.  Right Ear: External ear normal.  Left Ear: External ear normal.  Nose: Nose normal.  Mouth/Throat: Oropharynx is clear and moist.  Eyes: Pupils are equal, round, and reactive to light. Conjunctivae and EOM are normal.  Neck: Normal range of motion. Neck supple. Carotid bruit is not present. No thyromegaly present.  Cardiovascular: Normal rate, regular rhythm, normal heart sounds and intact distal pulses.  Exam reveals no gallop and no friction rub.   No murmur heard. Pulmonary/Chest: Effort normal and breath sounds normal. She has no wheezes. She has no rales.  Abdominal: Soft. Bowel sounds are normal. She exhibits no distension and no mass. There is no tenderness. There is no rebound and no guarding.  Lymphadenopathy:    She has no cervical adenopathy.  Neurological: She is alert and oriented to person, place, and time. No cranial nerve deficit.  Skin: Skin is warm and dry. No rash noted. She is not diaphoretic.  No erythema. No pallor.  Psychiatric: She has a normal mood and affect. Her behavior is normal.   Results for orders placed or performed in visit on 10/11/16  Quantiferon tb gold assay  Result Value Ref Range   QUANTIFERON INCUBATION Comment   CBC with Differential/Platelet  Result Value Ref Range   WBC 6.2 3.4 - 10.8 x10E3/uL   RBC 4.24 3.77 - 5.28 x10E6/uL   Hemoglobin 12.4 11.1 - 15.9 g/dL   Hematocrit 37.2 34.0 - 46.6 %   MCV 88 79 - 97 fL   MCH 29.2 26.6 -  33.0 pg   MCHC 33.3 31.5 - 35.7 g/dL   RDW 13.2 12.3 - 15.4 %   Platelets 191 150 - 379 x10E3/uL   Neutrophils 50 Not Estab. %   Lymphs 41 Not Estab. %   Monocytes 6 Not Estab. %   Eos 3 Not Estab. %   Basos 0 Not Estab. %   Neutrophils Absolute 3.1 1.4 - 7.0 x10E3/uL   Lymphocytes Absolute 2.5 0.7 - 3.1 x10E3/uL   Monocytes Absolute 0.4 0.1 - 0.9 x10E3/uL   EOS (ABSOLUTE) 0.2 0.0 - 0.4 x10E3/uL   Basophils Absolute 0.0 0.0 - 0.2 x10E3/uL   Immature Granulocytes 0 Not Estab. %   Immature Grans (Abs) 0.0 0.0 - 0.1 x10E3/uL  Comprehensive metabolic panel  Result Value Ref Range   Glucose 107 (H) 65 - 99 mg/dL   BUN 9 6 - 24 mg/dL   Creatinine, Ser 0.80 0.57 - 1.00 mg/dL   GFR calc non Af Amer 89 >59 mL/min/1.73   GFR calc Af Amer 102 >59 mL/min/1.73   BUN/Creatinine Ratio 11 9 - 23   Sodium 140 134 - 144 mmol/L   Potassium 4.0 3.5 - 5.2 mmol/L   Chloride 102 96 - 106 mmol/L   CO2 25 20 - 29 mmol/L   Calcium 9.4 8.7 - 10.2 mg/dL   Total Protein 7.1 6.0 - 8.5 g/dL   Albumin 4.5 3.5 - 5.5 g/dL   Globulin, Total 2.6 1.5 - 4.5 g/dL   Albumin/Globulin Ratio 1.7 1.2 - 2.2   Bilirubin Total <0.2 0.0 - 1.2 mg/dL   Alkaline Phosphatase 98 39 - 117 IU/L   AST 26 0 - 40 IU/L   ALT 46 (H) 0 - 32 IU/L  Hemoglobin A1c  Result Value Ref Range   Hgb A1c MFr Bld 5.5 4.8 - 5.6 %   Est. average glucose Bld gHb Est-mCnc 111 mg/dL  TSH  Result Value Ref Range   TSH 3.150 0.450 - 4.500 uIU/mL  T4, free  Result Value Ref Range    Free T4 0.98 0.82 - 1.77 ng/dL  QuantiFERON In Tube  Result Value Ref Range   QUANTIFERON TB GOLD Negative Negative   QUANTIFERON CRITERIA Comment    QUANTIFERON TB AG VALUE 0.11 IU/mL   Quantiferon Nil Value 0.11 IU/mL   QUANTIFERON MITOGEN VALUE >10.00 IU/mL   QFT TB AG MINUS NIL VALUE 0.00 IU/mL   Interpretation: Comment    Depression screen [facility] 2/9 10/11/2016 08/07/2016 11/24/2015 06/08/2015 05/04/2015  Decreased Interest 0 0 0 0 0  Down, Depressed, Hopeless 0 0 0 0 0  PHQ - 2 Score 0 0 0 0 0       Assessment & Plan:   1. Depression with anxiety   2. Postoperative hypothyroidism   3. Glucose intolerance   4. Shift work sleep disorder   5. Screening-pulmonary TB   6. Cancer of thyroid T3N0 S/P thyroidectomy 10/07/10   7. Chronic midline low back pain without sciatica    -depression and anxiety stable; continue current medications. -followed by endocrinology for history of thyroid cancer; upcoming appointment. -ongoing shift work sleep disorder; rx for Provigil provided. -obtain screening Tb gold. -intermittent lower back pain; refill of Skelaxin provided; recommend stretches, weight loss, and core body strengthening.   Orders Placed This Encounter  Procedures  . Quantiferon tb gold assay  . CBC with Differential/Platelet  . Comprehensive metabolic panel  . Hemoglobin A1c  . TSH  . T4, free  . QuantiFERON In Tube  Meds ordered this encounter  Medications  . modafinil (PROVIGIL) 100 MG tablet    Sig: Take 1 tablet (100 mg total) by mouth daily.    Dispense:  30 tablet    Refill:  5  . metaxalone (SKELAXIN) 800 MG tablet    Sig: Take 1 tablet (800 mg total) by mouth 3 (three) times daily as needed for muscle spasms.    Dispense:  60 tablet    Refill:  2    Return in about 3 months (around 01/11/2017) for complete physical examiniation.   Dhairya Corales Elayne Guerin, M.D. Primary Care at [facility] [address] [place], [place]  [address] [phone] phone [phone] fax

## 2016-10-11 NOTE — Patient Instructions (Signed)
     IF you received an x-ray today, you will receive an invoice from [facility]. Please contact [facility] at [phone] with questions or concerns regarding your invoice.   IF you received labwork today, you will receive an invoice from [facility]. Please contact [facility] at [phone] with questions or concerns regarding your invoice.   Our billing staff will not be able to assist you with questions regarding bills from these companies.  You will be contacted with the lab results as soon as they are available. The fastest way to get your results is to activate your My Chart account. Instructions are located on the last page of this paperwork. If you have not heard from us regarding the results in 2 weeks, please contact this office.     

## 2016-10-12 LAB — CBC WITH DIFFERENTIAL/PLATELET
BASOS ABS: 0 10*3/uL (ref 0.0–0.2)
Basos: 0 %
EOS (ABSOLUTE): 0.2 10*3/uL (ref 0.0–0.4)
Eos: 3 %
HEMOGLOBIN: 12.4 g/dL (ref 11.1–15.9)
Hematocrit: 37.2 % (ref 34.0–46.6)
IMMATURE GRANS (ABS): 0 10*3/uL (ref 0.0–0.1)
Immature Granulocytes: 0 %
LYMPHS: 41 %
Lymphocytes Absolute: 2.5 10*3/uL (ref 0.7–3.1)
MCH: 29.2 pg (ref 26.6–33.0)
MCHC: 33.3 g/dL (ref 31.5–35.7)
MCV: 88 fL (ref 79–97)
MONOCYTES: 6 %
Monocytes Absolute: 0.4 10*3/uL (ref 0.1–0.9)
NEUTROS ABS: 3.1 10*3/uL (ref 1.4–7.0)
Neutrophils: 50 %
PLATELETS: 191 10*3/uL (ref 150–379)
RBC: 4.24 x10E6/uL (ref 3.77–5.28)
RDW: 13.2 % (ref 12.3–15.4)
WBC: 6.2 10*3/uL (ref 3.4–10.8)

## 2016-10-12 LAB — COMPREHENSIVE METABOLIC PANEL
ALBUMIN: 4.5 g/dL (ref 3.5–5.5)
ALK PHOS: 98 IU/L (ref 39–117)
ALT: 46 IU/L — ABNORMAL HIGH (ref 0–32)
AST: 26 IU/L (ref 0–40)
Albumin/Globulin Ratio: 1.7 (ref 1.2–2.2)
BUN/Creatinine Ratio: 11 (ref 9–23)
BUN: 9 mg/dL (ref 6–24)
CHLORIDE: 102 mmol/L (ref 96–106)
CO2: 25 mmol/L (ref 20–29)
CREATININE: 0.8 mg/dL (ref 0.57–1.00)
Calcium: 9.4 mg/dL (ref 8.7–10.2)
GFR calc Af Amer: 102 mL/min/{1.73_m2} (ref >59)
GFR calc non Af Amer: 89 mL/min/{1.73_m2} (ref >59)
GLUCOSE: 107 mg/dL — AB (ref 65–99)
Globulin, Total: 2.6 g/dL (ref 1.5–4.5)
Potassium: 4 mmol/L (ref 3.5–5.2)
Sodium: 140 mmol/L (ref 134–144)
TOTAL PROTEIN: 7.1 g/dL (ref 6.0–8.5)

## 2016-10-12 LAB — HEMOGLOBIN A1C
Est. average glucose Bld gHb Est-mCnc: 111 mg/dL
HEMOGLOBIN A1C: 5.5 % (ref 4.8–5.6)

## 2016-10-12 LAB — TSH: TSH: 3.15 u[IU]/mL (ref 0.450–4.500)

## 2016-10-12 LAB — T4, FREE: FREE T4: 0.98 ng/dL (ref 0.82–1.77)

## 2016-10-15 LAB — QUANTIFERON IN TUBE
QFT TB AG MINUS NIL VALUE: 0 IU/mL
QUANTIFERON MITOGEN VALUE: >10 IU/mL
QUANTIFERON TB AG VALUE: 0.11 [IU]/mL
QUANTIFERON TB GOLD: NEGATIVE
Quantiferon Nil Value: 0.11 IU/mL

## 2016-10-15 LAB — QUANTIFERON TB GOLD ASSAY (BLOOD)

## 2016-10-20 ENCOUNTER — Telehealth: Payer: Self-pay

## 2016-10-20 NOTE — Telephone Encounter (Signed)
Filled out PA with cover my meds yesterday.  Today I received notification that modafinil is approved from 10/19/2016-10/19/2017. A letter was sent to patient by [facility] to notify the patient.

## 2016-10-31 ENCOUNTER — Ambulatory Visit: Payer: BLUE CROSS/BLUE SHIELD

## 2016-12-13 ENCOUNTER — Ambulatory Visit: Payer: Self-pay | Admitting: Family Medicine

## 2016-12-27 ENCOUNTER — Ambulatory Visit: Payer: BC Managed Care – PPO | Admitting: Family Medicine

## 2017-01-05 ENCOUNTER — Other Ambulatory Visit: Payer: Self-pay | Admitting: Family Medicine

## 2017-02-11 IMAGING — US US SOFT TISSUE HEAD/NECK
1 series · 14 of 25 positions shown · non-contrast
Comparison: Scintigraphy 12/26/2010 and previous studies

CLINICAL DATA: Thyroid carcinoma, post thyroidectomy.

EXAM:
THYROID ULTRASOUND
TECHNIQUE: Ultrasound examination of the thyroid gland and adjacent soft
tissues was performed.

[Series 1: us soft tissue head/neck · 0.06mm/px · 14 of 26 slices shown]
[im 1/26]
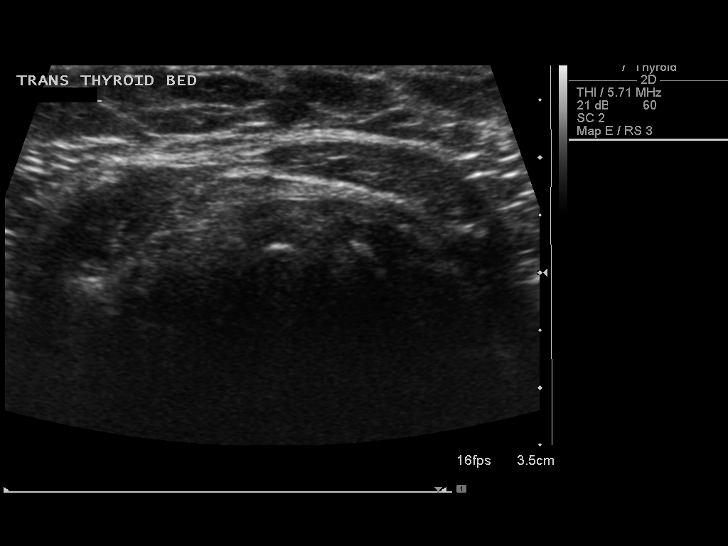
[im 3/26]
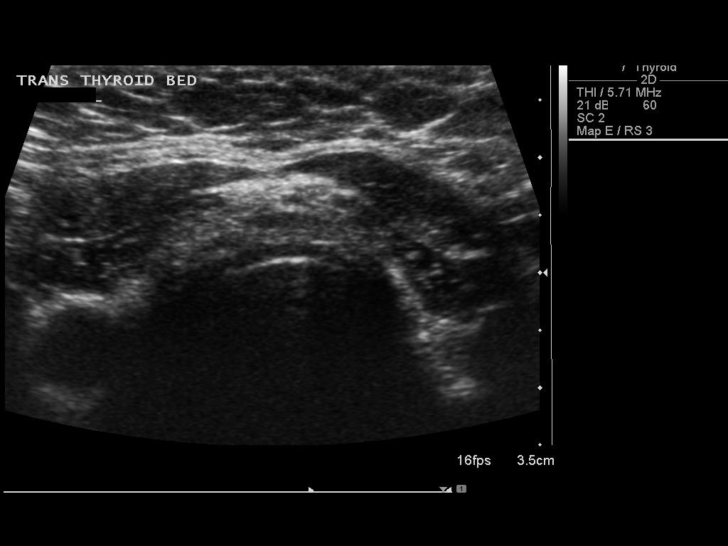
[im 5/26]
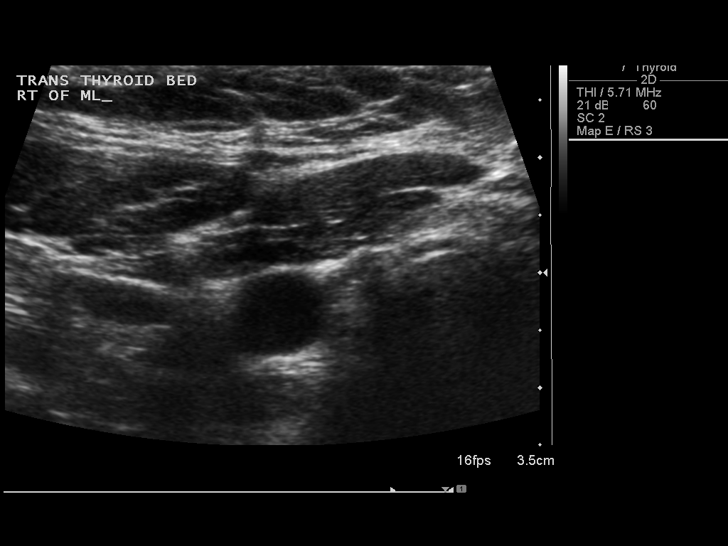
[im 7/26]
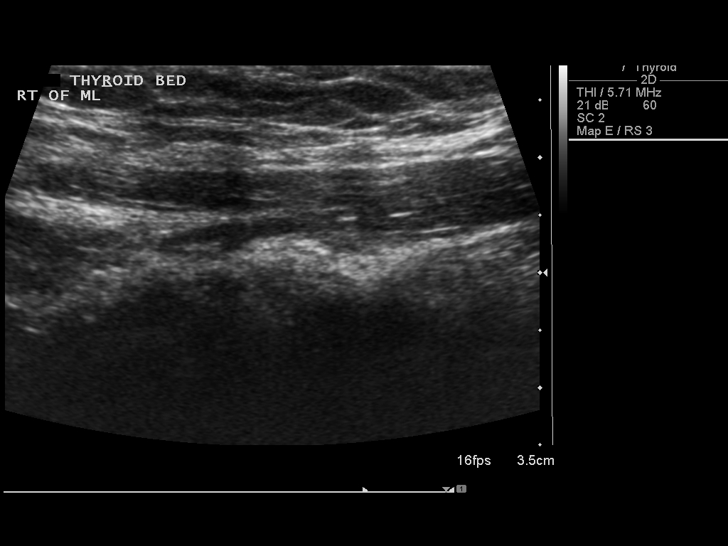
[im 9/26]
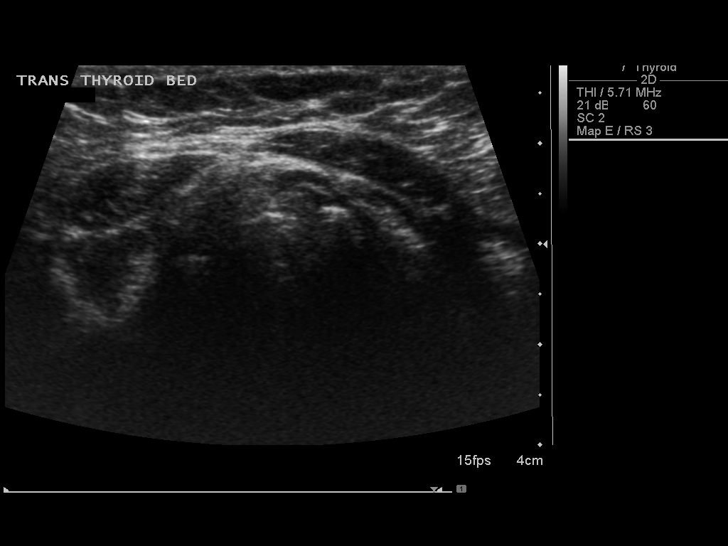
[im 10/26]
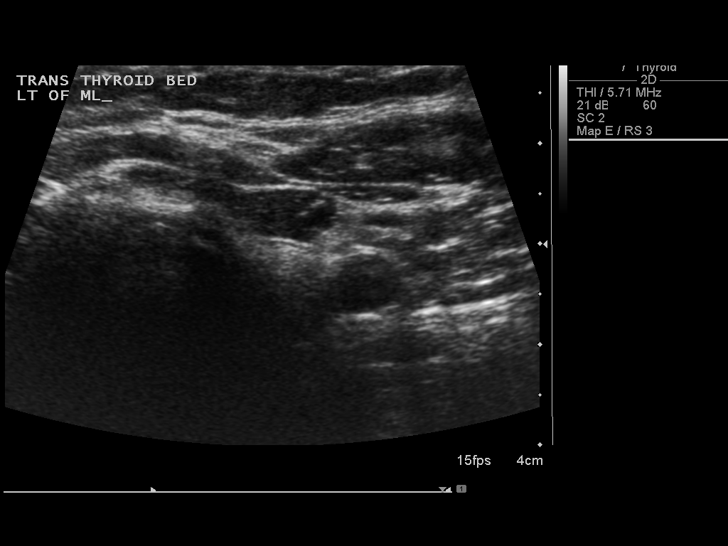
[im 12/26]
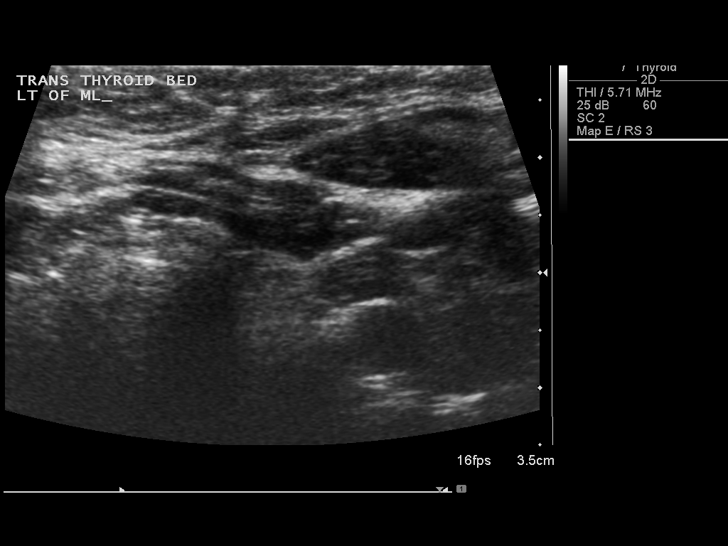
[im 14/26]
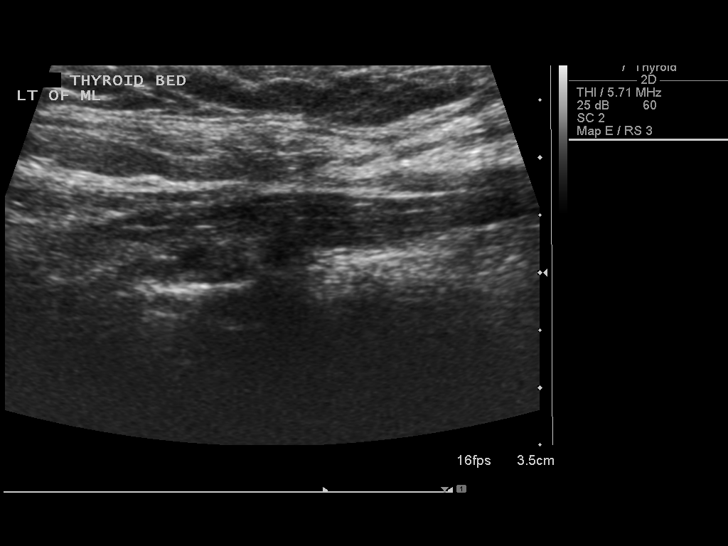
[im 16/26]
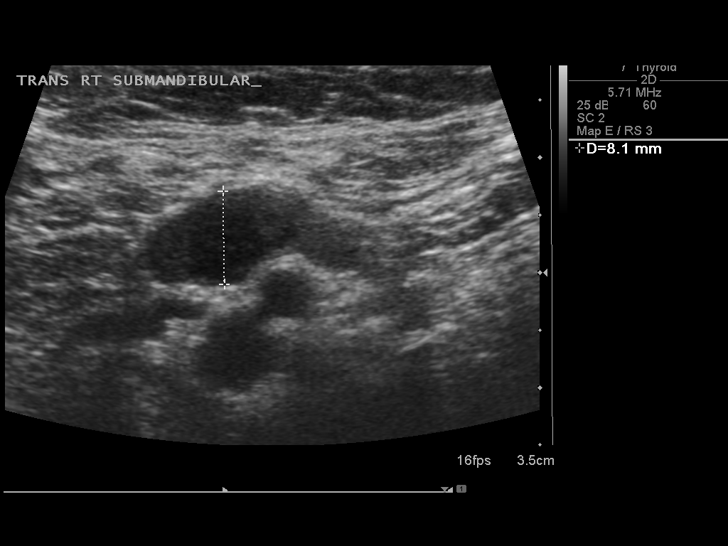
[im 17/26]
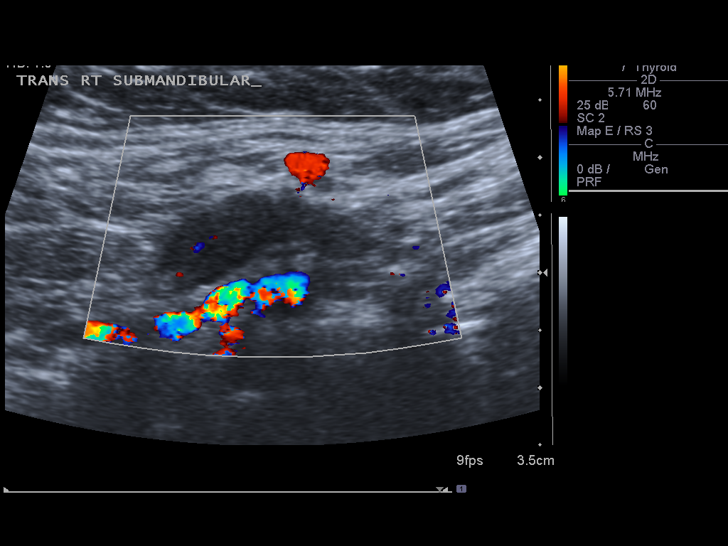
[im 19/26]
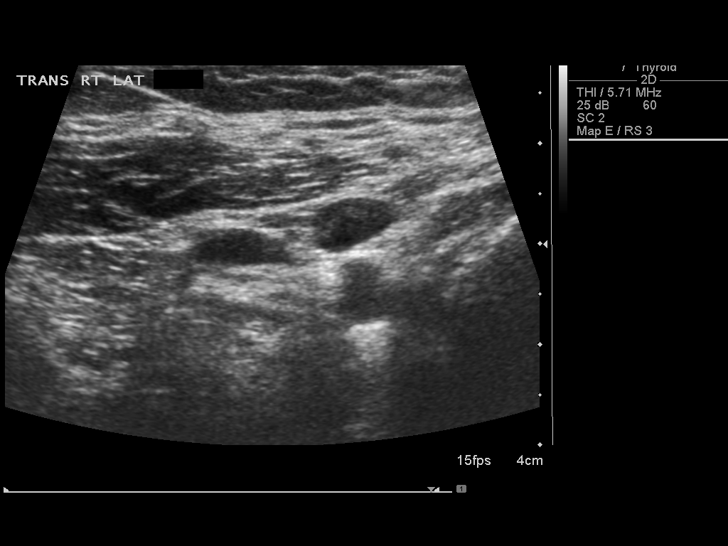
[im 21/26]
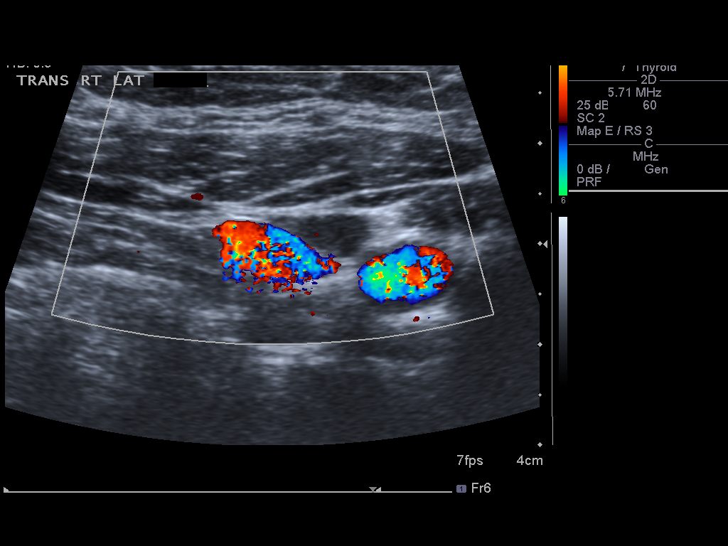
[im 23/26]
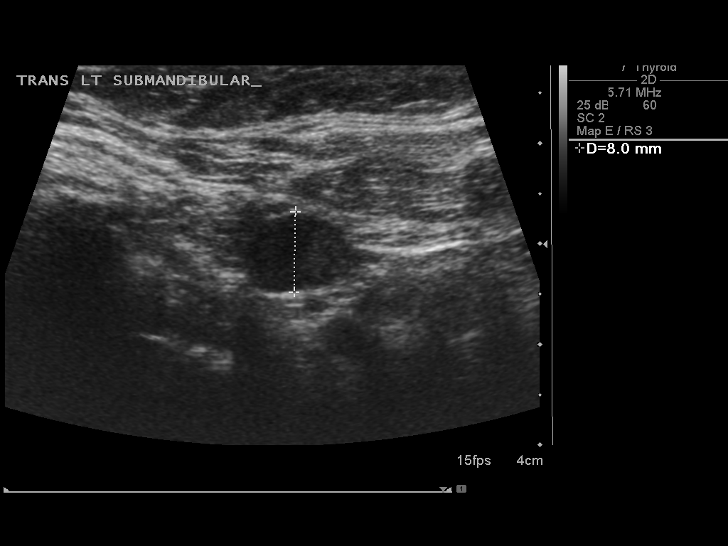
[im 26/26]
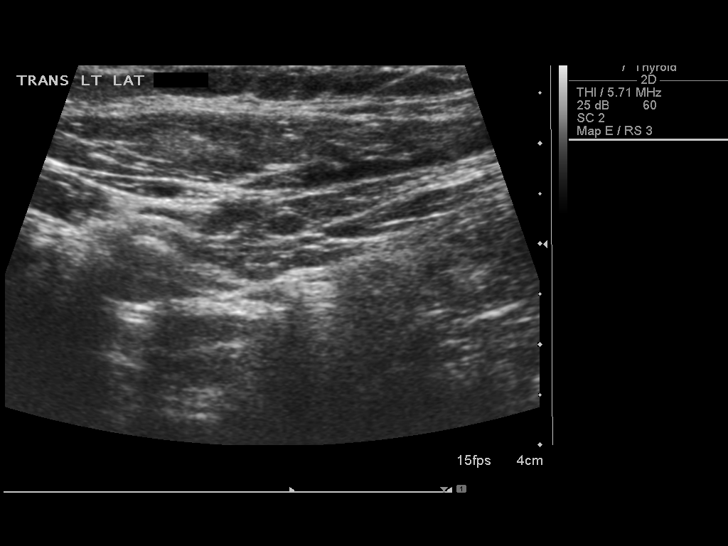

[14 of 25 positions shown; findings below may reference images not displayed]

FINDINGS: Right thyroid lobe

Surgically absent.  No nodules visualized.

Left thyroid lobe

Surgically absent.  No nodules visualized.

Isthmus

Surgically absent. No nodules visualized.

Lymphadenopathy

None visualized. Bilateral submandibular nodes measure 8 mm short
axis diameter .
IMPRESSION: 1. No residual/recurrent tissue  post thyroidectomy.

## 2017-04-12 ENCOUNTER — Telehealth: Payer: Self-pay | Admitting: Family Medicine

## 2017-04-12 NOTE — Telephone Encounter (Signed)
Attempted to contact pt regarding refill request; per Dr Steffanie Dunn Smith's note dated 10/11/16 "Return in about 3 months (around 01/11/2017) for complete physical examiniation."; no other past or upcoming appointments noted; left message on voicemail [phone] for pt to schedule appointment.

## 2017-04-12 NOTE — Telephone Encounter (Signed)
Copied from [place]. Topic: Quick Communication - Rx Refill/Question >> Apr 12, 2017  1:11 PM Synthia Innocent wrote: Medication:  buPROPion (WELLBUTRIN XL) 150 MG 24 hr tablet 90 day supply  Has the patient contacted their pharmacy? Yes.     (Agent: If no, request that the patient contact the pharmacy for the refill.)   Preferred Pharmacy (with phone number or street name): [facility] [address]   Agent: Please be advised that RX refills may take up to 3 business days. We ask that you follow-up with your pharmacy.

## 2017-04-13 ENCOUNTER — Other Ambulatory Visit: Payer: Self-pay | Admitting: Family Medicine

## 2017-04-16 ENCOUNTER — Other Ambulatory Visit: Payer: Self-pay | Admitting: Family Medicine

## 2017-04-16 DIAGNOSIS — G4726 Circadian rhythm sleep disorder, shift work type: Secondary | ICD-10-CM

## 2017-04-17 NOTE — Telephone Encounter (Signed)
Requesting Provigil and Levothyoxine refill Last OV: 10/11/16 for both meds Last Refill:Provigil: 10/11/16  And Last Refill for levothyoxine: 08/25/16  [id number] [place]  States needs OV for refill on Levothyroxine

## 2017-05-19 ENCOUNTER — Other Ambulatory Visit: Payer: Self-pay | Admitting: Physician Assistant

## 2017-05-20 ENCOUNTER — Other Ambulatory Visit: Payer: Self-pay | Admitting: Family Medicine

## 2017-06-06 ENCOUNTER — Encounter: Payer: BC Managed Care – PPO | Admitting: Family Medicine

## 2017-07-07 ENCOUNTER — Ambulatory Visit (INDEPENDENT_AMBULATORY_CARE_PROVIDER_SITE_OTHER): Payer: BC Managed Care – PPO | Admitting: Family Medicine

## 2017-07-07 ENCOUNTER — Encounter: Payer: Self-pay | Admitting: Family Medicine

## 2017-07-07 VITALS — BP 122/78 | HR 98 | Temp 98.0°F | Resp 16 | Ht 68.0 in | Wt 241.2 lb

## 2017-07-07 DIAGNOSIS — R3129 Other microscopic hematuria: Secondary | ICD-10-CM | POA: Diagnosis not present

## 2017-07-07 DIAGNOSIS — Z0000 Encounter for general adult medical examination without abnormal findings: Secondary | ICD-10-CM | POA: Diagnosis not present

## 2017-07-07 DIAGNOSIS — C73 Malignant neoplasm of thyroid gland: Secondary | ICD-10-CM

## 2017-07-07 DIAGNOSIS — R87810 Cervical high risk human papillomavirus (HPV) DNA test positive: Secondary | ICD-10-CM

## 2017-07-07 DIAGNOSIS — R7302 Impaired glucose tolerance (oral): Secondary | ICD-10-CM | POA: Diagnosis not present

## 2017-07-07 DIAGNOSIS — Z716 Tobacco abuse counseling: Secondary | ICD-10-CM

## 2017-07-07 DIAGNOSIS — Z136 Encounter for screening for cardiovascular disorders: Secondary | ICD-10-CM

## 2017-07-07 DIAGNOSIS — Z6836 Body mass index (BMI) 36.0-36.9, adult: Secondary | ICD-10-CM

## 2017-07-07 DIAGNOSIS — E890 Postprocedural hypothyroidism: Secondary | ICD-10-CM | POA: Diagnosis not present

## 2017-07-07 DIAGNOSIS — Z114 Encounter for screening for human immunodeficiency virus [HIV]: Secondary | ICD-10-CM

## 2017-07-07 DIAGNOSIS — Z111 Encounter for screening for respiratory tuberculosis: Secondary | ICD-10-CM

## 2017-07-07 DIAGNOSIS — E7800 Pure hypercholesterolemia, unspecified: Secondary | ICD-10-CM

## 2017-07-07 DIAGNOSIS — F418 Other specified anxiety disorders: Secondary | ICD-10-CM

## 2017-07-07 DIAGNOSIS — J309 Allergic rhinitis, unspecified: Secondary | ICD-10-CM | POA: Diagnosis not present

## 2017-07-07 LAB — POC MICROSCOPIC URINALYSIS (UMFC): MUCUS RE: ABSENT

## 2017-07-07 LAB — POCT URINALYSIS DIP (MANUAL ENTRY)
BILIRUBIN UA: NEGATIVE
BILIRUBIN UA: NEGATIVE mg/dL
GLUCOSE UA: NEGATIVE mg/dL
NITRITE UA: NEGATIVE
PH UA: 6.5 (ref 5.0–8.0)
Protein Ur, POC: NEGATIVE mg/dL
Spec Grav, UA: 1.015 (ref 1.010–1.025)
Urobilinogen, UA: 0.2 E.U./dL

## 2017-07-07 MED ORDER — LEVOTHYROXINE SODIUM 150 MCG PO TABS: 150.0000 ug | ORAL_TABLET | Freq: Every day | ORAL | 3 refills | Status: DC

## 2017-07-07 NOTE — Patient Instructions (Addendum)
We recommend that you schedule a mammogram for breast cancer screening. Typically, you do not need a referral to do this. Please contact a local imaging center to schedule your mammogram.  [facility] - [phone]  *ask for the Radiology Department [facility]) - [phone] or [phone]  [facility] - [phone] [facility] - [phone] [facility] - [phone]  *ask for the Radiology Department [facility] - [phone]  *ask for the Radiology Department [facility] - [phone]  *ask for the Mammography Department [facility] - [phone]   IF you received an x-ray today, you will receive an invoice from [facility]. Please contact [facility] at [phone] with questions or concerns regarding your invoice.   IF you received labwork today, you will receive an invoice from [facility]. Please contact [facility] at [phone] with questions or concerns regarding your invoice.   Our billing staff will not be able to assist you with questions regarding bills from these companies.  You will be contacted with the lab results as soon as they are available. The fastest way to get your results is to activate your My Chart account. Instructions are located on the last page of this paperwork. If you have not heard from us regarding the results in 2 weeks, please contact this office.     Preventive Care 40-64 Years, Female Preventive care refers to lifestyle choices and visits with your health care provider that can promote health and wellness. What does preventive care include?  A yearly physical exam. This is also called an annual well check.  Dental exams once or twice a year.  Routine eye exams. Ask your health care provider how often you should have your eyes checked.  Personal lifestyle choices, including: ? Daily care of your teeth and  gums. ? Regular physical activity. ? Eating a healthy diet. ? Avoiding tobacco and drug use. ? Limiting alcohol use. ? Practicing safe sex. ? Taking low-dose aspirin daily starting at age 50. ? Taking vitamin and mineral supplements as recommended by your health care provider. What happens during an annual well check? The services and screenings done by your health care provider during your annual well check will depend on your age, overall health, lifestyle risk factors, and family history of disease. Counseling Your health care provider may ask you questions about your:  Alcohol use.  Tobacco use.  Drug use.  Emotional well-being.  Home and relationship well-being.  Sexual activity.  Eating habits.  Work and work environment.  Method of birth control.  Menstrual cycle.  Pregnancy history.  Screening You may have the following tests or measurements:  Height, weight, and BMI.  Blood pressure.  Lipid and cholesterol levels. These may be checked every 5 years, or more frequently if you are over 50 years old.  Skin check.  Lung cancer screening. You may have this screening every year starting at age 55 if you have a 30-pack-year history of smoking and currently smoke or have quit within the past 15 years.  Fecal occult blood test (FOBT) of the stool. You may have this test every year starting at age 50.  Flexible sigmoidoscopy or colonoscopy. You may have a sigmoidoscopy every 5 years or a colonoscopy every 10 years starting at age 50.  Hepatitis C blood test.  Hepatitis B blood test.  Sexually transmitted disease (STD) testing.  Diabetes screening. This is done by checking your   blood sugar (glucose) after you have not eaten for a while (fasting). You may have this done every 1-3 years.  Mammogram. This may be done every 1-2 years. Talk to your health care provider about when you should start having regular mammograms. This may depend on whether you have a  family history of breast cancer.  BRCA-related cancer screening. This may be done if you have a family history of breast, ovarian, tubal, or peritoneal cancers.  Pelvic exam and Pap test. This may be done every 3 years starting at age 21. Starting at age 30, this may be done every 5 years if you have a Pap test in combination with an HPV test.  Bone density scan. This is done to screen for osteoporosis. You may have this scan if you are at high risk for osteoporosis.  Discuss your test results, treatment options, and if necessary, the need for more tests with your health care provider. Vaccines Your health care provider may recommend certain vaccines, such as:  Influenza vaccine. This is recommended every year.  Tetanus, diphtheria, and acellular pertussis (Tdap, Td) vaccine. You may need a Td booster every 10 years.  Varicella vaccine. You may need this if you have not been vaccinated.  Zoster vaccine. You may need this after age 60.  Measles, mumps, and rubella (MMR) vaccine. You may need at least one dose of MMR if you were born in 1957 or later. You may also need a second dose.  Pneumococcal 13-valent conjugate (PCV13) vaccine. You may need this if you have certain conditions and were not previously vaccinated.  Pneumococcal polysaccharide (PPSV23) vaccine. You may need one or two doses if you smoke cigarettes or if you have certain conditions.  Meningococcal vaccine. You may need this if you have certain conditions.  Hepatitis A vaccine. You may need this if you have certain conditions or if you travel or work in places where you may be exposed to hepatitis A.  Hepatitis B vaccine. You may need this if you have certain conditions or if you travel or work in places where you may be exposed to hepatitis B.  Haemophilus influenzae type b (Hib) vaccine. You may need this if you have certain conditions.  Talk to your health care provider about which screenings and vaccines you need  and how often you need them. This information is not intended to replace advice given to you by your health care provider. Make sure you discuss any questions you have with your health care provider. Document Released: 04/02/2015 Document Revised: 11/24/2015 Document Reviewed: 01/05/2015 Elsevier Interactive Patient Education  2018 Elsevier Inc.  

## 2017-07-07 NOTE — Progress Notes (Signed)
Subjective:    Patient ID: Taylor Powers, female    DOB: [birth year], 47 y.o.   MRN: [id number]  07/07/2017  Annual Exam    HPI This 47 y.o. female presents for complete physical examination.  Last physical:  04-2015 Pap smear:  04-2015; never went to Dr. Charlesetta Garibaldi. LMP age 29. Mammogram:  06-2013 Eye exam:  [facility]; last summer 2018; rx. Dental exam:  2018; every six months.  S/p psychiatric consultation; diagnosed with ADD.  Started on Vyvanse; not taking daily.  Tried to wean of sertraline.      Visual Acuity Screening   Right eye Left eye Both eyes  Without correction: 20/20-1 20/20-1 20/20  With correction:       BP Readings from Last 3 Encounters:  07/07/17 122/78  10/11/16 113/77  08/07/16 108/76   Wt Readings from Last 3 Encounters:  07/07/17 241 lb 3.2 oz (109.4 kg)  10/11/16 242 lb (109.8 kg)  08/07/16 238 lb 3.2 oz (108 kg)   Immunization History  Administered Date(s) Administered  . Influenza Split 12/18/2012  . Influenza,inj,Quad PF,6+ Mos 11/24/2015  . Influenza-Unspecified 12/18/2013, 01/14/2015, 12/18/2016  . PPD Test 12/21/2011, 06/08/2015, 06/19/2015, 08/07/2016  . Pneumococcal Polysaccharide-23 05/04/2015  . Td 03/21/2003, 03/20/2010  . Tdap 03/20/2010   Health Maintenance  Topic Date Due  . INFLUENZA VACCINE  10/18/2017  . PAP SMEAR  05/03/2018  . MAMMOGRAM  08/03/2018  . TETANUS/TDAP  03/20/2020  . HIV Screening  Completed   Review of Systems  Constitutional: Negative for activity change, appetite change, chills, diaphoresis, fatigue, fever and unexpected weight change.  HENT: Negative for congestion, dental problem, drooling, ear discharge, ear pain, facial swelling, hearing loss, mouth sores, nosebleeds, postnasal drip, rhinorrhea, sinus pressure, sneezing, sore throat, tinnitus, trouble swallowing and voice change.   Eyes: Negative for photophobia, pain, discharge, redness, itching and visual disturbance.  Respiratory: Negative for apnea,  cough, choking, chest tightness, shortness of breath, wheezing and stridor.   Cardiovascular: Negative for chest pain, palpitations and leg swelling.  Gastrointestinal: Negative for abdominal distention, abdominal pain, anal bleeding, blood in stool, constipation, diarrhea, nausea, rectal pain and vomiting.  Endocrine: Negative for cold intolerance, heat intolerance, polydipsia, polyphagia and polyuria.  Genitourinary: Negative for decreased urine volume, difficulty urinating, dyspareunia, dysuria, enuresis, flank pain, frequency, genital sores, hematuria, menstrual problem, pelvic pain, urgency, vaginal bleeding, vaginal discharge and vaginal pain.       Nocturia x 0; incontinence stress.  Musculoskeletal: Positive for back pain, myalgias and neck stiffness. Negative for arthralgias, gait problem, joint swelling and neck pain.  Skin: Negative for color change, pallor, rash and wound.  Allergic/Immunologic: Positive for environmental allergies. Negative for food allergies and immunocompromised state.  Neurological: Negative for dizziness, tremors, seizures, syncope, facial asymmetry, speech difficulty, weakness, light-headedness, numbness and headaches.  Hematological: Negative for adenopathy. Does not bruise/bleed easily.  Psychiatric/Behavioral: Positive for decreased concentration and dysphoric mood. Negative for agitation, behavioral problems, confusion, hallucinations, self-injury, sleep disturbance and suicidal ideas. The patient is not nervous/anxious and is not hyperactive.     Past Medical History:  Diagnosis Date  . Absence of menstruation   . Allergic rhinitis, cause unspecified   . Anxiety state, unspecified   . Arthritis    DDD L4-5; ruptured disc; laminectomy.  . Cancer ([place]) 09/18/2010   thyroid cancer; s/p thyroidectomy total; s/p ablation.  [place].  . Chicken pox   . Diabetes mellitus   . Dysplasia of cervix, unspecified   . Dysthymic disorder   .  Hematuria, unspecified     . Hypersomnia, unspecified    night shift work  . Insomnia, unspecified   . Mixed hyperlipidemia   . Other abnormal glucose   . Other acne   . Thyroid disease   . Tobacco use disorder   . Unspecified vitamin D deficiency    Past Surgical History:  Procedure Laterality Date  . history of LEEP  2005  . LAMINECTOMY  1996   L4-L5  . lipoma of shoulder  2005   Right . removed  . SPINE SURGERY  03/20/1994   L4-L5 laminectomy  . TOTAL THYROIDECTOMY  2012  . TUBAL LIGATION  1998   Allergies  Allergen Reactions  . Sulfa Antibiotics Hives    All over body   Current Outpatient Medications on File Prior to Visit  Medication Sig Dispense Refill  . lisdexamfetamine (VYVANSE) 50 MG capsule     . metaxalone (SKELAXIN) 800 MG tablet Take 1 tablet (800 mg total) by mouth 3 (three) times daily as needed for muscle spasms. 60 tablet 2  . sertraline (ZOLOFT) 100 MG tablet TAKE 1 TABLET BY MOUTH EVERY DAY 90 tablet 1   No current facility-administered medications on file prior to visit.    Social History   Socioeconomic History  . Marital status: Married    Spouse name: Dawayne Cirri  . Number of children: 3  . Years of education: Not on file  . Highest education level: Not on file  Occupational History  . Occupation: Psych ED    Employer: [employer]  . Occupation: Programmer, multimedia: [employer]  . Financial resource strain: Not on file  . Food insecurity:    Worry: Not on file    Inability: Not on file  . Transportation needs:    Medical: Not on file    Non-medical: Not on file  Tobacco Use  . Smoking status: Current Every Day Smoker    Packs/day: 0.50    Years: 27.00    Pack years: 13.50    Types: Cigarettes  . Smokeless tobacco: Never Used  Substance and Sexual Activity  . Alcohol use: No    Alcohol/week: 0.0 oz  . Drug use: No  . Sexual activity: Yes    Comment: same sexual partner  Lifestyle  . Physical activity:    Days per week: Not on file     Minutes per session: Not on file  . Stress: Not on file  Relationships  . Social connections:    Talks on phone: Not on file    Gets together: Not on file    Attends religious service: Not on file    Active member of club or organization: Not on file    Attends meetings of clubs or organizations: Not on file    Relationship status: Not on file  . Intimate partner violence:    Fear of current or ex partner: Not on file    Emotionally abused: Not on file    Physically abused: Not on file    Forced sexual activity: Not on file  Other Topics Concern  . Not on file  Social History Narrative   Marital status:  Married same sex partner 12/2013 Dawayne Cirri. Happy, no abuse.      Children:  3 children (24, 22, 20); 3 grandchildren.        Lives: with wife.      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  Two sixteen hour shifts followed by 8 hour shift; weekends.  Off Mon-Thurs.  [place] job.  Graduate school in 2019.      Tobacco: 1 ppd x 30 years.        Alcohol: none; partner/wife in recovery.      Drugs: none      Exercise: none      Caffeine use: diet Pepsi and coffee.      Always uses seat belts.       Smoke alarm and carbon monoxide detector in the home.       No guns.    Family History  Problem Relation Age of Onset  . Depression Mother   . Kidney disease Mother        R Nephrectomy kidney stones  . Irritable bowel syndrome Mother   . Diabetes Father   . Depression Father   . Cancer Maternal Grandfather   . Bipolar disorder Son   . ADD / ADHD Son   . Hypertension Paternal Grandmother   . ADD / ADHD Brother   . ADD / ADHD Brother        Objective:    BP 122/78 (BP Location: Right Arm, Patient Position: Sitting, Cuff Size: Large)   Pulse 98   Temp 98 F (36.7 C) (Oral)   Resp 16   Ht 5\' 8"  (1.727 m)   Wt 241 lb 3.2 oz (109.4 kg)   LMP 03/05/2013   SpO2 98%   BMI 36.67 kg/m  Physical Exam  Constitutional: She is  oriented to person, place, and time. She appears well-developed and well-nourished. No distress.  HENT:  Head: Normocephalic and atraumatic.  Right Ear: External ear normal.  Left Ear: External ear normal.  Nose: Nose normal.  Mouth/Throat: Oropharynx is clear and moist.  Eyes: Pupils are equal, round, and reactive to light. Conjunctivae and EOM are normal.  Neck: Normal range of motion and full passive range of motion without pain. Neck supple. No JVD present. Carotid bruit is not present. No thyromegaly present.  Cardiovascular: Normal rate, regular rhythm and normal heart sounds. Exam reveals no gallop and no friction rub.  No murmur heard. Pulmonary/Chest: Effort normal and breath sounds normal. She has no wheezes. She has no rales.  Abdominal: Soft. Bowel sounds are normal. She exhibits no distension and no mass. There is no tenderness. There is no rebound and no guarding.  Musculoskeletal:       Right shoulder: Normal.       Left shoulder: Normal.       Cervical back: Normal.  Lymphadenopathy:    She has no cervical adenopathy.  Neurological: She is alert and oriented to person, place, and time. She has normal reflexes. No cranial nerve deficit. She exhibits normal muscle tone. Coordination normal.  Skin: Skin is warm and dry. No rash noted. She is not diaphoretic. No erythema. No pallor.  Psychiatric: She has a normal mood and affect. Her behavior is normal. Judgment and thought content normal.  Nursing note and vitals reviewed.  No results found. Depression screen [facility] 2/9 07/07/2017 10/11/2016 08/07/2016 11/24/2015 06/08/2015  Decreased Interest 0 0 0 0 0  Down, Depressed, Hopeless 0 0 0 0 0  PHQ - 2 Score 0 0 0 0 0   Fall Risk  10/11/2016 08/07/2016 11/24/2015 05/04/2015 06/17/2014  Falls in the past year? No No No No No        Assessment & Plan:   1. Routine physical examination   2. Glucose  intolerance (impaired glucose tolerance)   3. Postsurgical hypothyroidism   4. Cervical  high risk HPV (human papillomavirus) test positive   5. Depression with anxiety   6. Pure hypercholesterolemia   7. Allergic rhinitis, unspecified seasonality, unspecified trigger   8. Screening for HIV (human immunodeficiency virus)   9. Screening-pulmonary TB   10. Microscopic hematuria   11. Screening for cardiovascular condition   12. Cancer of thyroid T3N0 S/P thyroidectomy 10/07/10   13. Class 2 severe obesity due to excess calories with serious comorbidity and body mass index (BMI) of 36.0 to 36.9 in adult ([place])   14. Tobacco abuse counseling     -anticipatory guidance provided --- exercise, weight loss, safe driving practices, aspirin 81mg  daily. -obtain age appropriate screening labs and labs for chronic disease management. Recommend weight loss, exercise for 30-60 minutes five days per week; recommend 1200 kcal restriction per day with a minimum of 60 grams of protein per day.  Eat 3 meals per day. Do not skip meals. Consider having a protein shake as a meal replacement to aid with eliminating meal skipping. Look for products with <220 calories, <7 gm sugar, and 20-30 gm protein.  Eat breakfast within 2 hours of getting up.   Make  your plate non-starchy vegetables,  protein, and  carbohydrates at lunch and dinner.   Aim for at least 64 oz. of calorie-free beverages daily (water, Crystal Light, diet green tea, etc.). Eliminate any sugary beverages such as regular soda, sweet tea, or fruit juice.   Pay attention to hunger and fullness cues.  Stop eating once you feel satisfied; don't wait until you feel full, stuffed, or sick from eating.  Choose lean meats and low fat/fat free dairy products.  Choose foods high in fiber such as fruits, vegetables, and whole grains (brown rice, whole wheat pasta, whole wheat bread, etc.).  Limit foods with added sugar to <7 gm per serving.  Always eat in the kitchen/dining room.  Never eat in the bedroom or in front of the TV. -Warrants  Tb screening for work. -Smoking cessation instruction/counseling given:  counseled patient on the dangers of tobacco use, advised patient to stop smoking, and reviewed strategies to maximize success.   Orders Placed This Encounter  Procedures  . CBC with Differential/Platelet  . Comprehensive metabolic panel    Order Specific Question:   Has the patient fasted?    Answer:   No  . Hemoglobin A1c  . Lipid panel    Order Specific Question:   Has the patient fasted?    Answer:   No  . TSH  . T4, Free  . HIV antibody  . QuantiFERON-TB Gold Plus  . POCT urinalysis dipstick  . POCT Microscopic Urinalysis ([facility])  . EKG 12-Lead   Meds ordered this encounter  Medications  . levothyroxine (SYNTHROID, LEVOTHROID) 150 MCG tablet    Sig: Take 1 tablet (150 mcg total) by mouth daily before breakfast.    Dispense:  90 tablet    Refill:  3    Return in about 3 months (around 10/06/2017) for recheck.   Arilla Hice Elayne Guerin, M.D. Primary Care at [facility] [address] [place],   [phone] phone [phone] fax

## 2017-07-08 LAB — HIV ANTIBODY (ROUTINE TESTING W REFLEX): HIV Screen 4th Generation wRfx: NONREACTIVE

## 2017-07-09 LAB — CBC WITH DIFFERENTIAL/PLATELET
BASOS ABS: 0 10*3/uL (ref 0.0–0.2)
Basos: 0 %
EOS (ABSOLUTE): 0.1 10*3/uL (ref 0.0–0.4)
Eos: 2 %
HEMOGLOBIN: 12.5 g/dL (ref 11.1–15.9)
Hematocrit: 37.6 % (ref 34.0–46.6)
IMMATURE GRANS (ABS): 0 10*3/uL (ref 0.0–0.1)
Immature Granulocytes: 0 %
LYMPHS: 42 %
Lymphocytes Absolute: 2.2 10*3/uL (ref 0.7–3.1)
MCH: 29.1 pg (ref 26.6–33.0)
MCHC: 33.2 g/dL (ref 31.5–35.7)
MCV: 88 fL (ref 79–97)
MONOCYTES: 5 %
Monocytes Absolute: 0.3 10*3/uL (ref 0.1–0.9)
Neutrophils Absolute: 2.7 10*3/uL (ref 1.4–7.0)
Neutrophils: 51 %
Platelets: 177 10*3/uL (ref 150–379)
RBC: 4.29 x10E6/uL (ref 3.77–5.28)
RDW: 13.4 % (ref 12.3–15.4)
WBC: 5.3 10*3/uL (ref 3.4–10.8)

## 2017-07-09 LAB — COMPREHENSIVE METABOLIC PANEL
ALBUMIN: 4.6 g/dL (ref 3.5–5.5)
ALK PHOS: 96 IU/L (ref 39–117)
ALT: 39 IU/L — ABNORMAL HIGH (ref 0–32)
AST: 23 IU/L (ref 0–40)
Albumin/Globulin Ratio: 1.8 (ref 1.2–2.2)
BUN / CREAT RATIO: 21 (ref 9–23)
BUN: 20 mg/dL (ref 6–24)
Bilirubin Total: 0.2 mg/dL (ref 0.0–1.2)
CO2: 23 mmol/L (ref 20–29)
CREATININE: 0.94 mg/dL (ref 0.57–1.00)
Calcium: 9.3 mg/dL (ref 8.7–10.2)
Chloride: 101 mmol/L (ref 96–106)
GFR calc non Af Amer: 72 mL/min/{1.73_m2} (ref >59)
GFR, EST AFRICAN AMERICAN: 84 mL/min/{1.73_m2} (ref >59)
GLUCOSE: 93 mg/dL (ref 65–99)
Globulin, Total: 2.5 g/dL (ref 1.5–4.5)
Potassium: 4.3 mmol/L (ref 3.5–5.2)
Sodium: 138 mmol/L (ref 134–144)
TOTAL PROTEIN: 7.1 g/dL (ref 6.0–8.5)

## 2017-07-09 LAB — HEMOGLOBIN A1C
Est. average glucose Bld gHb Est-mCnc: 111 mg/dL
HEMOGLOBIN A1C: 5.5 % (ref 4.8–5.6)

## 2017-07-09 LAB — TSH: TSH: 1.27 u[IU]/mL (ref 0.450–4.500)

## 2017-07-09 LAB — LIPID PANEL
CHOL/HDL RATIO: 6.9 ratio — AB (ref 0.0–4.4)
Cholesterol, Total: 193 mg/dL (ref 100–199)
HDL: 28 mg/dL — AB (ref >39)
LDL Calculated: 102 mg/dL — ABNORMAL HIGH (ref 0–99)
TRIGLYCERIDES: 316 mg/dL — AB (ref 0–149)
VLDL Cholesterol Cal: 63 mg/dL — ABNORMAL HIGH (ref 5–40)

## 2017-07-09 LAB — T4, FREE: Free T4: 1.3 ng/dL (ref 0.82–1.77)

## 2017-07-12 LAB — QUANTIFERON-TB GOLD PLUS
QUANTIFERON TB2 AG VALUE: 0.03 [IU]/mL
QUANTIFERON-TB GOLD PLUS: NEGATIVE
QuantiFERON Nil Value: 0.04 IU/mL
QuantiFERON TB1 Ag Value: 0.03 IU/mL

## 2017-07-13 ENCOUNTER — Other Ambulatory Visit: Payer: Self-pay | Admitting: Family Medicine

## 2017-07-13 ENCOUNTER — Encounter: Payer: Self-pay | Admitting: Family Medicine

## 2017-07-13 DIAGNOSIS — Z1231 Encounter for screening mammogram for malignant neoplasm of breast: Secondary | ICD-10-CM

## 2017-07-13 DIAGNOSIS — R3129 Other microscopic hematuria: Secondary | ICD-10-CM

## 2017-08-02 ENCOUNTER — Ambulatory Visit
Admission: RE | Admit: 2017-08-02 | Discharge: 2017-08-02 | Disposition: A | Discharge status: Home / Self Care | Payer: BC Managed Care – PPO | Source: Ambulatory Visit | Attending: Family Medicine | Admitting: Family Medicine

## 2017-08-02 DIAGNOSIS — Z1231 Encounter for screening mammogram for malignant neoplasm of breast: Secondary | ICD-10-CM

## 2017-08-09 ENCOUNTER — Encounter: Payer: Self-pay | Admitting: Family Medicine

## 2017-09-01 ENCOUNTER — Other Ambulatory Visit: Payer: Self-pay | Admitting: Family Medicine

## 2017-09-03 NOTE — Telephone Encounter (Signed)
[facility] called and spoke to the pharmacist,  about the error message received from [place] submission sent today, she says a rx request was received on 09/01/17. No need to resubmit at this time.

## 2017-09-11 ENCOUNTER — Ambulatory Visit: Payer: Self-pay | Admitting: Urology

## 2017-09-25 ENCOUNTER — Telehealth: Payer: Self-pay | Admitting: Family Medicine

## 2017-09-25 NOTE — Telephone Encounter (Signed)
Pharmacy requesting 90 day supply on Wellbutrin.

## 2017-10-10 ENCOUNTER — Encounter: Payer: Self-pay | Admitting: Family Medicine

## 2017-10-10 ENCOUNTER — Ambulatory Visit: Payer: Managed Care, Other (non HMO) | Admitting: Family Medicine

## 2017-10-10 ENCOUNTER — Other Ambulatory Visit: Payer: Self-pay

## 2017-10-10 VITALS — BP 118/78 | HR 112 | Temp 98.8°F | Resp 16 | Ht 68.5 in | Wt 232.0 lb

## 2017-10-10 DIAGNOSIS — R3129 Other microscopic hematuria: Secondary | ICD-10-CM

## 2017-10-10 DIAGNOSIS — F902 Attention-deficit hyperactivity disorder, combined type: Secondary | ICD-10-CM

## 2017-10-10 DIAGNOSIS — R7302 Impaired glucose tolerance (oral): Secondary | ICD-10-CM

## 2017-10-10 DIAGNOSIS — F418 Other specified anxiety disorders: Secondary | ICD-10-CM

## 2017-10-10 DIAGNOSIS — R87810 Cervical high risk human papillomavirus (HPV) DNA test positive: Secondary | ICD-10-CM

## 2017-10-10 DIAGNOSIS — E7800 Pure hypercholesterolemia, unspecified: Secondary | ICD-10-CM | POA: Diagnosis not present

## 2017-10-10 DIAGNOSIS — C73 Malignant neoplasm of thyroid gland: Secondary | ICD-10-CM

## 2017-10-10 DIAGNOSIS — R945 Abnormal results of liver function studies: Secondary | ICD-10-CM

## 2017-10-10 DIAGNOSIS — J301 Allergic rhinitis due to pollen: Secondary | ICD-10-CM

## 2017-10-10 DIAGNOSIS — R7989 Other specified abnormal findings of blood chemistry: Secondary | ICD-10-CM

## 2017-10-10 LAB — COMPREHENSIVE METABOLIC PANEL
A/G RATIO: 1.7 (ref 1.2–2.2)
ALBUMIN: 4.5 g/dL (ref 3.5–5.5)
ALT: 55 IU/L — ABNORMAL HIGH (ref 0–32)
AST: 25 IU/L (ref 0–40)
Alkaline Phosphatase: 108 IU/L (ref 39–117)
BILIRUBIN TOTAL: 0.2 mg/dL (ref 0.0–1.2)
BUN / CREAT RATIO: 14 (ref 9–23)
BUN: 12 mg/dL (ref 6–24)
CHLORIDE: 105 mmol/L (ref 96–106)
CO2: 21 mmol/L (ref 20–29)
Calcium: 9.3 mg/dL (ref 8.7–10.2)
Creatinine, Ser: 0.85 mg/dL (ref 0.57–1.00)
GFR calc non Af Amer: 82 mL/min/{1.73_m2} (ref >59)
GFR, EST AFRICAN AMERICAN: 94 mL/min/{1.73_m2} (ref >59)
GLOBULIN, TOTAL: 2.7 g/dL (ref 1.5–4.5)
Glucose: 114 mg/dL — ABNORMAL HIGH (ref 65–99)
Potassium: 4.3 mmol/L (ref 3.5–5.2)
Sodium: 140 mmol/L (ref 134–144)
TOTAL PROTEIN: 7.2 g/dL (ref 6.0–8.5)

## 2017-10-10 LAB — POCT URINALYSIS DIP (MANUAL ENTRY)
Glucose, UA: NEGATIVE mg/dL
Nitrite, UA: NEGATIVE
PROTEIN UA: NEGATIVE mg/dL
SPEC GRAV UA: 1.025 (ref 1.010–1.025)
UROBILINOGEN UA: 1 U/dL
pH, UA: 5.5 (ref 5.0–8.0)

## 2017-10-10 LAB — LIPID PANEL
Chol/HDL Ratio: 7.1 ratio — ABNORMAL HIGH (ref 0.0–4.4)
Cholesterol, Total: 198 mg/dL (ref 100–199)
HDL: 28 mg/dL — ABNORMAL LOW (ref >39)
LDL Calculated: 109 mg/dL — ABNORMAL HIGH (ref 0–99)
Triglycerides: 303 mg/dL — ABNORMAL HIGH (ref 0–149)
VLDL CHOLESTEROL CAL: 61 mg/dL — AB (ref 5–40)

## 2017-10-10 NOTE — Patient Instructions (Addendum)
  Call UROLOGY. Call DR. DILLARD.   Providers at [facility] [place]: Grant Fontana, MD Janeann Forehand, MD Delman Cheadle, MD    IF you received an x-ray today, you will receive an invoice from [facility]. Please contact [facility] at [phone] with questions or concerns regarding your invoice.   IF you received labwork today, you will receive an invoice from [place]. Please contact [facility] at [phone] with questions or concerns regarding your invoice.   Our billing staff will not be able to assist you with questions regarding bills from these companies.  You will be contacted with the lab results as soon as they are available. The fastest way to get your results is to activate your My Chart account. Instructions are located on the last page of this paperwork. If you have not heard from [place] regarding the results in 2 weeks, please contact this office.

## 2017-10-10 NOTE — Progress Notes (Signed)
Subjective:    Patient ID: Taylor Powers, female    DOB: 12-06-1970, 47 y.o.   MRN: [id number]  10/10/2017  Depression (3 month follow-up ) and Anxiety    HPI This 47 y.o. female presents for three month follow-up of dyslipidemia, hypothyroidism, microscopic hematuria, obesity, tobacco abuse.  Quit smoking three weeks ago. Quit eating fried foods; walking. Quit job; only doing school and home; parents offered to loan money. Parents supportive; mother psych nurse. Already has job; [facility].   Will graduate 03/02/18.  One more semester.   Program manager wants to get DNP; doctor nursing practice.   Weight down 9 pounds.   [facility] therapy; decreased depression.  Walking one mile two times per week; goal of four miles per week.  Did make appointment with urology; out sick; stated had called; has not rescheduled yet.  Now that no longer working, will call.   Called Dr. Charlesetta Garibaldi and has not returned call. Non-fasting; zuccini no sugar plant based butter. Quit drinking diet soda.   Drinks flavored water; stevia and minerals.  [place].     BP Readings from Last 3 Encounters:  10/10/17 118/78  07/07/17 122/78  10/11/16 113/77   Wt Readings from Last 3 Encounters:  10/10/17 232 lb (105.2 kg)  07/07/17 241 lb 3.2 oz (109.4 kg)  10/11/16 242 lb (109.8 kg)   Immunization History  Administered Date(s) Administered  . Influenza Split 12/18/2012  . Influenza,inj,Quad PF,6+ Mos 11/24/2015  . Influenza-Unspecified 12/18/2013, 01/14/2015, 12/18/2016  . PPD Test 12/21/2011, 06/08/2015, 06/19/2015, 08/07/2016  . Pneumococcal Polysaccharide-23 05/04/2015  . Td 03/21/2003, 03/20/2010  . Tdap 03/20/2010    Review of Systems  Constitutional: Negative for activity change, appetite change, chills, diaphoresis, fatigue, fever and unexpected weight change.  HENT: Negative for congestion, dental problem, drooling, ear discharge, ear pain, facial swelling, hearing loss, mouth  sores, nosebleeds, postnasal drip, rhinorrhea, sinus pressure, sneezing, sore throat, tinnitus, trouble swallowing and voice change.   Eyes: Negative for photophobia, pain, discharge, redness, itching and visual disturbance.  Respiratory: Negative for apnea, cough, choking, chest tightness, shortness of breath, wheezing and stridor.   Cardiovascular: Negative for chest pain, palpitations and leg swelling.  Gastrointestinal: Negative for abdominal distention, abdominal pain, anal bleeding, blood in stool, constipation, diarrhea, nausea, rectal pain and vomiting.  Endocrine: Negative for cold intolerance, heat intolerance, polydipsia, polyphagia and polyuria.  Genitourinary: Negative for decreased urine volume, difficulty urinating, dyspareunia, dysuria, enuresis, flank pain, frequency, genital sores, hematuria, menstrual problem, pelvic pain, urgency, vaginal bleeding, vaginal discharge and vaginal pain.  Musculoskeletal: Negative for arthralgias, back pain, gait problem, joint swelling, myalgias, neck pain and neck stiffness.  Skin: Negative for color change, pallor, rash and wound.  Allergic/Immunologic: Negative for environmental allergies, food allergies and immunocompromised state.  Neurological: Negative for dizziness, tremors, seizures, syncope, facial asymmetry, speech difficulty, weakness, light-headedness, numbness and headaches.  Hematological: Negative for adenopathy. Does not bruise/bleed easily.  Psychiatric/Behavioral: Negative for agitation, behavioral problems, confusion, decreased concentration, dysphoric mood, hallucinations, self-injury, sleep disturbance and suicidal ideas. The patient is not nervous/anxious and is not hyperactive.     Past Medical History:  Diagnosis Date  . Absence of menstruation   . Allergic rhinitis, cause unspecified   . Anxiety state, unspecified   . Arthritis    DDD L4-5; ruptured disc; laminectomy.  . Cancer ([place]) 09/18/2010   thyroid cancer; s/p  thyroidectomy total; s/p ablation.  [place].  . Chicken pox   . Diabetes mellitus   . Dysplasia  of cervix, unspecified   . Dysthymic disorder   . Hematuria, unspecified   . Hypersomnia, unspecified    night shift work  . Insomnia, unspecified   . Mixed hyperlipidemia   . Other abnormal glucose   . Other acne   . Thyroid disease   . Tobacco use disorder   . Unspecified vitamin D deficiency    Past Surgical History:  Procedure Laterality Date  . history of LEEP  2005  . LAMINECTOMY  1996   L4-L5  . lipoma of shoulder  2005   Right . removed  . SPINE SURGERY  03/20/1994   L4-L5 laminectomy  . TOTAL THYROIDECTOMY  2012  . TUBAL LIGATION  1998   Allergies  Allergen Reactions  . Sulfa Antibiotics Hives    All over body   Current Outpatient Medications on File Prior to Visit  Medication Sig Dispense Refill  . buPROPion (WELLBUTRIN XL) 300 MG 24 hr tablet Take 300 mg by mouth daily.  2  . levothyroxine (SYNTHROID, LEVOTHROID) 150 MCG tablet Take 1 tablet (150 mcg total) by mouth daily before breakfast. 90 tablet 3  . lisdexamfetamine (VYVANSE) 50 MG capsule     . metaxalone (SKELAXIN) 800 MG tablet Take 1 tablet (800 mg total) by mouth 3 (three) times daily as needed for muscle spasms. 60 tablet 2  . sertraline (ZOLOFT) 100 MG tablet TAKE 1 TABLET BY MOUTH EVERY DAY 90 tablet 1   No current facility-administered medications on file prior to visit.    Social History   Socioeconomic History  . Marital status: Married    Spouse name: Dawayne Cirri  . Number of children: 3  . Years of education: Not on file  . Highest education level: Not on file  Occupational History  . Occupation: Psych ED    Employer: [employer]  . Occupation: Programmer, multimedia: [employer]  . Financial resource strain: Not on file  . Food insecurity:    Worry: Not on file    Inability: Not on file  . Transportation needs:    Medical: Not on file    Non-medical: Not on file  Tobacco  Use  . Smoking status: Current Every Day Smoker    Packs/day: 0.50    Years: 27.00    Pack years: 13.50    Types: Cigarettes  . Smokeless tobacco: Never Used  Substance and Sexual Activity  . Alcohol use: No    Alcohol/week: 0.0 oz  . Drug use: No  . Sexual activity: Yes    Comment: same sexual partner  Lifestyle  . Physical activity:    Days per week: Not on file    Minutes per session: Not on file  . Stress: Not on file  Relationships  . Social connections:    Talks on phone: Not on file    Gets together: Not on file    Attends religious service: Not on file    Active member of club or organization: Not on file    Attends meetings of clubs or organizations: Not on file    Relationship status: Not on file  . Intimate partner violence:    Fear of current or ex partner: Not on file    Emotionally abused: Not on file    Physically abused: Not on file    Forced sexual activity: Not on file  Other Topics Concern  . Not on file  Social History Narrative   Marital status:  Married same  sex partner 12/2013 Dawayne Cirri. Happy, no abuse.      Children:  3 children (24, 22, 20); 3 grandchildren.        Lives: with wife.      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  Two sixteen hour shifts followed by 8 hour shift; weekends.  Off Mon-Thurs.  [place] job.  Graduate school in 2019.      Tobacco: 1 ppd x 30 years.        Alcohol: none; partner/wife in recovery.      Drugs: none      Exercise: none      Caffeine use: diet Pepsi and coffee.      Always uses seat belts.       Smoke alarm and carbon monoxide detector in the home.       No guns.    Family History  Problem Relation Age of Onset  . Depression Mother   . Kidney disease Mother        R Nephrectomy kidney stones  . Irritable bowel syndrome Mother   . Diabetes Father   . Depression Father   . Cancer Maternal Grandfather   . Bipolar disorder Son   . ADD / ADHD Son   .  Hypertension Paternal Grandmother   . ADD / ADHD Brother   . ADD / ADHD Brother        Objective:    BP 118/78   Pulse (!) 112   Temp 98.8 F (37.1 C) (Oral)   Resp 16   Ht 5' 8.5" (1.74 m)   Wt 232 lb (105.2 kg)   LMP 03/05/2013   SpO2 98%   BMI 34.76 kg/m  Physical Exam  Constitutional: She is oriented to person, place, and time. She appears well-developed and well-nourished. No distress.  HENT:  Head: Normocephalic and atraumatic.  Right Ear: External ear normal.  Left Ear: External ear normal.  Nose: Nose normal.  Mouth/Throat: Oropharynx is clear and moist.  Eyes: Pupils are equal, round, and reactive to light. Conjunctivae and EOM are normal.  Neck: Normal range of motion and full passive range of motion without pain. Neck supple. No JVD present. Carotid bruit is not present. No thyromegaly present.  Cardiovascular: Normal rate, regular rhythm and normal heart sounds. Exam reveals no gallop and no friction rub.  No murmur heard. Pulmonary/Chest: Effort normal and breath sounds normal. She has no wheezes. She has no rales.  Abdominal: Soft. Bowel sounds are normal. She exhibits no distension and no mass. There is no tenderness. There is no rebound and no guarding.  Musculoskeletal:       Right shoulder: Normal.       Left shoulder: Normal.       Cervical back: Normal.  Lymphadenopathy:    She has no cervical adenopathy.  Neurological: She is alert and oriented to person, place, and time. She has normal reflexes. No cranial nerve deficit. She exhibits normal muscle tone. Coordination normal.  Skin: Skin is warm and dry. No rash noted. She is not diaphoretic. No erythema. No pallor.  Psychiatric: She has a normal mood and affect. Her behavior is normal. Judgment and thought content normal.  Nursing note and vitals reviewed.  No results found. Depression screen [facility] 2/9 10/10/2017 07/07/2017 10/11/2016 08/07/2016 11/24/2015  Decreased Interest 0 0 0 0 0  Down, Depressed,  Hopeless 0 0 0 0 0  PHQ - 2 Score 0 0 0 0 0  Fall Risk  10/10/2017 10/11/2016 08/07/2016 11/24/2015 05/04/2015  Falls in the past year? No No No No No        Assessment & Plan:   1. Glucose intolerance (impaired glucose tolerance)   2. Depression with anxiety   3. Pure hypercholesterolemia   4. Hematuria, microscopic   5. Elevated LFTs   6. Cancer of thyroid T3N0 S/P thyroidectomy 10/07/10   7. Seasonal allergic rhinitis due to pollen   8. Cervical high risk HPV (human papillomavirus) test positive   9. Attention deficit hyperactivity disorder (ADHD), combined type     Microscopic hematuria: Persistent.  Patient agreeable to contacting urology to schedule consultation.  History of tobacco abuse does warrant cystoscopy and CT kidneys.  No gross hematuria at home or in office.  Elevated liver function studies: Chronic.  Likely secondary to fatty liver.  Recommend weight loss, exercise, low-cholesterol food choices.  Avoid Tylenol products  Glucose intolerance and hypercholesterolemia: Stable.  Recommend weight loss, exercise, low-fat and low sugar food choices.  Obtain labs.  Depression with anxiety and ADHD: Managed by psychiatry.  Doing very well at this time emotionally.  Personal life and professional life going very well.  Tobacco abuse: Congratulations on smoking cessation.  What an accomplishment! an accomplishment!  Normal Pap smear with high risk HPV: Patient also due for follow-up with Dr. Charlesetta Garibaldi.  Orders Placed This Encounter  Procedures  . Urine Culture  . Comprehensive metabolic panel    Order Specific Question:   Has the patient fasted?    Answer:   No  . Lipid panel    Order Specific Question:   Has the patient fasted?    Answer:   No  . Urine Microscopic  . POCT urinalysis dipstick   No orders of the defined types were placed in this encounter.   Return in about 6 months (around 04/12/2018) for follow-up chronic medical conditions [place].   Quinton Voth  Elayne Guerin, M.D. Primary [facility] at [facility] [address] [place], [place]  [address] [phone] phone [phone] fax

## 2017-10-11 DIAGNOSIS — F902 Attention-deficit hyperactivity disorder, combined type: Secondary | ICD-10-CM | POA: Insufficient documentation

## 2017-10-11 LAB — URINALYSIS, MICROSCOPIC ONLY
BACTERIA UA: NONE SEEN
RBC, UA: >30 /hpf — AB (ref 0–2)

## 2017-10-12 LAB — URINE CULTURE: Organism ID, Bacteria: NO GROWTH

## 2017-11-29 ENCOUNTER — Ambulatory Visit: Payer: Self-pay | Admitting: Urology

## 2017-11-29 ENCOUNTER — Encounter: Payer: Self-pay | Admitting: Urology

## 2017-11-29 NOTE — Progress Notes (Deleted)
11/29/2017 8:41 AM   Peter Minium 07/21/1970 [id number]  Referring provider: Wardell Honour, MD [address], [place] [address]  No chief complaint on file.   HPI:    PMH: Past Medical History:  Diagnosis Date  . Absence of menstruation   . Allergic rhinitis, cause unspecified   . Anxiety state, unspecified   . Arthritis    DDD L4-5; ruptured disc; laminectomy.  . Cancer ([place]) 09/18/2010   thyroid cancer; s/p thyroidectomy total; s/p ablation.  [place].  . Chicken pox   . Diabetes mellitus   . Dysplasia of cervix, unspecified   . Dysthymic disorder   . Hematuria, unspecified   . Hypersomnia, unspecified    night shift work  . Insomnia, unspecified   . Mixed hyperlipidemia   . Other abnormal glucose   . Other acne   . Thyroid disease   . Tobacco use disorder   . Unspecified vitamin D deficiency     Surgical History: Past Surgical History:  Procedure Laterality Date  . history of LEEP  2005  . LAMINECTOMY  1996   L4-L5  . lipoma of shoulder  2005   Right . removed  . SPINE SURGERY  03/20/1994   L4-L5 laminectomy  . TOTAL THYROIDECTOMY  2012  . TUBAL LIGATION  1998    Home Medications:  Allergies as of 11/29/2017      Reactions   Sulfa Antibiotics Hives   All over body      Medication List        Accurate as of 11/29/17  8:41 AM. Always use your most recent med list.          buPROPion 300 MG 24 hr tablet Commonly known as:  WELLBUTRIN XL Take 300 mg by mouth daily.   levothyroxine 150 MCG tablet Commonly known as:  SYNTHROID, LEVOTHROID Take 1 tablet (150 mcg total) by mouth daily before breakfast.   metaxalone 800 MG tablet Commonly known as:  SKELAXIN Take 1 tablet (800 mg total) by mouth 3 (three) times daily as needed for muscle spasms.   sertraline 100 MG tablet Commonly known as:  ZOLOFT TAKE 1 TABLET BY MOUTH EVERY DAY   VYVANSE 50 MG capsule Generic drug:  lisdexamfetamine       Allergies:  Allergies    Allergen Reactions  . Sulfa Antibiotics Hives    All over body    Family History: Family History  Problem Relation Age of Onset  . Depression Mother   . Kidney disease Mother        R Nephrectomy kidney stones  . Irritable bowel syndrome Mother   . Diabetes Father   . Depression Father   . Cancer Maternal Grandfather   . Bipolar disorder Son   . ADD / ADHD Son   . Hypertension Paternal Grandmother   . ADD / ADHD Brother   . ADD / ADHD Brother     Social History:  reports that she has been smoking cigarettes. She has a 13.50 pack-year smoking history. She has never used smokeless tobacco. She reports that she does not drink alcohol or use drugs.  ROS:                                        Physical Exam: LMP 03/05/2013   Constitutional:  Alert and oriented, No acute distress. HEENT: [place] AT, moist mucus  membranes.  Trachea midline, no masses. Cardiovascular: No clubbing, cyanosis, or edema. Respiratory: Normal respiratory effort, no increased work of breathing. GI: Abdomen is soft, nontender, nondistended, no abdominal masses GU: No CVA tenderness Lymph: No cervical or inguinal lymphadenopathy. Skin: No rashes, bruises or suspicious lesions. Neurologic: Grossly intact, no focal deficits, moving all 4 extremities. Psychiatric: Normal mood and affect.  Laboratory Data: Lab Results  Component Value Date   WBC 5.3 07/07/2017   HGB 12.5 07/07/2017   HCT 37.6 07/07/2017   MCV 88 07/07/2017   PLT 177 07/07/2017    Lab Results  Component Value Date   CREATININE 0.85 10/10/2017    No results found for: PSA  No results found for: TESTOSTERONE  Lab Results  Component Value Date   HGBA1C 5.5 07/07/2017    Urinalysis    Component Value Date/Time   BILIRUBINUR small (A) 10/10/2017 0931   BILIRUBINUR neg 09/17/2013 1017   KETONESUR small (15) (A) 10/10/2017 0931   PROTEINUR negative 10/10/2017 0931   PROTEINUR neg 09/17/2013 1017    UROBILINOGEN 1.0 10/10/2017 0931   NITRITE Negative 10/10/2017 0931   NITRITE neg 09/17/2013 1017   LEUKOCYTESUR Trace (A) 10/10/2017 0931    Lab Results  Component Value Date   WBCUA 0-5 10/10/2017   RBCUA >30 (A) 10/10/2017   LABEPIT 0-10 10/10/2017   MUCUS Present 10/10/2017   BACTERIA None seen 10/10/2017    Pertinent Imaging: *** No results found for this or any previous visit. No results found for this or any previous visit. No results found for this or any previous visit. No results found for this or any previous visit. No results found for this or any previous visit. No results found for this or any previous visit. No results found for this or any previous visit. No results found for this or any previous visit.  Assessment & Plan:    There are no diagnoses linked to this encounter.  No follow-ups on file.  Hollice Espy, MD  [facility] [address] [place],  [address] [phone]

## 2017-12-01 ENCOUNTER — Encounter: Payer: Self-pay | Admitting: *Deleted

## 2017-12-18 ENCOUNTER — Encounter: Payer: Self-pay | Admitting: Psychiatry

## 2017-12-18 ENCOUNTER — Ambulatory Visit (INDEPENDENT_AMBULATORY_CARE_PROVIDER_SITE_OTHER): Payer: Managed Care, Other (non HMO) | Admitting: Psychiatry

## 2017-12-18 VITALS — BP 136/84 | HR 107

## 2017-12-18 DIAGNOSIS — F900 Attention-deficit hyperactivity disorder, predominantly inattentive type: Secondary | ICD-10-CM

## 2017-12-18 MED ORDER — LISDEXAMFETAMINE DIMESYLATE 50 MG PO CAPS: 50.0000 mg | ORAL_CAPSULE | Freq: Every day | ORAL | 0 refills | Status: DC

## 2017-12-18 MED ORDER — BUPROPION HCL ER (XL) 300 MG PO TB24: 300.0000 mg | ORAL_TABLET | Freq: Every day | ORAL | 1 refills | Status: DC

## 2017-12-18 MED ORDER — SERTRALINE HCL 100 MG PO TABS: 100.0000 mg | ORAL_TABLET | Freq: Every day | ORAL | 1 refills | Status: DC

## 2017-12-18 NOTE — Progress Notes (Addendum)
[facility]  Patient ID: Taylor Powers,  MRN: [id number]  PCP: Wardell Honour, MD  Date of Evaluation: 12/18/2017 Time spent:20 minutes   HISTORY/CURRENT STATUS: Continues to do well.   Individual Medical History/ Review of Systems: Changes? :Yes doing well  Allergies: Sulfa antibiotics  Current Medications:  Current Outpatient Medications:  .  buPROPion (WELLBUTRIN XL) 300 MG 24 hr tablet, Take 300 mg by mouth daily., Disp: , Rfl: 2 .  levothyroxine (SYNTHROID, LEVOTHROID) 150 MCG tablet, Take 1 tablet (150 mcg total) by mouth daily before breakfast., Disp: 90 tablet, Rfl: 3 .  lisdexamfetamine (VYVANSE) 50 MG capsule, , Disp: , Rfl:  .  metaxalone (SKELAXIN) 800 MG tablet, Take 1 tablet (800 mg total) by mouth 3 (three) times daily as needed for muscle spasms., Disp: 60 tablet, Rfl: 2 .  sertraline (ZOLOFT) 100 MG tablet, TAKE 1 TABLET BY MOUTH EVERY DAY, Disp: 90 tablet, Rfl: 1 Medication Side Effects: None  Family Medical/ Social History: Changes? No  MENTAL HEALTH EXAM:  Blood pressure 136/84, pulse (!) 107, last menstrual period 03/05/2013.There is no height or weight on file to calculate BMI.  General Appearance: Casual  Eye Contact:  Good  Speech:  Normal Rate  Volume:  Normal  Mood:  Euphoric  Affect:  Appropriate and Congruent  Thought Process:  Linear  Orientation:  Full (Time, Place, and Person)  Thought Content: Logical   Suicidal Thoughts:  No  Homicidal Thoughts:  No  Memory:  Immediate  Judgement:  Good  Insight:  Good  Psychomotor Activity:  Normal  Concentration:  Concentration: Good  Recall:  Good  Fund of Knowledge: Good  Language: Good  Akathisia:  NA  AIMS (if indicated): na  Assets:  Social Support  ADL's:  Intact  Cognition: WNL  Prognosis:  Good    DIAGNOSES:    ICD-10-CM   1. Attention deficit hyperactivity disorder (ADHD), predominantly inattentive type F90.0     RECOMMENDATIONS: check bp at home, call if  elevated Mood diary next [place], PA-C

## 2018-03-25 ENCOUNTER — Other Ambulatory Visit: Payer: Self-pay | Admitting: Psychiatry

## 2018-03-25 ENCOUNTER — Telehealth: Payer: Self-pay | Admitting: Psychiatry

## 2018-03-25 MED ORDER — LISDEXAMFETAMINE DIMESYLATE 50 MG PO CAPS: 50.0000 mg | ORAL_CAPSULE | Freq: Every day | ORAL | 0 refills | Status: DC

## 2018-03-25 NOTE — Telephone Encounter (Signed)
Pt. Called and said pt needs a refill oon her vyvanse 50 mg. Sent to the [facility] on [address]

## 2018-04-09 ENCOUNTER — Other Ambulatory Visit: Payer: Self-pay

## 2018-04-09 ENCOUNTER — Ambulatory Visit: Payer: 59 | Admitting: Emergency Medicine

## 2018-04-09 ENCOUNTER — Encounter: Payer: Self-pay | Admitting: Emergency Medicine

## 2018-04-09 VITALS — BP 111/74 | HR 118 | Temp 98.0°F | Resp 16 | Ht 68.0 in | Wt 232.0 lb

## 2018-04-09 DIAGNOSIS — R1010 Upper abdominal pain, unspecified: Secondary | ICD-10-CM | POA: Diagnosis not present

## 2018-04-09 NOTE — Progress Notes (Addendum)
Taylor Powers 48 y.o.   Chief Complaint  Patient presents with  . Abdominal Pain    pt states she has pain since Sat. with N/V     HISTORY OF PRESENT ILLNESS: This is a 48 y.o. female complaining of upper abdominal pain that started last Saturday evening 2 hours after evening meal with vomiting.  No diarrhea.  Went to the urgent care center the following morning, had blood work done, given Zofran.  No more vomiting since.  Slowly improving.  No new symptoms.  No flulike symptoms.  Not taking any medication at present time.  On clear liquid diet.  HPI   Prior to Admission medications   Medication Sig Start Date End Date Taking? Authorizing Provider  buPROPion (WELLBUTRIN XL) 300 MG 24 hr tablet Take 1 tablet (300 mg total) by mouth daily. 12/18/17  Yes Shugart, Lissa Hoard, PA-C  levothyroxine (SYNTHROID, LEVOTHROID) 150 MCG tablet Take 1 tablet (150 mcg total) by mouth daily before breakfast. 07/07/17  Yes Wardell Honour, MD  lisdexamfetamine (VYVANSE) 50 MG capsule Take 1 capsule (50 mg total) by mouth daily. 03/25/18  Yes Shugart, Lissa Hoard, PA-C  metaxalone (SKELAXIN) 800 MG tablet Take 1 tablet (800 mg total) by mouth 3 (three) times daily as needed for muscle spasms. 10/11/16  Yes Wardell Honour, MD  ondansetron (ZOFRAN-ODT) 4 MG disintegrating tablet Take by mouth. 04/07/18 04/12/18 Yes [provider]  sertraline (ZOLOFT) 100 MG tablet Take 1 tablet (100 mg total) by mouth daily. 12/18/17  Yes Shugart, Lissa Hoard, PA-C    Allergies  Allergen Reactions  . Sulfa Antibiotics Hives    All over body    Patient Active Problem List   Diagnosis Date Noted  . Attention deficit hyperactivity disorder (ADHD), combined type 10/11/2017  . Postsurgical hypothyroidism 03/30/2015  . Glucose intolerance (impaired glucose tolerance) 10/14/2014  . Hematuria 10/14/2014  . Depression with anxiety 05/20/2013  . Allergic rhinitis 05/20/2013  . Snoring 05/20/2013  . Pure hypercholesterolemia 05/20/2013  .  Cervical high risk HPV (human papillomavirus) test positive 05/20/2013  . Cancer of thyroid T3N0 S/P thyroidectomy 10/07/10 11/03/2010    Past Medical History:  Diagnosis Date  . Absence of menstruation   . Allergic rhinitis, cause unspecified   . Anxiety state, unspecified   . Arthritis    DDD L4-5; ruptured disc; laminectomy.  . Cancer ([place]) 09/18/2010   thyroid cancer; s/p thyroidectomy total; s/p ablation.  [place].  . Chicken pox   . Diabetes mellitus   . Dysplasia of cervix, unspecified   . Dysthymic disorder   . Hematuria, unspecified   . Hypersomnia, unspecified    night shift work  . Insomnia, unspecified   . Mixed hyperlipidemia   . Other abnormal glucose   . Other acne   . Thyroid disease   . Tobacco use disorder   . Unspecified vitamin D deficiency     Past Surgical History:  Procedure Laterality Date  . history of LEEP  2005  . LAMINECTOMY  1996   L4-L5  . lipoma of shoulder  2005   Right . removed  . SPINE SURGERY  03/20/1994   L4-L5 laminectomy  . TOTAL THYROIDECTOMY  2012  . TUBAL LIGATION  1998    Social History   Socioeconomic History  . Marital status: Married    Spouse name: Dawayne Cirri  . Number of children: 3  . Years of education: Not on file  . Highest education level: Not on file  Occupational History  .  Occupation: Psych ED    Employer: [employer]  . Occupation: Programmer, multimedia: [place]  . Financial resource strain: Not on file  . Food insecurity:    Worry: Not on file    Inability: Not on file  . Transportation needs:    Medical: Not on file    Non-medical: Not on file  Tobacco Use  . Smoking status: Current Every Day Smoker    Packs/day: 0.50    Years: 27.00    Pack years: 13.50    Types: Cigarettes  . Smokeless tobacco: Never Used  Substance and Sexual Activity  . Alcohol use: No    Alcohol/week: 0.0 standard drinks  . Drug use: No  . Sexual activity: Yes    Comment: same sexual partner    Lifestyle  . Physical activity:    Days per week: Not on file    Minutes per session: Not on file  . Stress: Not on file  Relationships  . Social connections:    Talks on phone: Not on file    Gets together: Not on file    Attends religious service: Not on file    Active member of club or organization: Not on file    Attends meetings of clubs or organizations: Not on file    Relationship status: Not on file  . Intimate partner violence:    Fear of current or ex partner: Not on file    Emotionally abused: Not on file    Physically abused: Not on file    Forced sexual activity: Not on file  Other Topics Concern  . Not on file  Social History Narrative   Marital status:  Married same sex partner 12/2013 Dawayne Cirri. Happy, no abuse.      Children:  3 children (24, 22, 20); 3 grandchildren.        Lives: with wife.      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  Two sixteen hour shifts followed by 8 hour shift; weekends.  Off Mon-Thurs.  [place] job.  Graduate school in 2019.      Tobacco: 1 ppd x 30 years.        Alcohol: none; partner/wife in recovery.      Drugs: none      Exercise: none      Caffeine use: diet Pepsi and coffee.      Always uses seat belts.       Smoke alarm and carbon monoxide detector in the home.       No guns.     Family History  Problem Relation Age of Onset  . Depression Mother   . Kidney disease Mother        R Nephrectomy kidney stones  . Irritable bowel syndrome Mother   . Diabetes Father   . Depression Father   . Cancer Maternal Grandfather   . Bipolar disorder Son   . ADD / ADHD Son   . Hypertension Paternal Grandmother   . ADD / ADHD Brother   . ADD / ADHD Brother      Review of Systems  Constitutional: Negative.  Negative for chills and fever.  HENT: Negative.  Negative for sore throat.   Eyes: Negative.   Respiratory: Negative.  Negative for cough and shortness of breath.    Cardiovascular: Negative.  Negative for chest pain and palpitations.  Gastrointestinal: Positive for abdominal pain, nausea and vomiting. Negative for blood  in stool, constipation, diarrhea, heartburn and melena.  Genitourinary: Negative.   Musculoskeletal: Negative.   Skin: Negative.  Negative for rash.  Neurological: Negative.  Negative for dizziness and headaches.  Endo/Heme/Allergies: Negative.   All other systems reviewed and are negative.   Vitals:   04/09/18 1130  BP: 111/74  Pulse: (!) 118  Resp: 16  Temp: 98 F (36.7 C)  SpO2: 97%    Physical Exam Vitals signs reviewed. Exam conducted with a chaperone present.  Constitutional:      Appearance: She is well-developed.  HENT:     Head: Normocephalic and atraumatic.     Mouth/Throat:     Mouth: Mucous membranes are moist.     Pharynx: Oropharynx is clear.  Eyes:     Extraocular Movements: Extraocular movements intact.     Conjunctiva/sclera: Conjunctivae normal.     Pupils: Pupils are equal, round, and reactive to light.  Neck:     Musculoskeletal: Normal range of motion and neck supple.  Cardiovascular:     Rate and Rhythm: Normal rate and regular rhythm.     Heart sounds: Normal heart sounds.  Pulmonary:     Effort: Pulmonary effort is normal.     Breath sounds: Normal breath sounds.  Abdominal:     General: Bowel sounds are normal. There is no distension.     Palpations: Abdomen is soft. There is no mass.     Tenderness: There is no abdominal tenderness. There is no guarding.  Musculoskeletal: Normal range of motion.  Skin:    General: Skin is warm and dry.  Neurological:     General: No focal deficit present.     Mental Status: She is alert and oriented to person, place, and time.  Psychiatric:        Mood and Affect: Mood normal.        Behavior: Behavior normal.   A total of 25 minutes was spent in the room with the patient, greater than 50% of which was in counseling/coordination of care regarding  differential diagnosis, treatment, medications, need for diagnostic work-up and follow-up.  The 10-year ASCVD risk score Mikey Bussing DC Brooke Bonito., et al., 2013) is: 3.6%   Values used to calculate the score:     Age: 33 years     Sex: Female     Is Non-Hispanic African American: No     Diabetic: No     Tobacco smoker: Yes     Systolic Blood Pressure: 355 mmHg     Is BP treated: No     HDL Cholesterol: 43 mg/dL     Total Cholesterol: 214 mg/dL   ASSESSMENT & PLAN: Upper abdominal pain Gallstones with biliary colic likely possibility.  Clinically stable and improving.  No red flag signs or symptoms.  No fever.  No longer vomiting.  Able to hold food or liquids down.  Benign abdominal examination.  Will repeat CBC and CMP today and schedule patient for gallbladder ultrasound.  We will follow-up after that.  Anntionette was seen today for abdominal pain.  Diagnoses and all orders for this visit:  Upper abdominal pain -     CBC with Differential/Platelet -     Comprehensive metabolic panel -     Lipase -     Lipid panel -     US Abdomen Limited RUQ; Future    Patient Instructions  Abdominal Pain, Adult  Many things can cause belly (abdominal) pain. Most times, belly pain is not dangerous. Many cases of  belly pain can be watched and treated at home. Sometimes belly pain is serious, though. Your doctor will try to find the cause of your belly pain. Follow these instructions at home:  Take over-the-counter and prescription medicines only as told by your doctor. Do not take medicines that help you poop (laxatives) unless told to by your doctor.  Drink enough fluid to keep your pee (urine) clear or pale yellow.  Watch your belly pain for any changes.  Keep all follow-up visits as told by your doctor. This is important. Contact a doctor if:  Your belly pain changes or gets worse.  You are not hungry, or you lose weight without trying.  You are having trouble pooping (constipated) or have  watery poop (diarrhea) for more than 2-3 days.  You have pain when you pee or poop.  Your belly pain wakes you up at night.  Your pain gets worse with meals, after eating, or with certain foods.  You are throwing up and cannot keep anything down.  You have a fever. Get help right away if:  Your pain does not go away as soon as your doctor says it should.  You cannot stop throwing up.  Your pain is only in areas of your belly, such as the right side or the left lower part of the belly.  You have bloody or black poop, or poop that looks like tar.  You have very bad pain, cramping, or bloating in your belly.  You have signs of not having enough fluid or water in your body (dehydration), such as: ? Dark pee, very little pee, or no pee. ? Cracked lips. ? Dry mouth. ? Sunken eyes. ? Sleepiness. ? Weakness. This information is not intended to replace advice given to you by your health care provider. Make sure you discuss any questions you have with your health care provider. Document Released: 08/23/2007 Document Revised: 09/24/2015 Document Reviewed: 08/18/2015 Elsevier Interactive Patient Education  2019 Elsevier Inc.      Agustina Caroli, MD [facility]

## 2018-04-09 NOTE — Patient Instructions (Signed)

## 2018-04-09 NOTE — Assessment & Plan Note (Addendum)
Gallstones with biliary colic likely possibility.  Clinically stable and improving.  No red flag signs or symptoms.  No fever.  No longer vomiting.  Able to hold food or liquids down.  Benign abdominal examination.  Will repeat CBC and CMP today and schedule patient for gallbladder ultrasound.  We will follow-up after that.

## 2018-04-10 LAB — CBC WITH DIFFERENTIAL/PLATELET
Basophils Absolute: 0.1 10*3/uL (ref 0.0–0.2)
Basos: 1 %
EOS (ABSOLUTE): 0.1 10*3/uL (ref 0.0–0.4)
Eos: 1 %
HEMOGLOBIN: 13.3 g/dL (ref 11.1–15.9)
Hematocrit: 39.9 % (ref 34.0–46.6)
Immature Grans (Abs): 0 10*3/uL (ref 0.0–0.1)
Immature Granulocytes: 0 %
LYMPHS: 26 %
Lymphocytes Absolute: 2.5 10*3/uL (ref 0.7–3.1)
MCH: 28.7 pg (ref 26.6–33.0)
MCHC: 33.3 g/dL (ref 31.5–35.7)
MCV: 86 fL (ref 79–97)
MONOCYTES: 9 %
Monocytes Absolute: 0.8 10*3/uL (ref 0.1–0.9)
Neutrophils Absolute: 6.2 10*3/uL (ref 1.4–7.0)
Neutrophils: 63 %
Platelets: 238 10*3/uL (ref 150–450)
RBC: 4.63 x10E6/uL (ref 3.77–5.28)
RDW: 12.1 % (ref 11.7–15.4)
WBC: 9.7 10*3/uL (ref 3.4–10.8)

## 2018-04-10 LAB — COMPREHENSIVE METABOLIC PANEL
ALT: 31 IU/L (ref 0–32)
AST: 19 IU/L (ref 0–40)
Albumin/Globulin Ratio: 1.6 (ref 1.2–2.2)
Albumin: 4.5 g/dL (ref 3.8–4.8)
Alkaline Phosphatase: 114 IU/L (ref 39–117)
BUN/Creatinine Ratio: 15 (ref 9–23)
BUN: 15 mg/dL (ref 6–24)
Bilirubin Total: 0.5 mg/dL (ref 0.0–1.2)
CALCIUM: 9.7 mg/dL (ref 8.7–10.2)
CO2: 21 mmol/L (ref 20–29)
CREATININE: 0.99 mg/dL (ref 0.57–1.00)
Chloride: 98 mmol/L (ref 96–106)
GFR calc non Af Amer: 68 mL/min/{1.73_m2} (ref >59)
GFR, EST AFRICAN AMERICAN: 78 mL/min/{1.73_m2} (ref >59)
Globulin, Total: 2.8 g/dL (ref 1.5–4.5)
Glucose: 79 mg/dL (ref 65–99)
Potassium: 3.9 mmol/L (ref 3.5–5.2)
Sodium: 137 mmol/L (ref 134–144)
Total Protein: 7.3 g/dL (ref 6.0–8.5)

## 2018-04-10 LAB — LIPID PANEL
Chol/HDL Ratio: 5 ratio — ABNORMAL HIGH (ref 0.0–4.4)
Cholesterol, Total: 214 mg/dL — ABNORMAL HIGH (ref 100–199)
HDL: 43 mg/dL (ref >39)
LDL Calculated: 137 mg/dL — ABNORMAL HIGH (ref 0–99)
Triglycerides: 170 mg/dL — ABNORMAL HIGH (ref 0–149)
VLDL CHOLESTEROL CAL: 34 mg/dL (ref 5–40)

## 2018-04-10 LAB — LIPASE: Lipase: 15 U/L (ref 14–72)

## 2018-04-11 ENCOUNTER — Telehealth: Payer: Self-pay

## 2018-04-11 ENCOUNTER — Telehealth: Payer: Self-pay | Admitting: Emergency Medicine

## 2018-04-11 ENCOUNTER — Other Ambulatory Visit: Payer: Self-pay | Admitting: Surgery

## 2018-04-11 ENCOUNTER — Ambulatory Visit (HOSPITAL_COMMUNITY)
Admission: RE | Admit: 2018-04-11 | Discharge: 2018-04-11 | Disposition: A | Discharge status: Home / Self Care | Payer: 59 | Source: Ambulatory Visit | Attending: Emergency Medicine | Admitting: Emergency Medicine

## 2018-04-11 ENCOUNTER — Encounter: Payer: Self-pay | Admitting: *Deleted

## 2018-04-11 DIAGNOSIS — K8020 Calculus of gallbladder without cholecystitis without obstruction: Secondary | ICD-10-CM

## 2018-04-11 DIAGNOSIS — R1010 Upper abdominal pain, unspecified: Secondary | ICD-10-CM | POA: Insufficient documentation

## 2018-04-11 NOTE — Telephone Encounter (Signed)
Please advise 

## 2018-04-11 NOTE — Telephone Encounter (Signed)
Received call from [facility] with [facility] with abnormal imaging. Pt has gallstones.  Results given to Dr. Pamella Pert.  Referral for general surgery placed and called pt and informed her of results and if she is to feel worse or have fever, worsening pain, nausea/vomiting, and if her eyes are turning yellow, then she is to go the ER per Dr. Pamella Pert.   I have call the pt and informed her of the message above.   Thanks, [employer]

## 2018-04-11 NOTE — Telephone Encounter (Signed)
Taylor Powers, PRA from [facility] called report; "cholelithiasis with thickened edematous gall bladder wall, and mild pericholecystic fluid; these findings felt to be indicative of acute cholecystitis"; read by Dr Jasmine December; results repeated for correctness; notified Guadelupe Sabin, CMA at [facility]; she verbalized understanding.

## 2018-04-11 NOTE — Telephone Encounter (Signed)
I have received the message and let the provider know. I have documented that I have also let pt know the plan on another encounter for 04/11/18

## 2018-04-12 ENCOUNTER — Other Ambulatory Visit: Payer: Self-pay | Admitting: Emergency Medicine

## 2018-04-12 ENCOUNTER — Encounter (HOSPITAL_COMMUNITY): Payer: Self-pay | Admitting: *Deleted

## 2018-04-12 ENCOUNTER — Encounter (HOSPITAL_COMMUNITY)
Admission: RE | Admit: 2018-04-12 | Discharge: 2018-04-12 | Disposition: A | Discharge status: Home / Self Care | Payer: 59 | Source: Home / Self Care | Attending: Surgery | Admitting: Surgery

## 2018-04-12 ENCOUNTER — Other Ambulatory Visit: Payer: Self-pay

## 2018-04-12 ENCOUNTER — Encounter: Payer: Self-pay | Admitting: Emergency Medicine

## 2018-04-12 ENCOUNTER — Inpatient Hospital Stay (HOSPITAL_COMMUNITY): Admit: 2018-04-12 | Payer: Self-pay

## 2018-04-12 DIAGNOSIS — Z6835 Body mass index (BMI) 35.0-35.9, adult: Secondary | ICD-10-CM | POA: Diagnosis not present

## 2018-04-12 DIAGNOSIS — E669 Obesity, unspecified: Secondary | ICD-10-CM | POA: Diagnosis not present

## 2018-04-12 DIAGNOSIS — Z01818 Encounter for other preprocedural examination: Secondary | ICD-10-CM | POA: Insufficient documentation

## 2018-04-12 DIAGNOSIS — Z79890 Hormone replacement therapy (postmenopausal): Secondary | ICD-10-CM | POA: Diagnosis not present

## 2018-04-12 DIAGNOSIS — F419 Anxiety disorder, unspecified: Secondary | ICD-10-CM | POA: Diagnosis not present

## 2018-04-12 DIAGNOSIS — E039 Hypothyroidism, unspecified: Secondary | ICD-10-CM | POA: Diagnosis not present

## 2018-04-12 DIAGNOSIS — E119 Type 2 diabetes mellitus without complications: Secondary | ICD-10-CM | POA: Diagnosis not present

## 2018-04-12 DIAGNOSIS — F329 Major depressive disorder, single episode, unspecified: Secondary | ICD-10-CM | POA: Diagnosis not present

## 2018-04-12 DIAGNOSIS — Z79899 Other long term (current) drug therapy: Secondary | ICD-10-CM | POA: Diagnosis not present

## 2018-04-12 DIAGNOSIS — Z85850 Personal history of malignant neoplasm of thyroid: Secondary | ICD-10-CM | POA: Diagnosis not present

## 2018-04-12 DIAGNOSIS — K8010 Calculus of gallbladder with chronic cholecystitis without obstruction: Secondary | ICD-10-CM | POA: Diagnosis present

## 2018-04-12 DIAGNOSIS — Z87891 Personal history of nicotine dependence: Secondary | ICD-10-CM | POA: Diagnosis not present

## 2018-04-12 DIAGNOSIS — F909 Attention-deficit hyperactivity disorder, unspecified type: Secondary | ICD-10-CM | POA: Diagnosis not present

## 2018-04-12 DIAGNOSIS — K8012 Calculus of gallbladder with acute and chronic cholecystitis without obstruction: Secondary | ICD-10-CM | POA: Diagnosis not present

## 2018-04-12 NOTE — Telephone Encounter (Signed)
Thanks.  Agree with Dr. Ardyth Gal plan.

## 2018-04-12 NOTE — Telephone Encounter (Signed)
Agree with the plan. Thanks.

## 2018-04-12 NOTE — Progress Notes (Signed)
[place] - Preparing for Surgery Before surgery, you can play an important role.  Because skin is not sterile, your skin needs to be as free of germs as possible.  You can reduce the number of germs on your skin by washing with CHG (chlorahexidine gluconate) soap before surgery.  CHG is an antiseptic cleaner which kills germs and bonds with the skin to continue killing germs even after washing. Please DO NOT use if you have an allergy to CHG or antibacterial soaps.  If your skin becomes reddened/irritated stop using the CHG and inform your nurse when you arrive at Short Stay. Do not shave (including legs and underarms) for at least 48 hours prior to the first CHG shower.  You may shave your face/neck. Please follow these instructions carefully:  1.  Shower with CHG Soap the night before surgery and the  morning of Surgery.  2.  If you choose to wash your hair, wash your hair first as usual with your  normal  shampoo.  3.  After you shampoo, rinse your hair and body thoroughly to remove the  shampoo.                           4.  Use CHG as you would any other liquid soap.  You can apply chg directly  to the skin and wash                       Gently with a scrungie or clean washcloth.  5.  Apply the CHG Soap to your body ONLY FROM THE NECK DOWN.   Do not use on face/ open                           Wound or open sores. Avoid contact with eyes, ears mouth and genitals (private parts).                       Wash face,  Genitals (private parts) with your normal soap.             6.  Wash thoroughly, paying special attention to the area where your surgery  will be performed.  7.  Thoroughly rinse your body with warm water from the neck down.  8.  DO NOT shower/wash with your normal soap after using and rinsing off  the CHG Soap.                9.  Pat yourself dry with a clean towel.            10.  Wear clean pajamas.            11.  Place clean sheets on your bed the night of your first shower and  do not  sleep with pets. Day of Surgery : Do not apply any lotions/deodorants the morning of surgery.  Please wear clean clothes to the hospital/surgery center.  FAILURE TO FOLLOW THESE INSTRUCTIONS MAY RESULT IN THE CANCELLATION OF YOUR SURGERY PATIENT SIGNATURE_________________________________  NURSE SIGNATURE__________________________________  ________________________________________________________________________ NO SOLID FOOD AFTER MIDNIGHT THE NIGHT PRIOR TO SURGERY. NOTHING BY MOUTH EXCEPT CLEAR LIQUIDS UNTIL 3 HOURS PRIOR TO [place] SURGERY. PLEASE FINISH ENSURE DRINK PER SURGEON ORDER 3 HOURS PRIOR TO SCHEDULED SURGERY TIME WHICH NEEDS TO BE COMPLETED AT ____________.   CLEAR LIQUID DIET   Foods Allowed  Foods Excluded  Coffee and tea, regular and decaf                             liquids that you cannot  Plain Jell-O in any flavor                                             see through such as: Fruit ices (not with fruit pulp)                                     milk, soups, orange juice  Iced Popsicles                                    All solid food Carbonated beverages, regular and diet                                    Cranberry, grape and apple juices Sports drinks like Gatorade Lightly seasoned clear broth or consume(fat free) Sugar, honey syrup  Sample Menu Breakfast                                Lunch                                     Supper Cranberry juice                    Beef broth                            Chicken broth Jell-O                                     Grape juice                           Apple juice Coffee or tea                        Jell-O                                      Popsicle                                                Coffee or tea                        Coffee or tea  _____________________________________________________________________

## 2018-04-12 NOTE — Anesthesia Preprocedure Evaluation (Addendum)
Anesthesia Evaluation  Patient identified by MRN, date of birth, ID band Patient awake    Reviewed: Allergy & Precautions, NPO status , Patient's Chart, lab work & pertinent test results  History of Anesthesia Complications Negative for: history of anesthetic complications  Airway Mallampati: II  TM Distance: >3 FB Neck ROM: Full    Dental  (+) Teeth Intact, Dental Advisory Given   Pulmonary Current Smoker,    Pulmonary exam normal breath sounds clear to auscultation       Cardiovascular negative cardio ROS Normal cardiovascular exam Rhythm:Regular Rate:Normal     Neuro/Psych Anxiety Depression Hx of L4-5 laminectomy negative neurological ROS     GI/Hepatic negative GI ROS, Neg liver ROS, Cholecystitis   Endo/Other  diabetesHypothyroidism Obese, BMI 35  Renal/GU negative Renal ROS     Musculoskeletal  (+) Arthritis ,   Abdominal   Peds  Hematology negative hematology ROS (+)   Anesthesia Other Findings Day of surgery medications reviewed with the patient.  Reproductive/Obstetrics                           Anesthesia Physical Anesthesia Plan  ASA: II  Anesthesia Plan: General   Post-op Pain Management:    Induction: Intravenous  PONV Risk Score and Plan: 3 and Treatment may vary due to age or medical condition, Ondansetron, Dexamethasone and Midazolam  Airway Management Planned: Oral ETT  Additional Equipment:   Intra-op Plan:   Post-operative Plan: Extubation in OR  Informed Consent: I have reviewed the patients History and Physical, chart, labs and discussed the procedure including the risks, benefits and alternatives for the proposed anesthesia with the patient or authorized representative who has indicated his/her understanding and acceptance.     Dental advisory given  Plan Discussed with: CRNA  Anesthesia Plan Comments:        Anesthesia Quick  Evaluation

## 2018-04-12 NOTE — Progress Notes (Signed)
Completed preop phone call with history and instructions over phone.  Patient came to entrance of hospital and was given Ensure preop drink along with hibiclens for preop showers with instructions. Copy of instructionsi given to patient on chart.Taylor Powers

## 2018-04-14 NOTE — H&P (Signed)
Taylor Powers  Location: [facility] Patient #: [id number] DOB: 1970-08-11 Married / Language: English / Race: White Female  History of Present Illness   The patient is a 48 year old female who presents with a complaint of gall bladder disease.  The PCP is Dr. Agustina Caroli.  The patient was referred by Dr. Agustina Caroli.  She comes by herself.  She had some vague abdominal pain in the past, but never severe. This past weekend she developed worsening pain and vomited. She went to an urgent care center who found her labs normal. She saw her primary care physician, Dr. Mitchel Honour on January 21. He obtained an ultrasound which was done today. Her mother had gall bladder surgery 30+ years ago. She has no history of gastrointestinal disease. She has no stomach, liver, pancreas, or colon disease. She has not had a colonoscopy. She had an [place] on 04/11/2017 which showed 1. Cholelithiasis with thickened, edematous gallbladder wall and mild pericholecystic fluid. These are findings felt to be indicativeof acute cholecystitis. 2. Diffuse increase in liver echogenicity, an appearance indicative of hepatic steatosis. WBC - 04/09/2018 - 9,700  I discussed with the patient the indications and risks of gall bladder surgery. The primary risks of gall bladder surgery include, but are not limited to, bleeding, infection, common bile duct injury, and open surgery. There is also the risk that the patient may have continued symptoms after surgery. We discussed the typical post-operative recovery course. I tried to answer the patient's questions. I gave the patient literature about gall bladder surgery.  Plan: 1) Will schedule gall bladder surgery for her in the near future.  Review of Systems as stated in this history (HPI) or in the review of systems. Otherwise all other 12 point ROS are negative  Past Medical History: 1. Total thyroidectomy -  10/07/2010 - [facility] T3N0 thyroid cancer 2. ADHD She takes Vyvanse when she works. 3. History of a laminectomy - 1996 4. Quit smoking April 2019.  Social History: Re-married 5 years ago. 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 They deal mainly with depression and bipolar.   Past Surgical History (Tanisha A. Owens Shark, [place]; 04/11/2018 3:14 PM) Breast Biopsy  Right. Thyroid Surgery   Diagnostic Studies History (Tanisha A. Owens Shark, [place]; 04/11/2018 3:14 PM) Colonoscopy  never Mammogram  within last year Pap Smear  1-5 years ago  Allergies (Tanisha A. Owens Shark, [place]; 04/11/2018 3:15 PM) Sulfa Antibiotics  Hives. Allergies Reconciled   Medication History (Tanisha A. Owens Shark, RMA; 04/11/2018 3:16 PM) Vyvanse (50MG  Capsule, Oral) Active. buPROPion HCl ER (XL) (300MG  Tablet ER 24HR, Oral) Active. Levothyroxine Sodium (150MCG Tablet, Oral) Active. Sertraline HCl (100MG  Tablet, Oral) Active. Medications Reconciled  Social History (Tanisha A. Owens Shark, [place]; 04/11/2018 3:14 PM) Caffeine use  Coffee. No alcohol use  No drug use  Tobacco use  Former smoker.  Family History (Tanisha A. Owens Shark, [place]; 04/11/2018 3:14 PM) Colon Polyps  Mother. Depression  Father, Mother. Diabetes Mellitus  Father. Heart disease in female family member before age 23  Kidney Disease  Mother. Migraine Headache  Mother.  Pregnancy / Birth History (Tanisha A. Owens Shark, [place]; 04/11/2018 3:14 PM) Age at menarche  16 years. Age of menopause  <45 Contraceptive History  Depo-provera, Oral contraceptives. Gravida  3 Irregular periods  Maternal age  27-25 Para  80  Other Problems (Tanisha A. Owens Shark, [place]; 04/11/2018  3:14 PM) Anxiety Disorder  Back Pain  Cholelithiasis  Depression  Hypercholesterolemia  Thyroid Cancer  Thyroid Disease   Review of Systems (Tanisha A. Brown RMA; 04/11/2018 3:14  PM) General Not Present- Appetite Loss, Chills, Fatigue, Fever, Night Sweats, Weight Gain and Weight Loss. Skin Not Present- Change in Wart/Mole, Dryness, Hives, Jaundice, New Lesions, Non-Healing Wounds, Rash and Ulcer. HEENT Not Present- Earache, Hearing Loss, Hoarseness, Nose Bleed, Oral Ulcers, Ringing in the Ears, Seasonal Allergies, Sinus Pain, Sore Throat, Visual Disturbances, Wears glasses/contact lenses and Yellow Eyes. Respiratory Present- Snoring. Not Present- Bloody sputum, Chronic Cough, Difficulty Breathing and Wheezing. Breast Not Present- Breast Mass, Breast Pain, Nipple Discharge and Skin Changes. Cardiovascular Not Present- Chest Pain, Difficulty Breathing Lying Down, Leg Cramps, Palpitations, Rapid Heart Rate, Shortness of Breath and Swelling of Extremities. Gastrointestinal Present- Bloating and Gets full quickly at meals. Not Present- Abdominal Pain, Bloody Stool, Change in Bowel Habits, Chronic diarrhea, Constipation, Difficulty Swallowing, Excessive gas, Hemorrhoids, Indigestion, Nausea, Rectal Pain and Vomiting. Female Genitourinary Not Present- Frequency, Nocturia, Painful Urination, Pelvic Pain and Urgency. Musculoskeletal Present- Joint Stiffness. Not Present- Back Pain, Joint Pain, Muscle Pain, Muscle Weakness and Swelling of Extremities. Neurological Not Present- Decreased Memory, Fainting, Headaches, Numbness, Seizures, Tingling, Tremor, Trouble walking and Weakness. Psychiatric Not Present- Anxiety, Bipolar, Change in Sleep Pattern, Depression, Fearful and Frequent crying. Endocrine Not Present- Cold Intolerance, Excessive Hunger, Hair Changes, Heat Intolerance, Hot flashes and New Diabetes. Hematology Not Present- Blood Thinners, Easy Bruising, Excessive bleeding, Gland problems, HIV and Persistent Infections.  Vitals (Tanisha A. Brown RMA; 04/11/2018 3:15 PM) 04/11/2018 3:14 PM Weight: 234.6 lb Height: 68in Body Surface Area: 2.19 m Body Mass Index: 35.67 kg/m   Temp.: 97.14F  Pulse: 101 (Regular)  BP: 132/86 (Sitting, Left Arm, Standard)   Physical Exam  General: Obese WN WF who is alert and generally healthy appearing. Skin: Inspection and palpation of the skin unremarkable.  Eyes: Conjunctivae white, pupils equal. Face, ears, nose, mouth, and throat: Face - normal. Normal ears and nose. Lips and teeth normal.  Neck: Supple. No mass. Trachea midline. Well healed thyroid scar. Lymph Nodes: No supraclavicular or cervical adenopathy. No axillary adenopathy.  Lungs: Normal respiratory effort. Clear to auscultation and symmetric breath sounds. Cardiovascular: Regular rate and rythm. Normal auscultation of the heart. No murmur or rub.  Abdomen: Soft. No mass. Liver and spleen not palpable. No hernia. Normal bowel sounds.   Infraumbilical scar. She really does not have any tenderness. Rectal: Not done.  Musculoskeletal/extremities: Normal gait. Good strength and ROM in upper and lower extremities.   Neurologic: Grossly intact to motor and sensory function.   Psychiatric: Has normal mood and affect. Judgement and insight appear normal.   Assessment & Plan  1.  CALCULUS OF GALLBLADDER WITH CHRONIC CHOLECYSTITIS WITHOUT OBSTRUCTION (K80.10)  Plan:  1) laparoscopic cholecystectomy with cholangiogram  2. Total thyroidectomy - 10/07/2010 - [facility]  T3N0 thyroid cancer 3. ADHD  She takes Vyvanse when she works. 4. History of a laminectomy - 1996 5. Quit smoking April 2019.    Alphonsa Overall, MD, [facility] Surgery Pager: [phone] Office phone:  [phone]

## 2018-04-15 ENCOUNTER — Ambulatory Visit (HOSPITAL_COMMUNITY): Payer: 59

## 2018-04-15 ENCOUNTER — Ambulatory Visit (HOSPITAL_COMMUNITY): Payer: 59 | Admitting: Anesthesiology

## 2018-04-15 ENCOUNTER — Ambulatory Visit (HOSPITAL_COMMUNITY)
Admission: RE | Admit: 2018-04-15 | Discharge: 2018-04-15 | Disposition: A | Discharge status: Home / Self Care | Payer: 59 | Attending: Surgery | Admitting: Surgery

## 2018-04-15 ENCOUNTER — Encounter (HOSPITAL_COMMUNITY)
Admission: RE | Disposition: A | Discharge status: Home / Self Care | Payer: Self-pay | Source: Home / Self Care | Attending: Surgery

## 2018-04-15 ENCOUNTER — Encounter (HOSPITAL_COMMUNITY): Payer: Self-pay

## 2018-04-15 DIAGNOSIS — Z79899 Other long term (current) drug therapy: Secondary | ICD-10-CM | POA: Insufficient documentation

## 2018-04-15 DIAGNOSIS — Z6835 Body mass index (BMI) 35.0-35.9, adult: Secondary | ICD-10-CM | POA: Insufficient documentation

## 2018-04-15 DIAGNOSIS — K8012 Calculus of gallbladder with acute and chronic cholecystitis without obstruction: Secondary | ICD-10-CM | POA: Diagnosis not present

## 2018-04-15 DIAGNOSIS — F329 Major depressive disorder, single episode, unspecified: Secondary | ICD-10-CM | POA: Insufficient documentation

## 2018-04-15 DIAGNOSIS — E119 Type 2 diabetes mellitus without complications: Secondary | ICD-10-CM | POA: Insufficient documentation

## 2018-04-15 DIAGNOSIS — Z419 Encounter for procedure for purposes other than remedying health state, unspecified: Secondary | ICD-10-CM

## 2018-04-15 DIAGNOSIS — Z87891 Personal history of nicotine dependence: Secondary | ICD-10-CM | POA: Insufficient documentation

## 2018-04-15 DIAGNOSIS — Z79890 Hormone replacement therapy (postmenopausal): Secondary | ICD-10-CM | POA: Insufficient documentation

## 2018-04-15 DIAGNOSIS — F419 Anxiety disorder, unspecified: Secondary | ICD-10-CM | POA: Insufficient documentation

## 2018-04-15 DIAGNOSIS — E039 Hypothyroidism, unspecified: Secondary | ICD-10-CM | POA: Insufficient documentation

## 2018-04-15 DIAGNOSIS — F909 Attention-deficit hyperactivity disorder, unspecified type: Secondary | ICD-10-CM | POA: Insufficient documentation

## 2018-04-15 DIAGNOSIS — E669 Obesity, unspecified: Secondary | ICD-10-CM | POA: Insufficient documentation

## 2018-04-15 DIAGNOSIS — Z85850 Personal history of malignant neoplasm of thyroid: Secondary | ICD-10-CM | POA: Insufficient documentation

## 2018-04-15 HISTORY — PX: CHOLECYSTECTOMY: SHX55

## 2018-04-15 SURGERY — LAPAROSCOPIC CHOLECYSTECTOMY WITH INTRAOPERATIVE CHOLANGIOGRAM
Anesthesia: General | Site: Abdomen

## 2018-04-15 MED ORDER — BUPIVACAINE-EPINEPHRINE (PF) 0.25% -1:200000 IJ SOLN
INTRAMUSCULAR | Status: AC
  Filled 2018-04-15: qty 30

## 2018-04-15 MED ORDER — PROPOFOL 10 MG/ML IV BOLUS
INTRAVENOUS | Status: DC | PRN
  Administered 2018-04-15: 200 mg via INTRAVENOUS

## 2018-04-15 MED ORDER — DEXAMETHASONE SODIUM PHOSPHATE 10 MG/ML IJ SOLN
INTRAMUSCULAR | Status: DC | PRN
  Administered 2018-04-15: 5 mg via INTRAVENOUS

## 2018-04-15 MED ORDER — PROMETHAZINE HCL 25 MG/ML IJ SOLN: 6.2500 mg | INTRAMUSCULAR | Status: DC | PRN

## 2018-04-15 MED ORDER — PROPOFOL 10 MG/ML IV BOLUS
INTRAVENOUS | Status: AC
  Filled 2018-04-15: qty 20

## 2018-04-15 MED ORDER — ONDANSETRON HCL 4 MG/2ML IJ SOLN
INTRAMUSCULAR | Status: AC
  Filled 2018-04-15: qty 2

## 2018-04-15 MED ORDER — OXYCODONE HCL 5 MG PO TABS: 5.0000 mg | ORAL_TABLET | Freq: Once | ORAL | Status: DC | PRN

## 2018-04-15 MED ORDER — SUGAMMADEX SODIUM 200 MG/2ML IV SOLN
INTRAVENOUS | Status: AC
  Filled 2018-04-15: qty 2

## 2018-04-15 MED ORDER — CEFAZOLIN SODIUM-DEXTROSE 2-4 GM/100ML-% IV SOLN
2.0000 g | INTRAVENOUS | Status: AC
  Administered 2018-04-15: 2 g via INTRAVENOUS
  Filled 2018-04-15: qty 100

## 2018-04-15 MED ORDER — ROCURONIUM BROMIDE 100 MG/10ML IV SOLN
INTRAVENOUS | Status: AC
  Filled 2018-04-15: qty 1

## 2018-04-15 MED ORDER — MIDAZOLAM HCL 2 MG/2ML IJ SOLN
INTRAMUSCULAR | Status: AC
  Filled 2018-04-15: qty 2

## 2018-04-15 MED ORDER — EPHEDRINE SULFATE 50 MG/ML IJ SOLN
INTRAMUSCULAR | Status: DC | PRN
  Administered 2018-04-15: 15 mg via INTRAVENOUS

## 2018-04-15 MED ORDER — SUGAMMADEX SODIUM 200 MG/2ML IV SOLN
INTRAVENOUS | Status: DC | PRN
  Administered 2018-04-15: 200 mg via INTRAVENOUS

## 2018-04-15 MED ORDER — LACTATED RINGERS IR SOLN
Status: DC | PRN
  Administered 2018-04-15: 1000 mL

## 2018-04-15 MED ORDER — CHLORHEXIDINE GLUCONATE CLOTH 2 % EX PADS: 6.0000 | MEDICATED_PAD | Freq: Once | CUTANEOUS | Status: DC

## 2018-04-15 MED ORDER — ACETAMINOPHEN 10 MG/ML IV SOLN: 1000.0000 mg | Freq: Once | INTRAVENOUS | Status: DC | PRN

## 2018-04-15 MED ORDER — BUPIVACAINE-EPINEPHRINE 0.25% -1:200000 IJ SOLN
INTRAMUSCULAR | Status: DC | PRN
  Administered 2018-04-15: 30 mL

## 2018-04-15 MED ORDER — HYDROCODONE-ACETAMINOPHEN 5-325 MG PO TABS: 1.0000 | ORAL_TABLET | Freq: Four times a day (QID) | ORAL | 0 refills | Status: DC | PRN

## 2018-04-15 MED ORDER — OXYCODONE HCL 5 MG/5ML PO SOLN
5.0000 mg | Freq: Once | ORAL | Status: DC | PRN
  Filled 2018-04-15: qty 5

## 2018-04-15 MED ORDER — MIDAZOLAM HCL 5 MG/5ML IJ SOLN
INTRAMUSCULAR | Status: DC | PRN
  Administered 2018-04-15 (×2): 1 mg via INTRAVENOUS

## 2018-04-15 MED ORDER — IOPAMIDOL (ISOVUE-300) INJECTION 61%
INTRAVENOUS | Status: AC
  Filled 2018-04-15: qty 50

## 2018-04-15 MED ORDER — FENTANYL CITRATE (PF) 250 MCG/5ML IJ SOLN
INTRAMUSCULAR | Status: AC
  Filled 2018-04-15: qty 5

## 2018-04-15 MED ORDER — LIDOCAINE 2% (20 MG/ML) 5 ML SYRINGE
INTRAMUSCULAR | Status: AC
  Filled 2018-04-15: qty 5

## 2018-04-15 MED ORDER — LACTATED RINGERS IV SOLN
INTRAVENOUS | Status: DC
  Administered 2018-04-15 (×2): via INTRAVENOUS

## 2018-04-15 MED ORDER — FENTANYL CITRATE (PF) 100 MCG/2ML IJ SOLN
INTRAMUSCULAR | Status: DC | PRN
  Administered 2018-04-15 (×2): 50 ug via INTRAVENOUS
  Administered 2018-04-15: 100 ug via INTRAVENOUS
  Administered 2018-04-15: 50 ug via INTRAVENOUS

## 2018-04-15 MED ORDER — ONDANSETRON HCL 4 MG/2ML IJ SOLN
INTRAMUSCULAR | Status: DC | PRN
  Administered 2018-04-15: 4 mg via INTRAVENOUS

## 2018-04-15 MED ORDER — 0.9 % SODIUM CHLORIDE (POUR BTL) OPTIME
TOPICAL | Status: DC | PRN
  Administered 2018-04-15: 1000 mL

## 2018-04-15 MED ORDER — LIDOCAINE HCL (CARDIAC) PF 100 MG/5ML IV SOSY
PREFILLED_SYRINGE | INTRAVENOUS | Status: DC | PRN
  Administered 2018-04-15: 30 mg via INTRAVENOUS

## 2018-04-15 MED ORDER — DEXAMETHASONE SODIUM PHOSPHATE 10 MG/ML IJ SOLN
INTRAMUSCULAR | Status: AC
  Filled 2018-04-15: qty 1

## 2018-04-15 MED ORDER — IOPAMIDOL (ISOVUE-300) INJECTION 61%
INTRAVENOUS | Status: DC | PRN
  Administered 2018-04-15: 2.5 mL via INTRAVENOUS

## 2018-04-15 MED ORDER — ROCURONIUM BROMIDE 100 MG/10ML IV SOLN
INTRAVENOUS | Status: DC | PRN
  Administered 2018-04-15: 50 mg via INTRAVENOUS

## 2018-04-15 MED ORDER — GABAPENTIN 300 MG PO CAPS
300.0000 mg | ORAL_CAPSULE | ORAL | Status: AC
  Administered 2018-04-15: 300 mg via ORAL
  Filled 2018-04-15: qty 1

## 2018-04-15 MED ORDER — FENTANYL CITRATE (PF) 100 MCG/2ML IJ SOLN: 25.0000 ug | INTRAMUSCULAR | Status: DC | PRN

## 2018-04-15 MED ORDER — ACETAMINOPHEN 500 MG PO TABS
1000.0000 mg | ORAL_TABLET | ORAL | Status: AC
  Administered 2018-04-15: 1000 mg via ORAL
  Filled 2018-04-15: qty 2

## 2018-04-15 SURGICAL SUPPLY — 38 items
APPLIER CLIP 5 13 M/L LIGAMAX5 (MISCELLANEOUS)
APPLIER CLIP ROT 10 11.4 M/L (STAPLE) ×2
CABLE HIGH FREQUENCY MONO STRZ (ELECTRODE) ×2 IMPLANT
CHLORAPREP W/TINT 26ML (MISCELLANEOUS) ×2 IMPLANT
CHOLANGIOGRAM CATH TAUT (CATHETERS) ×2 IMPLANT
CLIP APPLIE 5 13 M/L LIGAMAX5 (MISCELLANEOUS) IMPLANT
CLIP APPLIE ROT 10 11.4 M/L (STAPLE) ×1 IMPLANT
COVER MAYO STAND STRL (DRAPES) ×2 IMPLANT
COVER SURGICAL LIGHT HANDLE (MISCELLANEOUS) ×2 IMPLANT
COVER WAND RF STERILE (DRAPES) ×2 IMPLANT
DECANTER SPIKE VIAL GLASS SM (MISCELLANEOUS) IMPLANT
DERMABOND ADVANCED (GAUZE/BANDAGES/DRESSINGS) ×1
DERMABOND ADVANCED .7 DNX12 (GAUZE/BANDAGES/DRESSINGS) ×1 IMPLANT
DRAPE C-ARM 42X120 X-RAY (DRAPES) ×2 IMPLANT
ELECT REM PT RETURN 15FT ADLT (MISCELLANEOUS) ×2 IMPLANT
GLOVE SURG SIGNA 7.5 PF LTX (GLOVE) ×2 IMPLANT
GOWN STRL REUS W/TWL XL LVL3 (GOWN DISPOSABLE) ×6 IMPLANT
HEMOSTAT SURGICEL 4X8 (HEMOSTASIS) ×2 IMPLANT
IV CATH 14GX2 1/4 (CATHETERS) ×2 IMPLANT
IV SET EXTENSION CATH 6 NF (IV SETS) ×2 IMPLANT
KIT BASIN OR (CUSTOM PROCEDURE TRAY) ×2 IMPLANT
POUCH RETRIEVAL ECOSAC 10 (ENDOMECHANICALS) ×1 IMPLANT
POUCH RETRIEVAL ECOSAC 10MM (ENDOMECHANICALS) ×1
SCISSORS LAP 5X35 DISP (ENDOMECHANICALS) ×2 IMPLANT
SET IRRIG TUBING LAPAROSCOPIC (IRRIGATION / IRRIGATOR) ×2 IMPLANT
SET TUBE SMOKE EVAC HIGH FLOW (TUBING) ×2 IMPLANT
SLEEVE ADV FIXATION 5X100MM (TROCAR) ×2 IMPLANT
STOPCOCK 4 WAY LG BORE MALE ST (IV SETS) ×2 IMPLANT
STRIP CLOSURE SKIN 1/4X4 (GAUZE/BANDAGES/DRESSINGS) IMPLANT
SUT MNCRL AB 4-0 PS2 18 (SUTURE) ×2 IMPLANT
SUT VICRYL 0 UR6 27IN ABS (SUTURE) ×2 IMPLANT
SYR 10ML ECCENTRIC (SYRINGE) ×2 IMPLANT
TOWEL OR 17X26 10 PK STRL BLUE (TOWEL DISPOSABLE) ×2 IMPLANT
TOWEL OR NON WOVEN STRL DISP B (DISPOSABLE) IMPLANT
TRAY LAPAROSCOPIC (CUSTOM PROCEDURE TRAY) ×2 IMPLANT
TROCAR ADV FIXATION 11X100MM (TROCAR) ×2 IMPLANT
TROCAR ADV FIXATION 5X100MM (TROCAR) ×2 IMPLANT
TROCAR XCEL BLUNT TIP 100MML (ENDOMECHANICALS) ×2 IMPLANT

## 2018-04-15 NOTE — Discharge Instructions (Signed)
[facility] - DISCHARGE INSTRUCTIONS TO PATIENT   Activity:  Driving - May drive in 2 to 4 days, if doing well and off pain meds.   Lifting - No lifting more than 15 pounds for 10 days.  Wound Care:   Leave the incisions dry for 2 days, then you may shower.  There is glue on the skin, but it can be washed with soap and water.  Diet:  As tolerated.  Follow up appointment:  Call Dr. Pollie Friar office [facility]) at [phone] for an appointment in 2 to 3 weeks.  Medications and dosages:  Resume your home medications.  You have a prescription for:  Vicodin  Call Dr. Lucia Gaskins or his office  [phone]) if you have:  Temperature greater than 100.4,  Persistent nausea and vomiting,  Severe uncontrolled pain,  Redness, tenderness, or signs of infection (pain, swelling, redness, odor or green/yellow discharge around the site),  Difficulty breathing, headache or visual disturbances,  Any other questions or concerns you may have after discharge.  In an emergency, call 911 or go to an Emergency Department at a nearby hospital.

## 2018-04-15 NOTE — Interval H&P Note (Signed)
History and Physical Interval Note:  04/15/2018 8:58 AM  Taylor Powers  has presented today for surgery, with the diagnosis of cholecystitis, cholelithiasis  The various methods of treatment have been discussed with the patient and family.   Wife, Shirlean Mylar, at bedside.  After consideration of risks, benefits and other options for treatment, the patient has consented to  Procedure(s): LAPAROSCOPIC CHOLECYSTECTOMY WITH INTRAOPERATIVE CHOLANGIOGRAM (N/A) as a surgical intervention .  The patient's history has been reviewed, patient examined, no change in status, stable for surgery.  I have reviewed the patient's chart and labs.  Questions were answered to the patient's satisfaction.     Shann Medal

## 2018-04-15 NOTE — Transfer of Care (Signed)
Immediate Anesthesia Transfer of Care Note  Patient: Taylor Powers  Procedure(s) Performed: LAPAROSCOPIC CHOLECYSTECTOMY WITH INTRAOPERATIVE CHOLANGIOGRAM (N/A Abdomen)  Patient Location: PACU  Anesthesia Type:General  Level of Consciousness: awake, alert , oriented and patient cooperative  Airway & Oxygen Therapy: Patient Spontanous Breathing and Patient connected to face mask oxygen  Post-op Assessment: Report given to RN and Post -op Vital signs reviewed and stable  Post vital signs: Reviewed and stable  Last Vitals:  Vitals Value Taken Time  BP 137/76 04/15/2018 11:26 AM  Temp    Pulse 100 04/15/2018 11:28 AM  Resp 18 04/15/2018 11:28 AM  SpO2 100 % 04/15/2018 11:28 AM  Vitals shown include unvalidated device data.  Last Pain:  Vitals:   04/15/18 0805  TempSrc:   PainSc: 0-No pain         Complications: No apparent anesthesia complications

## 2018-04-15 NOTE — Op Note (Signed)
04/15/2018  11:15 AM  PATIENT:  Taylor Powers, 48 y.o., female, MRN: [id number]  PREOP DIAGNOSIS:  cholecystitis, cholelithiasis  POSTOP DIAGNOSIS:   Acute/chronic cholecystitis, cholelithiasis  PROCEDURE:   Procedure(s):  LAPAROSCOPIC CHOLECYSTECTOMY WITH INTRAOPERATIVE CHOLANGIOGRAM  SURGEON:   Alphonsa Overall, M.D.  Terrence DupontSherrill Raring, M.D.  ANESTHESIA:   general  Anesthesiologist: Lillia Abed, MD CRNA: Victoriano Lain, CRNA; Flynn-Cook, Kerrie Buffalo, CRNA  General  ASA: 2  EBL:  minimal  ml  BLOOD ADMINISTERED: none  DRAINS: none   LOCAL MEDICATIONS USED:   30 cc of 1/4% marcaine  SPECIMEN:   Gall bladder  COUNTS CORRECT:  YES  INDICATIONS FOR PROCEDURE:  Taylor Powers is a 48 y.o. (DOB: [birth year]) white female whose primary care physician is Sagardia, Ines Bloomer, MD and comes for cholecystectomy.   The indications and risks of the gall bladder surgery were explained to the patient.  The risks include, but are not limited to, infection, bleeding, common bile duct injury and open surgery.  SURGERY:  The patient was taken to OR room #1 at [facility].  The abdomen was prepped with chloroprep.  The patient was given 2 gm Ancef at the beginning of the operation.   A time out was held and the surgical checklist run.   An infraumbilical incision was made into the abdominal cavity.  A 12 mm Hasson trocar was inserted into the abdominal cavity through the infraumbilical incision and secured with a 0 Vicryl suture.  Three additional trocars were inserted: a 10 mm trocar in the sub-xiphoid location, a 5 mm trocar in the right mid subcostal area, and a 5 mm trocar in the right lateral subcostal area.   The abdomen was explored and the liver, stomach, and bowel that could be seen were unremarkable.   The gall bladder was acutely and chronically inflamed.   There was omentum stuck over the gall bladder and it had some mild purulence about mid body.  I had to  decompress the gall bladder - there was "white" bile.  I grasped the gall bladder and rotated it cephalad.  Disssection was carried down to the gall bladder/cystic duct junction and the cystic duct isolated.  A clip was placed on the gall bladder side of the cystic duct.   An intra-operative cholangiogram was shot.   The intra-operative cholangiogram was shot using a cut off Taut catheter placed through a 14 gauge angiocath in the RUQ.  The Taut catheter was inserted in the cut cystic duct and secured with an endoclip.  A cholangiogram was shot with 8 cc of 1/2 strength Isoview.  Using fluoroscopy, the cholangiogram showed the flow of contrast into the common bile duct, up the hepatic radicals, and into the duodenum.  There was no mass or obstruction.  This was a normal intra-operative cholangiogram.   The Taut catheter was removed.  The cystic duct was tripley endoclipped and the cystic artery was identified and clipped.  The gall bladder was bluntly and sharpley dissected from the gall bladder bed.  The gall bladder rind was thick and the gall bladder intrahepatic.   After the gall bladder was removed from the liver, the gall bladder bed and Triangle of [place] were inspected.  There was no bleeding or bile leak. I did place Surgicel in the gall bladder fossa.  The gall bladder was placed in a Ecco Sac bag and delivered through the umbilicus.  The abdomen was irrigated with 1,300 cc saline.  The trocars were then removed.  I infiltrated 30cc of 1/4% Marcaine into the incisions.  The umbilical port closed with a 0 Vicryl suture and the skin closed with 4-0 Monocryl.  The skin was painted with DermaBond.  The patient's sponge and needle count were correct.  The patient was transported to the RR in good condition.  Alphonsa Overall, MD, [facility] Surgery Pager: [phone] Office phone:  [phone]

## 2018-04-15 NOTE — Anesthesia Postprocedure Evaluation (Signed)
Anesthesia Post Note  Patient: Taylor Powers  Procedure(s) Performed: LAPAROSCOPIC CHOLECYSTECTOMY WITH INTRAOPERATIVE CHOLANGIOGRAM (N/A Abdomen)     Patient location during evaluation: PACU Anesthesia Type: General Level of consciousness: awake and alert Pain management: pain level controlled Vital Signs Assessment: post-procedure vital signs reviewed and stable Respiratory status: spontaneous breathing, nonlabored ventilation, respiratory function stable and patient connected to nasal cannula oxygen Cardiovascular status: blood pressure returned to baseline and stable Postop Assessment: no apparent nausea or vomiting Anesthetic complications: no    Last Vitals:  Vitals:   04/15/18 1311 04/15/18 1325  BP: 134/84 133/89  Pulse: 81 92  Resp: 12 16  Temp: 37 C 37.1 C  SpO2: 98% 98%    Last Pain:  Vitals:   04/15/18 1325  TempSrc:   PainSc: 5                  Bettie Swavely DAVID

## 2018-04-15 NOTE — Anesthesia Procedure Notes (Signed)
Procedure Name: Intubation Date/Time: 04/15/2018 9:37 AM Performed by: Garrel Ridgel, CRNA Pre-anesthesia Checklist: Patient identified, Emergency Drugs available, Suction available, Timeout performed and Patient being monitored Patient Re-evaluated:Patient Re-evaluated prior to induction Oxygen Delivery Method: Circle system utilized Preoxygenation: Pre-oxygenation with 100% oxygen Induction Type: IV induction Ventilation: Mask ventilation without difficulty Grade View: Grade II Tube type: Oral Tube size: 7.0 mm Number of attempts: 1 Airway Equipment and Method: Stylet Secured at: 21 cm Tube secured with: Tape Dental Injury: Teeth and Oropharynx as per pre-operative assessment

## 2018-04-16 ENCOUNTER — Encounter (HOSPITAL_COMMUNITY): Payer: Self-pay | Admitting: Surgery

## 2018-05-28 ENCOUNTER — Other Ambulatory Visit: Payer: Self-pay | Admitting: Emergency Medicine

## 2018-05-28 MED ORDER — LEVOTHYROXINE SODIUM 150 MCG PO TABS: 150.0000 ug | ORAL_TABLET | Freq: Every day | ORAL | 0 refills | Status: DC

## 2018-05-28 NOTE — Telephone Encounter (Signed)
Copied from [place] [phone]. Topic: Quick Communication - Rx Refill/Question >> May 28, 2018  4:02 PM Virl Axe D wrote: Medication: levothyroxine (SYNTHROID, LEVOTHROID) 150 MCG tablet / Pharmacy stated they sent over 2 electronic refill requests with no result. Informed pharmacy no requests have been received.  Has the patient contacted their pharmacy? Yes.   (Agent: If no, request that the patient contact the pharmacy for the refill.) (Agent: If yes, when and what did the pharmacy advise?)  Preferred Pharmacy (with phone number or street name): [id number] - [place], [place] - [address] [phone] (Phone) [phone] (Fax)  Agent: Please be advised that RX refills may take up to 3 business days. We ask that you follow-up with your pharmacy.

## 2018-05-28 NOTE — Telephone Encounter (Signed)
Requested Prescriptions  Pending Prescriptions Disp Refills  . levothyroxine (SYNTHROID, LEVOTHROID) 150 MCG tablet 90 tablet 0    Sig: Take 1 tablet (150 mcg total) by mouth daily before breakfast.     Endocrinology:  Hypothyroid Agents Failed - 05/28/2018  4:06 PM      Failed - TSH needs to be rechecked within 3 months after an abnormal result. Refill until TSH is due.      Passed - TSH in normal range and within 360 days    TSH  Date Value Ref Range Status  07/07/2017 1.270 0.450 - 4.500 uIU/mL Final         Passed - Valid encounter within last 12 months    Recent Outpatient Visits          1 month ago Upper abdominal pain   Primary Care at [facility], [place], MD   7 months ago Glucose intolerance (impaired glucose tolerance)   Primary Care at [facility], Renette Butters, MD   10 months ago Routine physical examination   Primary Care at [facility], Renette Butters, MD   1 year ago Depression with anxiety   Primary Care at [facility], Renette Butters, MD   1 year ago Visit for TB skin test   Primary Care at [facility], [place] D, PA-C           pt. Is due for CPE after 07/08/18; gave 90 day supply; no refills; called pt. And left vm to call to schedule physcial

## 2018-06-13 ENCOUNTER — Telehealth: Payer: Self-pay | Admitting: Psychiatry

## 2018-06-13 ENCOUNTER — Other Ambulatory Visit: Payer: Self-pay | Admitting: Psychiatry

## 2018-06-13 MED ORDER — LISDEXAMFETAMINE DIMESYLATE 50 MG PO CAPS: 50.0000 mg | ORAL_CAPSULE | Freq: Every day | ORAL | 0 refills | Status: DC

## 2018-06-13 NOTE — Telephone Encounter (Signed)
Patient called and said that she needs a refill of her vyvanse 50 mg to be escrib[facility] [address]. Her next appt is in april

## 2018-06-14 ENCOUNTER — Other Ambulatory Visit: Payer: Self-pay

## 2018-06-14 ENCOUNTER — Telehealth: Payer: Self-pay

## 2018-06-14 MED ORDER — BUPROPION HCL ER (XL) 300 MG PO TB24: 300.0000 mg | ORAL_TABLET | Freq: Every day | ORAL | 1 refills | Status: DC

## 2018-06-14 NOTE — Telephone Encounter (Signed)
Prior authorization submitted for Vyvanse 50mg  1/day through [employer] approved effective 06/14/2018-06/14/2019 CQ#[id number]

## 2018-06-25 ENCOUNTER — Ambulatory Visit: Payer: Managed Care, Other (non HMO) | Admitting: Psychiatry

## 2018-08-22 ENCOUNTER — Other Ambulatory Visit: Payer: Self-pay

## 2018-08-22 ENCOUNTER — Encounter: Payer: Self-pay | Admitting: Psychiatry

## 2018-08-22 ENCOUNTER — Ambulatory Visit (INDEPENDENT_AMBULATORY_CARE_PROVIDER_SITE_OTHER): Payer: 59 | Admitting: Psychiatry

## 2018-08-22 DIAGNOSIS — F3342 Major depressive disorder, recurrent, in full remission: Secondary | ICD-10-CM

## 2018-08-22 DIAGNOSIS — F900 Attention-deficit hyperactivity disorder, predominantly inattentive type: Secondary | ICD-10-CM | POA: Diagnosis not present

## 2018-08-22 MED ORDER — BUPROPION HCL ER (XL) 300 MG PO TB24: 300.0000 mg | ORAL_TABLET | Freq: Every day | ORAL | 1 refills | Status: DC

## 2018-08-22 MED ORDER — LISDEXAMFETAMINE DIMESYLATE 50 MG PO CAPS: 50.0000 mg | ORAL_CAPSULE | Freq: Every day | ORAL | 0 refills | Status: DC

## 2018-08-22 MED ORDER — SERTRALINE HCL 100 MG PO TABS: 100.0000 mg | ORAL_TABLET | Freq: Every day | ORAL | 1 refills | Status: DC

## 2018-08-22 NOTE — Progress Notes (Signed)
Taylor Powers [id number] 1970/12/13 48 y.o.  Virtual Visit via Telephone Note  I connected with@ on 08/22/18 at  1:30 PM EDT by telephone and verified that I am speaking with the correct person using two identifiers.   I discussed the limitations, risks, security and privacy concerns of performing an evaluation and management service by telephone and the availability of in person appointments. I also discussed with the patient that there may be a patient responsible charge related to this service. The patient expressed understanding and agreed to proceed.   I discussed the assessment and treatment plan with the patient. The patient was provided an opportunity to ask questions and all were answered. The patient agreed with the plan and demonstrated an understanding of the instructions.   The patient was advised to call back or seek an in-person evaluation if the symptoms worsen or if the condition fails to improve as anticipated.  I provided 30 minutes of non-face-to-face time during this encounter.  The patient was located at home.  The provider was located at [facility].   Thayer Headings, PMHNP   Subjective:   Patient ID:  Taylor Powers is a 48 y.o. (DOB Feb 28, 1971) female.  Chief Complaint:  Chief Complaint  Patient presents with  . Follow-up    h/o ADD, Depression, and Anxiety    HPI Taylor Powers presents for follow-up of depression. Denies depressed or irritable mood. Denies anxiety. She reports adequate sleep. She reports slight decrease in appetite. She reports that Vyvanse is effective for her concentration. She reports that Vyvanse seems to be effective for 6-8 hours. She reports that she takes Vyvanse about 4 days a week. She reports that her energy and motivation have improved since completing her master's program. Denies anhedonia. Denies SI.   She reports that she has been Wellbutrin and Zoloft for 13 years and this has been very helpful. Reports that she had  depression in the past. Also had some anxiety that manifested as irritability.   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 Has 3 grown children. Works 3.5- 4 days a week. Enjoys crafting, travel, and camping.   Past Psychiatric Medication Trials: Lexapro- Took after her divorce Wellbutrin XL Vyvanse- Effective Sertraline- 100 mg is effective. Noticed some affective dulling at 150 mg    Review of Systems:  Review of Systems  Cardiovascular: Negative for palpitations.  Musculoskeletal: Negative for gait problem.  Neurological: Negative for tremors.  Psychiatric/Behavioral:       Please refer to HPI   Reports that she had a cholecystectomy in January.    Medications: I have reviewed the patient's current medications.  Current Outpatient Medications  Medication Sig Dispense Refill  . buPROPion (WELLBUTRIN XL) 300 MG 24 hr tablet Take 1 tablet (300 mg total) by mouth daily. 90 tablet 1  . calcium-vitamin D (OSCAL WITH D) 250-125 MG-UNIT tablet Take 1 tablet by mouth daily.    Marland Kitchen ibuprofen (ADVIL,MOTRIN) 200 MG tablet Take 600 mg by mouth daily as needed for headache.    . lisdexamfetamine (VYVANSE) 50 MG capsule Take 1 capsule (50 mg total) by mouth daily. 30 capsule 0  . Multiple Vitamin (MULTIVITAMIN WITH MINERALS) TABS tablet Take 1 tablet by mouth daily.    . Omega-3 Fatty Acids (FISH OIL) 1000 MG CAPS Take 1,000 mg by mouth 2 (two) times daily.    . sertraline (ZOLOFT) 100 MG tablet Take 1 tablet (100 mg total) by mouth daily. 90 tablet 1  . levothyroxine (SYNTHROID) 150 MCG tablet TAKE  1 TABLET (150 MCG TOTAL) BY MOUTH DAILY BEFORE BREAKFAST. 30 tablet 0  . [START ON 10/17/2018] lisdexamfetamine (VYVANSE) 50 MG capsule Take 1 capsule (50 mg total) by mouth daily for 30 days. 30 capsule 0  . [START ON 09/19/2018] lisdexamfetamine (VYVANSE) 50 MG capsule Take 1 capsule (50 mg total) by mouth daily for 30 days. 30 capsule 0  . metaxalone (SKELAXIN) 800 MG tablet Take 1 tablet (800 mg total)  by mouth 3 (three) times daily as needed for muscle spasms. (Patient not taking: Reported on 08/22/2018) 60 tablet 2   No current facility-administered medications for this visit.     Medication Side Effects: None  Allergies:  Allergies  Allergen Reactions  . Sulfa Antibiotics Hives    All over body    Past Medical History:  Diagnosis Date  . Absence of menstruation   . Allergic rhinitis, cause unspecified   . Anxiety state, unspecified   . Arthritis    DDD L4-5; ruptured disc; laminectomy.  . Cancer ([place]) 09/18/2010   thyroid cancer; s/p thyroidectomy total; s/p ablation.  [place].  . Chicken pox   . Diabetes mellitus   . Dysplasia of cervix, unspecified   . Dysthymic disorder   . Hematuria, unspecified   . Hypersomnia, unspecified    night shift work  . Insomnia, unspecified   . Mixed hyperlipidemia   . Other abnormal glucose   . Other acne   . Thyroid disease   . Tobacco use disorder   . Unspecified vitamin D deficiency     Family History  Problem Relation Age of Onset  . Depression Mother   . Kidney disease Mother        R Nephrectomy kidney stones  . Irritable bowel syndrome Mother   . Diabetes Father   . Depression Father   . Cancer Maternal Grandfather   . Bipolar disorder Son   . ADD / ADHD Son   . Hypertension Paternal Grandmother   . ADD / ADHD Brother   . ADD / ADHD Brother     Social History   Socioeconomic History  . Marital status: Married    Spouse name: Dawayne Cirri  . Number of children: 3  . Years of education: Not on file  . Highest education level: Not on file  Occupational History  . Occupation: Psych ED    Employer: [employer]  . Occupation: Programmer, multimedia: [employer]  . Financial resource strain: Not on file  . Food insecurity:    Worry: Not on file    Inability: Not on file  . Transportation needs:    Medical: Not on file    Non-medical: Not on file  Tobacco Use  . Smoking status: Former Smoker     Packs/day: 0.50    Years: 27.00    Pack years: 13.50    Types: Cigarettes  . Smokeless tobacco: Never Used  Substance and Sexual Activity  . Alcohol use: No    Alcohol/week: 0.0 standard drinks  . Drug use: No  . Sexual activity: Yes    Comment: same sexual partner  Lifestyle  . Physical activity:    Days per week: Not on file    Minutes per session: Not on file  . Stress: Not on file  Relationships  . Social connections:    Talks on phone: Not on file    Gets together: Not on file    Attends religious service: Not on file  Active member of club or organization: Not on file    Attends meetings of clubs or organizations: Not on file    Relationship status: Not on file  . Intimate partner violence:    Fear of current or ex partner: Not on file    Emotionally abused: Not on file    Physically abused: Not on file    Forced sexual activity: Not on file  Other Topics Concern  . Not on file  Social History Narrative   Marital status:  Married same sex partner 12/2013 Dawayne Cirri. Happy, no abuse.      Children:  3 children (24, 22, 20); 3 grandchildren.        Lives: with wife.      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  Two sixteen hour shifts followed by 8 hour shift; weekends.  Off Mon-Thurs.  [place] job.  Graduate school in 2019.      Tobacco: 1 ppd x 30 years.        Alcohol: none; partner/wife in recovery.      Drugs: none      Exercise: none      Caffeine use: diet Pepsi and coffee.      Always uses seat belts.       Smoke alarm and carbon monoxide detector in the home.       No guns.     Past Medical History, Surgical history, Social history, and Family history were reviewed and updated as appropriate.   Please see review of systems for further details on the patient's review from today.   Objective:   Physical Exam:  LMP 03/05/2013   Physical Exam Neurological:     Mental Status: She is alert and oriented  to person, place, and time.     Cranial Nerves: No dysarthria.  Psychiatric:        Attention and Perception: Attention normal.        Mood and Affect: Mood normal.        Speech: Speech normal.        Behavior: Behavior is cooperative.        Thought Content: Thought content normal. Thought content is not paranoid or delusional. Thought content does not include homicidal or suicidal ideation. Thought content does not include homicidal or suicidal plan.        Cognition and Memory: Cognition and memory normal.        Judgment: Judgment normal.     Lab Review:     Component Value Date/Time   NA 137 04/09/2018 1254   K 3.9 04/09/2018 1254   CL 98 04/09/2018 1254   CO2 21 04/09/2018 1254   GLUCOSE 79 04/09/2018 1254   GLUCOSE 102 (H) 11/24/2015 1519   BUN 15 04/09/2018 1254   CREATININE 0.99 04/09/2018 1254   CREATININE 0.77 11/24/2015 1519   CALCIUM 9.7 04/09/2018 1254   PROT 7.3 04/09/2018 1254   ALBUMIN 4.5 04/09/2018 1254   AST 19 04/09/2018 1254   ALT 31 04/09/2018 1254   ALKPHOS 114 04/09/2018 1254   BILITOT 0.5 04/09/2018 1254   GFRNONAA 68 04/09/2018 1254   GFRNONAA >89 06/17/2013 0909   GFRAA 78 04/09/2018 1254   GFRAA >89 06/17/2013 0909       Component Value Date/Time   WBC 9.7 04/09/2018 1254   WBC 5.7 11/24/2015 1532   WBC 6.7 10/14/2014 0820   RBC 4.63 04/09/2018 1254   RBC 4.26 11/24/2015 1532  RBC 4.63 10/14/2014 0820   HGB 13.3 04/09/2018 1254   HCT 39.9 04/09/2018 1254   PLT 238 04/09/2018 1254   MCV 86 04/09/2018 1254   MCH 28.7 04/09/2018 1254   MCH 30.0 11/24/2015 1532   MCH 28.5 10/14/2014 0820   MCHC 33.3 04/09/2018 1254   MCHC 35.4 11/24/2015 1532   MCHC 34.1 10/14/2014 0820   RDW 12.1 04/09/2018 1254   LYMPHSABS 2.5 04/09/2018 1254   MONOABS 0.4 10/14/2014 0820   EOSABS 0.1 04/09/2018 1254   BASOSABS 0.1 04/09/2018 1254    No results found for: POCLITH, LITHIUM   No results found for: PHENYTOIN, PHENOBARB, VALPROATE, CBMZ    .res Assessment: Plan:   Continue Wellbutrin XL 300 mg po qd for depression. Continue Vyvanse 50 mg po q am for ADD. Continue Sertraline 100 mg po qd for anxiety and depression.  Pt to f/u in 3 months or sooner if clinically indicated.   Attention deficit hyperactivity disorder (ADHD), predominantly inattentive type - Plan: lisdexamfetamine (VYVANSE) 50 MG capsule, lisdexamfetamine (VYVANSE) 50 MG capsule, lisdexamfetamine (VYVANSE) 50 MG capsule  Recurrent major depressive disorder, in full remission ([place]) - Plan: buPROPion (WELLBUTRIN XL) 300 MG 24 hr tablet, sertraline (ZOLOFT) 100 MG tablet  Please see After Visit Summary for patient specific instructions.  No future appointments.  No orders of the defined types were placed in this encounter.     -------------------------------

## 2018-08-23 ENCOUNTER — Other Ambulatory Visit: Payer: Self-pay | Admitting: Emergency Medicine

## 2018-08-23 NOTE — Telephone Encounter (Signed)
Refilled for 30 days, no refills until appointment is made.

## 2018-08-23 NOTE — Telephone Encounter (Signed)
Requested medication (s) are due for refill today: yes  Requested medication (s) are on the active medication list: yes  Last refill:  05/28/18  Future visit scheduled: No  Notes to clinic:  Unable to refill per protocol. Last TSH level drawn on 07/07/17    Requested Prescriptions  Pending Prescriptions Disp Refills   levothyroxine (SYNTHROID) 150 MCG tablet [Pharmacy Med Name: LEVOTHYROXINE 150 MCG TABLET] 90 tablet 0    Sig: Take 1 tablet (150 mcg total) by mouth daily before breakfast.     Endocrinology:  Hypothyroid Agents Failed - 08/23/2018  9:01 AM      Failed - TSH needs to be rechecked within 3 months after an abnormal result. Refill until TSH is due.      Failed - TSH in normal range and within 360 days    TSH  Date Value Ref Range Status  07/07/2017 1.270 0.450 - 4.500 uIU/mL Final         Passed - Valid encounter within last 12 months    Recent Outpatient Visits          4 months ago Upper abdominal pain   Primary Care at [facility], [place], [place]   10 months ago Glucose intolerance (impaired glucose tolerance)   Primary Care at [facility], [place]ters, [place]   1 year ago Routine physical examination   Primary Care at [facility], [place], [place]   1 year ago Depression with anxiety   Primary Care at [facility], [place] Butters, [place]   2 years ago Visit for TB skin test   Primary Care at [facility], [place]ather Laurence, [place]

## 2018-08-24 NOTE — Telephone Encounter (Signed)
Left message for patient to call and make appt.

## 2018-09-16 ENCOUNTER — Other Ambulatory Visit: Payer: Self-pay | Admitting: Emergency Medicine

## 2018-10-20 ENCOUNTER — Other Ambulatory Visit: Payer: Self-pay | Admitting: Emergency Medicine

## 2018-11-06 ENCOUNTER — Encounter: Payer: Self-pay | Admitting: Emergency Medicine

## 2018-11-07 ENCOUNTER — Encounter: Payer: Self-pay | Admitting: Emergency Medicine

## 2018-12-02 ENCOUNTER — Telehealth: Payer: Self-pay | Admitting: Emergency Medicine

## 2018-12-02 NOTE — Telephone Encounter (Signed)
Pt requesting refill for synthroid. Next appt on 9/30

## 2018-12-03 ENCOUNTER — Other Ambulatory Visit: Payer: Self-pay | Admitting: Emergency Medicine

## 2018-12-04 MED ORDER — LEVOTHYROXINE SODIUM 150 MCG PO TABS: 150.0000 ug | ORAL_TABLET | Freq: Every day | ORAL | 0 refills | Status: DC

## 2018-12-18 ENCOUNTER — Other Ambulatory Visit: Payer: Self-pay

## 2018-12-18 ENCOUNTER — Ambulatory Visit (INDEPENDENT_AMBULATORY_CARE_PROVIDER_SITE_OTHER): Payer: 59 | Admitting: Emergency Medicine

## 2018-12-18 ENCOUNTER — Encounter: Payer: Self-pay | Admitting: Emergency Medicine

## 2018-12-18 VITALS — BP 127/83 | HR 96 | Temp 98.2°F | Resp 16 | Ht 68.0 in | Wt 246.8 lb

## 2018-12-18 DIAGNOSIS — E669 Obesity, unspecified: Secondary | ICD-10-CM | POA: Diagnosis not present

## 2018-12-18 DIAGNOSIS — Z6837 Body mass index (BMI) 37.0-37.9, adult: Secondary | ICD-10-CM | POA: Diagnosis not present

## 2018-12-18 DIAGNOSIS — E890 Postprocedural hypothyroidism: Secondary | ICD-10-CM | POA: Diagnosis not present

## 2018-12-18 MED ORDER — LEVOTHYROXINE SODIUM 150 MCG PO TABS: 150.0000 ug | ORAL_TABLET | Freq: Every day | ORAL | 3 refills | Status: DC

## 2018-12-18 NOTE — Patient Instructions (Addendum)
   If you have lab work done today you will be contacted with your lab results within the next 2 weeks.  If you have not heard from us then please contact us. The fastest way to get your results is to register for My Chart.   IF you received an x-ray today, you will receive an invoice from [facility]. Please contact [facility] at [phone] with questions or concerns regarding your invoice.   IF you received labwork today, you will receive an invoice from [facility]. Please contact [facility] at [phone] with questions or concerns regarding your invoice.   Our billing staff will not be able to assist you with questions regarding bills from these companies.  You will be contacted with the lab results as soon as they are available. The fastest way to get your results is to activate your My Chart account. Instructions are located on the last page of this paperwork. If you have not heard from us regarding the results in 2 weeks, please contact this office.      Health Maintenance, Female Adopting a healthy lifestyle and getting preventive care are important in promoting health and wellness. Ask your health care provider about:  The right schedule for you to have regular tests and exams.  Things you can do on your own to prevent diseases and keep yourself healthy. What should I know about diet, weight, and exercise? Eat a healthy diet   Eat a diet that includes plenty of vegetables, fruits, low-fat dairy products, and lean protein.  Do not eat a lot of foods that are high in solid fats, added sugars, or sodium. Maintain a healthy weight Body mass index (BMI) is used to identify weight problems. It estimates body fat based on height and weight. Your health care provider can help determine your BMI and help you achieve or maintain a healthy weight. Get regular exercise Get regular exercise. This is one of the most important things you can do for your health. Most  adults should:  Exercise for at least 150 minutes each week. The exercise should increase your heart rate and make you sweat (moderate-intensity exercise).  Do strengthening exercises at least twice a week. This is in addition to the moderate-intensity exercise.  Spend less time sitting. Even light physical activity can be beneficial. Watch cholesterol and blood lipids Have your blood tested for lipids and cholesterol at 48 years of age, then have this test every 5 years. Have your cholesterol levels checked more often if:  Your lipid or cholesterol levels are high.  You are older than 48 years of age.  You are at high risk for heart disease. What should I know about cancer screening? Depending on your health history and family history, you may need to have cancer screening at various ages. This may include screening for:  Breast cancer.  Cervical cancer.  Colorectal cancer.  Skin cancer.  Lung cancer. What should I know about heart disease, diabetes, and high blood pressure? Blood pressure and heart disease  High blood pressure causes heart disease and increases the risk of stroke. This is more likely to develop in people who have high blood pressure readings, are of African descent, or are overweight.  Have your blood pressure checked: ? Every 3-5 years if you are 18-39 years of age. ? Every year if you are 40 years old or older. Diabetes Have regular diabetes screenings. This checks your fasting blood sugar level. Have the screening done:  Once every   three years after age 40 if you are at a normal weight and have a low risk for diabetes.  More often and at a younger age if you are overweight or have a high risk for diabetes. What should I know about preventing infection? Hepatitis B If you have a higher risk for hepatitis B, you should be screened for this virus. Talk with your health care provider to find out if you are at risk for hepatitis B infection. Hepatitis  C Testing is recommended for:  Everyone born from 1945 through 1965.  Anyone with known risk factors for hepatitis C. Sexually transmitted infections (STIs)  Get screened for STIs, including gonorrhea and chlamydia, if: ? You are sexually active and are younger than 48 years of age. ? You are older than 48 years of age and your health care provider tells you that you are at risk for this type of infection. ? Your sexual activity has changed since you were last screened, and you are at increased risk for chlamydia or gonorrhea. Ask your health care provider if you are at risk.  Ask your health care provider about whether you are at high risk for HIV. Your health care provider may recommend a prescription medicine to help prevent HIV infection. If you choose to take medicine to prevent HIV, you should first get tested for HIV. You should then be tested every 3 months for as long as you are taking the medicine. Pregnancy  If you are about to stop having your period (premenopausal) and you may become pregnant, seek counseling before you get pregnant.  Take 400 to 800 micrograms (mcg) of folic acid every day if you become pregnant.  Ask for birth control (contraception) if you want to prevent pregnancy. Osteoporosis and menopause Osteoporosis is a disease in which the bones lose minerals and strength with aging. This can result in bone fractures. If you are 65 years old or older, or if you are at risk for osteoporosis and fractures, ask your health care provider if you should:  Be screened for bone loss.  Take a calcium or vitamin D supplement to lower your risk of fractures.  Be given hormone replacement therapy (HRT) to treat symptoms of menopause. Follow these instructions at home: Lifestyle  Do not use any products that contain nicotine or tobacco, such as cigarettes, e-cigarettes, and chewing tobacco. If you need help quitting, ask your health care provider.  Do not use street  drugs.  Do not share needles.  Ask your health care provider for help if you need support or information about quitting drugs. Alcohol use  Do not drink alcohol if: ? Your health care provider tells you not to drink. ? You are pregnant, may be pregnant, or are planning to become pregnant.  If you drink alcohol: ? Limit how much you use to 0-1 drink a day. ? Limit intake if you are breastfeeding.  Be aware of how much alcohol is in your drink. In the [place] one drink equals one 12 oz bottle of beer (355 mL), one 5 oz glass of wine (148 mL), or one 1 oz glass of hard liquor (44 mL). General instructions  Schedule regular health, dental, and eye exams.  Stay current with your vaccines.  Tell your health care provider if: ? You often feel depressed. ? You have ever been abused or do not feel safe at home. Summary  Adopting a healthy lifestyle and getting preventive care are important in promoting health and wellness.    Follow your health care provider's instructions about healthy diet, exercising, and getting tested or screened for diseases.  Follow your health care provider's instructions on monitoring your cholesterol and blood pressure. This information is not intended to replace advice given to you by your health care provider. Make sure you discuss any questions you have with your health care provider. Document Released: 09/19/2010 Document Revised: 02/27/2018 Document Reviewed: 02/27/2018 Elsevier Patient Education  [place].  Hypothyroidism  Hypothyroidism is when the thyroid gland does not make enough of certain hormones (it is underactive). The thyroid gland is a small gland located in the lower front part of the neck, just in front of the windpipe (trachea). This gland makes hormones that help control how the body uses food for energy (metabolism) as well as how the heart and brain function. These hormones also play a role in keeping your bones strong. When the  thyroid is underactive, it produces too little of the hormones thyroxine (T4) and triiodothyronine (T3). What are the causes? This condition may be caused by:  Hashimoto's disease. This is a disease in which the body's disease-fighting system (immune system) attacks the thyroid gland. This is the most common cause.  Viral infections.  Pregnancy.  Certain medicines.  Birth defects.  Past radiation treatments to the head or neck for cancer.  Past treatment with radioactive iodine.  Past exposure to radiation in the environment.  Past surgical removal of part or all of the thyroid.  Problems with a gland in the center of the brain (pituitary gland).  Lack of enough iodine in the diet. What increases the risk? You are more likely to develop this condition if:  You are female.  You have a family history of thyroid conditions.  You use a medicine called lithium.  You take medicines that affect the immune system (immunosuppressants). What are the signs or symptoms? Symptoms of this condition include:  Feeling as though you have no energy (lethargy).  Not being able to tolerate cold.  Weight gain that is not explained by a change in diet or exercise habits.  Lack of appetite.  Dry skin.  Coarse hair.  Menstrual irregularity.  Slowing of thought processes.  Constipation.  Sadness or depression. How is this diagnosed? This condition may be diagnosed based on:  Your symptoms, your medical history, and a physical exam.  Blood tests. You may also have imaging tests, such as an ultrasound or MRI. How is this treated? This condition is treated with medicine that replaces the thyroid hormones that your body does not make. After you begin treatment, it may take several weeks for symptoms to go away. Follow these instructions at home:  Take over-the-counter and prescription medicines only as told by your health care provider.  If you start taking any new medicines,  tell your health care provider.  Keep all follow-up visits as told by your health care provider. This is important. ? As your condition improves, your dosage of thyroid hormone medicine may change. ? You will need to have blood tests regularly so that your health care provider can monitor your condition. Contact a health care provider if:  Your symptoms do not get better with treatment.  You are taking thyroid replacement medicine and you: ? Sweat a lot. ? Have tremors. ? Feel anxious. ? Lose weight rapidly. ? Cannot tolerate heat. ? Have emotional swings. ? Have diarrhea. ? Feel weak. Get help right away if you have:  Chest pain.  An irregular heartbeat.  A rapid heartbeat.  Difficulty breathing. Summary  Hypothyroidism is when the thyroid gland does not make enough of certain hormones (it is underactive).  When the thyroid is underactive, it produces too little of the hormones thyroxine (T4) and triiodothyronine (T3).  The most common cause is Hashimoto's disease, a disease in which the body's disease-fighting system (immune system) attacks the thyroid gland. The condition can also be caused by viral infections, medicine, pregnancy, or past radiation treatment to the head or neck.  Symptoms may include weight gain, dry skin, constipation, feeling as though you do not have energy, and not being able to tolerate cold.  This condition is treated with medicine to replace the thyroid hormones that your body does not make. This information is not intended to replace advice given to you by your health care provider. Make sure you discuss any questions you have with your health care provider. Document Released: 03/06/2005 Document Revised: 02/16/2017 Document Reviewed: 02/14/2017 Elsevier Patient Education  2020 [employer].

## 2018-12-18 NOTE — Progress Notes (Signed)
Taylor Powers 48 y.o.   Chief Complaint  Patient presents with  . Medication Refill    Synthroid    HISTORY OF PRESENT ILLNESS: This is a 48 y.o. female with history of postsurgical hypothyroidism here for follow-up and medication refill.  Patient takes Synthroid 150 mcg daily.  No other complaints or medical concerns today.  HPI   Prior to Admission medications   Medication Sig Start Date End Date Taking? Authorizing Provider  buPROPion (WELLBUTRIN XL) 300 MG 24 hr tablet Take 1 tablet (300 mg total) by mouth daily. 08/22/18  Yes Thayer Headings, PMHNP  levothyroxine (SYNTHROID) 150 MCG tablet Take 1 tablet (150 mcg total) by mouth daily before breakfast. SCHEDULE OFFICE VISIT 12/04/18  Yes Nicky Kras, Ines Bloomer, MD  lisdexamfetamine (VYVANSE) 50 MG capsule Take 1 capsule (50 mg total) by mouth daily. 08/22/18  Yes Thayer Headings, PMHNP  metaxalone (SKELAXIN) 800 MG tablet Take 1 tablet (800 mg total) by mouth 3 (three) times daily as needed for muscle spasms. 10/11/16  Yes Wardell Honour, MD  sertraline (ZOLOFT) 100 MG tablet Take 1 tablet (100 mg total) by mouth daily. 08/22/18  Yes Thayer Headings, PMHNP  calcium-vitamin D (OSCAL WITH D) 250-125 MG-UNIT tablet Take 1 tablet by mouth daily.    [provider]  ibuprofen (ADVIL,MOTRIN) 200 MG tablet Take 600 mg by mouth daily as needed for headache.    [provider]  lisdexamfetamine (VYVANSE) 50 MG capsule Take 1 capsule (50 mg total) by mouth daily for 30 days. 10/17/18 11/16/18  Thayer Headings, PMHNP  lisdexamfetamine (VYVANSE) 50 MG capsule Take 1 capsule (50 mg total) by mouth daily for 30 days. 09/19/18 10/19/18  Thayer Headings, PMHNP  Multiple Vitamin (MULTIVITAMIN WITH MINERALS) TABS tablet Take 1 tablet by mouth daily.    [provider]  Omega-3 Fatty Acids (FISH OIL) 1000 MG CAPS Take 1,000 mg by mouth 2 (two) times daily.    [provider]    Allergies  Allergen Reactions  . Sulfa Antibiotics  Hives    All over body    Patient Active Problem List   Diagnosis Date Noted  . Attention deficit hyperactivity disorder (ADHD), combined type 10/11/2017  . Postsurgical hypothyroidism 03/30/2015  . Glucose intolerance (impaired glucose tolerance) 10/14/2014  . Depression with anxiety 05/20/2013  . Pure hypercholesterolemia 05/20/2013  . Cervical high risk HPV (human papillomavirus) test positive 05/20/2013  . Cancer of thyroid T3N0 S/P thyroidectomy 10/07/10 11/03/2010    Past Medical History:  Diagnosis Date  . Absence of menstruation   . Allergic rhinitis, cause unspecified   . Anxiety state, unspecified   . Arthritis    DDD L4-5; ruptured disc; laminectomy.  . Cancer ([place]) 09/18/2010   thyroid cancer; s/p thyroidectomy total; s/p ablation.  [place].  . Chicken pox   . Diabetes mellitus   . Dysplasia of cervix, unspecified   . Dysthymic disorder   . Hematuria, unspecified   . Hypersomnia, unspecified    night shift work  . Insomnia, unspecified   . Mixed hyperlipidemia   . Other abnormal glucose   . Other acne   . Thyroid disease   . Tobacco use disorder   . Unspecified vitamin D deficiency     Past Surgical History:  Procedure Laterality Date  . CHOLECYSTECTOMY N/A 04/15/2018   Procedure: LAPAROSCOPIC CHOLECYSTECTOMY WITH INTRAOPERATIVE CHOLANGIOGRAM;  Surgeon: Alphonsa Overall, MD;  Location: [facility];  Service: General;  Laterality: N/A;  . history of LEEP  2005  .  LAMINECTOMY  1996   L4-L5  . lipoma of shoulder  2005   Right . removed  . SPINE SURGERY  03/20/1994   L4-L5 laminectomy  . TOTAL THYROIDECTOMY  2012  . TUBAL LIGATION  1998    Social History   Socioeconomic History  . Marital status: Married    Spouse name: Dawayne Cirri  . Number of children: 3  . Years of education: Not on file  . Highest education level: Not on file  Occupational History  . Occupation: Psych ED    Employer: [employer]  . Occupation: Programmer, multimedia: [employer]  . Financial resource strain: Not on file  . Food insecurity    Worry: Not on file    Inability: Not on file  . Transportation needs    Medical: Not on file    Non-medical: Not on file  Tobacco Use  . Smoking status: Former Smoker    Packs/day: 0.50    Years: 27.00    Pack years: 13.50    Types: Cigarettes  . Smokeless tobacco: Never Used  Substance and Sexual Activity  . Alcohol use: No    Alcohol/week: 0.0 standard drinks  . Drug use: No  . Sexual activity: Yes    Comment: same sexual partner  Lifestyle  . Physical activity    Days per week: Not on file    Minutes per session: Not on file  . Stress: Not on file  Relationships  . Social Herbalist on phone: Not on file    Gets together: Not on file    Attends religious service: Not on file    Active member of club or organization: Not on file    Attends meetings of clubs or organizations: Not on file    Relationship status: Not on file  . Intimate partner violence    Fear of current or ex partner: Not on file    Emotionally abused: Not on file    Physically abused: Not on file    Forced sexual activity: Not on file  Other Topics Concern  . Not on file  Social History Narrative   Marital status:  Married same sex partner 12/2013 Dawayne Cirri. Happy, no abuse.      Children:  3 children (24, 22, 20); 3 grandchildren.        Lives: with wife.      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  Two sixteen hour shifts followed by 8 hour shift; weekends.  Off Mon-Thurs.  [place] job.  Graduate school in 2019.      Tobacco: 1 ppd x 30 years.        Alcohol: none; partner/wife in recovery.      Drugs: none      Exercise: none      Caffeine use: diet Pepsi and coffee.      Always uses seat belts.       Smoke alarm and carbon monoxide detector in the home.       No guns.     Family History  Problem Relation Age of Onset  . Depression Mother   . Kidney disease  Mother        R Nephrectomy kidney stones  . Irritable bowel syndrome Mother   . Diabetes Father   . Depression Father   . Cancer Maternal Grandfather   . Bipolar disorder Son   . ADD / ADHD  Son   . Hypertension Paternal Grandmother   . ADD / ADHD Brother   . ADD / ADHD Brother      Review of Systems  Constitutional: Negative.  Negative for chills and fever.  HENT: Negative.  Negative for congestion and sore throat.   Eyes: Negative.   Respiratory: Negative.  Negative for cough and shortness of breath.   Cardiovascular: Negative.  Negative for chest pain and palpitations.  Gastrointestinal: Negative.  Negative for abdominal pain, diarrhea, nausea and vomiting.  Genitourinary: Negative.  Negative for dysuria and hematuria.  Skin: Negative.  Negative for rash.  Neurological: Negative.  Negative for dizziness and headaches.  Endo/Heme/Allergies: Negative.   All other systems reviewed and are negative.  Vitals:   12/18/18 0843  BP: 127/83  Pulse: 96  Resp: 16  Temp: 98.2 F (36.8 C)  SpO2: 96%     Physical Exam Vitals signs reviewed.  Constitutional:      Appearance: Normal appearance.  HENT:     Head: Normocephalic.  Eyes:     Extraocular Movements: Extraocular movements intact.     Conjunctiva/sclera: Conjunctivae normal.     Pupils: Pupils are equal, round, and reactive to light.  Neck:     Musculoskeletal: Normal range of motion and neck supple.  Cardiovascular:     Rate and Rhythm: Normal rate and regular rhythm.     Pulses: Normal pulses.     Heart sounds: Normal heart sounds.  Pulmonary:     Effort: Pulmonary effort is normal.     Breath sounds: Normal breath sounds.  Musculoskeletal: Normal range of motion.  Skin:    General: Skin is warm and dry.     Capillary Refill: Capillary refill takes less than 2 seconds.  Neurological:     General: No focal deficit present.     Mental Status: She is alert and oriented to person, place, and time.   Psychiatric:        Mood and Affect: Mood normal.        Behavior: Behavior normal.     A total of 25 minutes was spent in the room with the patient, greater than 50% of which was in counseling/coordination of care regarding thyroid disorder, medications, diet and nutrition, importance of physical activity, need for blood work, review of previous blood results, prognosis, and need for follow-up.  ASSESSMENT & PLAN: Sharlisa was seen today for medication refill.  Diagnoses and all orders for this visit:  Postsurgical hypothyroidism -     levothyroxine (SYNTHROID) 150 MCG tablet; Take 1 tablet (150 mcg total) by mouth daily before breakfast. SCHEDULE OFFICE VISIT -     CBC with Differential/Platelet -     Comprehensive metabolic panel -     Lipid panel -     Thyroid Panel With TSH  Class 2 obesity without serious comorbidity with body mass index (BMI) of 37.0 to 37.9 in adult, unspecified obesity type    Patient Instructions       If you have lab work done today you will be contacted with your lab results within the next 2 weeks.  If you have not heard from [place] then please contact us. The fastest way to get your results is to register for My Chart.   IF you received an x-ray today, you will receive an invoice from [facility]. Please contact [facility] at [phone] with questions or concerns regarding your invoice.   IF you received labwork today, you will receive an invoice  from [facility]. Please contact [facility] at [phone] with questions or concerns regarding your invoice.   Our billing staff will not be able to assist you with questions regarding bills from these companies.  You will be contacted with the lab results as soon as they are available. The fastest way to get your results is to activate your My Chart account. Instructions are located on the last page of this paperwork. If you have not heard from [place] regarding the results in 2 weeks, please  contact this office.     Health Maintenance, Female Adopting a healthy lifestyle and getting preventive care are important in promoting health and wellness. Ask your health care provider about:  The right schedule for you to have regular tests and exams.  Things you can do on your own to prevent diseases and keep yourself healthy. What should I know about diet, weight, and exercise? Eat a healthy diet   Eat a diet that includes plenty of vegetables, fruits, low-fat dairy products, and lean protein.  Do not eat a lot of foods that are high in solid fats, added sugars, or sodium. Maintain a healthy weight Body mass index (BMI) is used to identify weight problems. It estimates body fat based on height and weight. Your health care provider can help determine your BMI and help you achieve or maintain a healthy weight. Get regular exercise Get regular exercise. This is one of the most important things you can do for your health. Most adults should:  Exercise for at least 150 minutes each week. The exercise should increase your heart rate and make you sweat (moderate-intensity exercise).  Do strengthening exercises at least twice a week. This is in addition to the moderate-intensity exercise.  Spend less time sitting. Even light physical activity can be beneficial. Watch cholesterol and blood lipids Have your blood tested for lipids and cholesterol at 48 years of age, then have this test every 5 years. Have your cholesterol levels checked more often if:  Your lipid or cholesterol levels are high.  You are older than 48 years of age.  You are at high risk for heart disease. What should I know about cancer screening? Depending on your health history and family history, you may need to have cancer screening at various ages. This may include screening for:  Breast cancer.  Cervical cancer.  Colorectal cancer.  Skin cancer.  Lung cancer. What should I know about heart disease,  diabetes, and high blood pressure? Blood pressure and heart disease  High blood pressure causes heart disease and increases the risk of stroke. This is more likely to develop in people who have high blood pressure readings, are of African descent, or are overweight.  Have your blood pressure checked: ? Every 3-5 years if you are 81-47 years of age. ? Every year if you are 98 years old or older. Diabetes Have regular diabetes screenings. This checks your fasting blood sugar level. Have the screening done:  Once every three years after age 57 if you are at a normal weight and have a low risk for diabetes.  More often and at a younger age if you are overweight or have a high risk for diabetes. What should I know about preventing infection? Hepatitis B If you have a higher risk for hepatitis B, you should be screened for this virus. Talk with your health care provider to find out if you are at risk for hepatitis B infection. Hepatitis C Testing is recommended for:  Everyone  born from [birth year] through 1965.  Anyone with known risk factors for hepatitis C. Sexually transmitted infections (STIs)  Get screened for STIs, including gonorrhea and chlamydia, if: ? You are sexually active and are younger than 48 years of age. ? You are older than 48 years of age and your health care provider tells you that you are at risk for this type of infection. ? Your sexual activity has changed since you were last screened, and you are at increased risk for chlamydia or gonorrhea. Ask your health care provider if you are at risk.  Ask your health care provider about whether you are at high risk for HIV. Your health care provider may recommend a prescription medicine to help prevent HIV infection. If you choose to take medicine to prevent HIV, you should first get tested for HIV. You should then be tested every 3 months for as long as you are taking the medicine. Pregnancy  If you are about to stop having your  period (premenopausal) and you may become pregnant, seek counseling before you get pregnant.  Take 400 to 800 micrograms (mcg) of folic acid every day if you become pregnant.  Ask for birth control (contraception) if you want to prevent pregnancy. Osteoporosis and menopause Osteoporosis is a disease in which the bones lose minerals and strength with aging. This can result in bone fractures. If you are 37 years old or older, or if you are at risk for osteoporosis and fractures, ask your health care provider if you should:  Be screened for bone loss.  Take a calcium or vitamin D supplement to lower your risk of fractures.  Be given hormone replacement therapy (HRT) to treat symptoms of menopause. Follow these instructions at home: Lifestyle  Do not use any products that contain nicotine or tobacco, such as cigarettes, e-cigarettes, and chewing tobacco. If you need help quitting, ask your health care provider.  Do not use street drugs.  Do not share needles.  Ask your health care provider for help if you need support or information about quitting drugs. Alcohol use  Do not drink alcohol if: ? Your health care provider tells you not to drink. ? You are pregnant, may be pregnant, or are planning to become pregnant.  If you drink alcohol: ? Limit how much you use to 0-1 drink a day. ? Limit intake if you are breastfeeding.  Be aware of how much alcohol is in your drink. In the [place] one drink equals one 12 oz bottle of beer (355 mL), one 5 oz glass of wine (148 mL), or one 1 oz glass of hard liquor (44 mL). General instructions  Schedule regular health, dental, and eye exams.  Stay current with your vaccines.  Tell your health care provider if: ? You often feel depressed. ? You have ever been abused or do not feel safe at home. Summary  Adopting a healthy lifestyle and getting preventive care are important in promoting health and wellness.  Follow your health care provider's  instructions about healthy diet, exercising, and getting tested or screened for diseases.  Follow your health care provider's instructions on monitoring your cholesterol and blood pressure. This information is not intended to replace advice given to you by your health care provider. Make sure you discuss any questions you have with your health care provider. Document Released: 09/19/2010 Document Revised: 02/27/2018 Document Reviewed: 02/27/2018 Elsevier Patient Education  [place].  Hypothyroidism  Hypothyroidism is when the thyroid gland does not make  enough of certain hormones (it is underactive). The thyroid gland is a small gland located in the lower front part of the neck, just in front of the windpipe (trachea). This gland makes hormones that help control how the body uses food for energy (metabolism) as well as how the heart and brain function. These hormones also play a role in keeping your bones strong. When the thyroid is underactive, it produces too little of the hormones thyroxine (T4) and triiodothyronine (T3). What are the causes? This condition may be caused by:  Hashimoto's disease. This is a disease in which the body's disease-fighting system (immune system) attacks the thyroid gland. This is the most common cause.  Viral infections.  Pregnancy.  Certain medicines.  Birth defects.  Past radiation treatments to the head or neck for cancer.  Past treatment with radioactive iodine.  Past exposure to radiation in the environment.  Past surgical removal of part or all of the thyroid.  Problems with a gland in the center of the brain (pituitary gland).  Lack of enough iodine in the diet. What increases the risk? You are more likely to develop this condition if:  You are female.  You have a family history of thyroid conditions.  You use a medicine called lithium.  You take medicines that affect the immune system (immunosuppressants). What are the signs  or symptoms? Symptoms of this condition include:  Feeling as though you have no energy (lethargy).  Not being able to tolerate cold.  Weight gain that is not explained by a change in diet or exercise habits.  Lack of appetite.  Dry skin.  Coarse hair.  Menstrual irregularity.  Slowing of thought processes.  Constipation.  Sadness or depression. How is this diagnosed? This condition may be diagnosed based on:  Your symptoms, your medical history, and a physical exam.  Blood tests. You may also have imaging tests, such as an ultrasound or MRI. How is this treated? This condition is treated with medicine that replaces the thyroid hormones that your body does not make. After you begin treatment, it may take several weeks for symptoms to go away. Follow these instructions at home:  Take over-the-counter and prescription medicines only as told by your health care provider.  If you start taking any new medicines, tell your health care provider.  Keep all follow-up visits as told by your health care provider. This is important. ? As your condition improves, your dosage of thyroid hormone medicine may change. ? You will need to have blood tests regularly so that your health care provider can monitor your condition. Contact a health care provider if:  Your symptoms do not get better with treatment.  You are taking thyroid replacement medicine and you: ? Sweat a lot. ? Have tremors. ? Feel anxious. ? Lose weight rapidly. ? Cannot tolerate heat. ? Have emotional swings. ? Have diarrhea. ? Feel weak. Get help right away if you have:  Chest pain.  An irregular heartbeat.  A rapid heartbeat.  Difficulty breathing. Summary  Hypothyroidism is when the thyroid gland does not make enough of certain hormones (it is underactive).  When the thyroid is underactive, it produces too little of the hormones thyroxine (T4) and triiodothyronine (T3).  The most common cause is  Hashimoto's disease, a disease in which the body's disease-fighting system (immune system) attacks the thyroid gland. The condition can also be caused by viral infections, medicine, pregnancy, or past radiation treatment to the head or neck.  Symptoms may  include weight gain, dry skin, constipation, feeling as though you do not have energy, and not being able to tolerate cold.  This condition is treated with medicine to replace the thyroid hormones that your body does not make. This information is not intended to replace advice given to you by your health care provider. Make sure you discuss any questions you have with your health care provider. Document Released: 03/06/2005 Document Revised: 02/16/2017 Document Reviewed: 02/14/2017 Elsevier Patient Education  2020 Elsevier Inc.      Agustina Caroli, MD [facility]

## 2018-12-19 ENCOUNTER — Encounter: Payer: Self-pay | Admitting: Emergency Medicine

## 2018-12-19 LAB — CBC WITH DIFFERENTIAL/PLATELET
Basophils Absolute: 0 10*3/uL (ref 0.0–0.2)
Basos: 1 %
EOS (ABSOLUTE): 0.1 10*3/uL (ref 0.0–0.4)
Eos: 1 %
Hematocrit: 36.9 % (ref 34.0–46.6)
Hemoglobin: 12.4 g/dL (ref 11.1–15.9)
Immature Grans (Abs): 0 10*3/uL (ref 0.0–0.1)
Immature Granulocytes: 0 %
Lymphocytes Absolute: 1.9 10*3/uL (ref 0.7–3.1)
Lymphs: 42 %
MCH: 29.2 pg (ref 26.6–33.0)
MCHC: 33.6 g/dL (ref 31.5–35.7)
MCV: 87 fL (ref 79–97)
Monocytes Absolute: 0.3 10*3/uL (ref 0.1–0.9)
Monocytes: 7 %
Neutrophils Absolute: 2.2 10*3/uL (ref 1.4–7.0)
Neutrophils: 49 %
Platelets: 188 10*3/uL (ref 150–450)
RBC: 4.24 x10E6/uL (ref 3.77–5.28)
RDW: 12.9 % (ref 11.7–15.4)
WBC: 4.5 10*3/uL (ref 3.4–10.8)

## 2018-12-19 LAB — COMPREHENSIVE METABOLIC PANEL
ALT: 29 IU/L (ref 0–32)
AST: 18 IU/L (ref 0–40)
Albumin/Globulin Ratio: 1.7 (ref 1.2–2.2)
Albumin: 4.5 g/dL (ref 3.8–4.8)
Alkaline Phosphatase: 102 IU/L (ref 39–117)
BUN/Creatinine Ratio: 14 (ref 9–23)
BUN: 14 mg/dL (ref 6–24)
Bilirubin Total: 0.4 mg/dL (ref 0.0–1.2)
CO2: 22 mmol/L (ref 20–29)
Calcium: 9.7 mg/dL (ref 8.7–10.2)
Chloride: 105 mmol/L (ref 96–106)
Creatinine, Ser: 0.97 mg/dL (ref 0.57–1.00)
GFR calc Af Amer: 80 mL/min/{1.73_m2} (ref >59)
GFR calc non Af Amer: 69 mL/min/{1.73_m2} (ref >59)
Globulin, Total: 2.7 g/dL (ref 1.5–4.5)
Glucose: 104 mg/dL — ABNORMAL HIGH (ref 65–99)
Potassium: 4.7 mmol/L (ref 3.5–5.2)
Sodium: 141 mmol/L (ref 134–144)
Total Protein: 7.2 g/dL (ref 6.0–8.5)

## 2018-12-19 LAB — LIPID PANEL
Chol/HDL Ratio: 6.1 ratio — ABNORMAL HIGH (ref 0.0–4.4)
Cholesterol, Total: 189 mg/dL (ref 100–199)
HDL: 31 mg/dL — ABNORMAL LOW (ref >39)
LDL Chol Calc (NIH): 129 mg/dL — ABNORMAL HIGH (ref 0–99)
Triglycerides: 162 mg/dL — ABNORMAL HIGH (ref 0–149)
VLDL Cholesterol Cal: 29 mg/dL (ref 5–40)

## 2018-12-19 LAB — THYROID PANEL WITH TSH
Free Thyroxine Index: 2.2 (ref 1.2–4.9)
T3 Uptake Ratio: 27 % (ref 24–39)
T4, Total: 8 ug/dL (ref 4.5–12.0)
TSH: 0.643 u[IU]/mL (ref 0.450–4.500)

## 2019-01-28 ENCOUNTER — Ambulatory Visit (INDEPENDENT_AMBULATORY_CARE_PROVIDER_SITE_OTHER): Payer: 59 | Admitting: Psychiatry

## 2019-01-28 ENCOUNTER — Encounter: Payer: Self-pay | Admitting: Psychiatry

## 2019-01-28 ENCOUNTER — Other Ambulatory Visit: Payer: Self-pay

## 2019-01-28 DIAGNOSIS — F3342 Major depressive disorder, recurrent, in full remission: Secondary | ICD-10-CM | POA: Diagnosis not present

## 2019-01-28 DIAGNOSIS — F900 Attention-deficit hyperactivity disorder, predominantly inattentive type: Secondary | ICD-10-CM | POA: Diagnosis not present

## 2019-01-28 MED ORDER — LISDEXAMFETAMINE DIMESYLATE 50 MG PO CAPS: 50.0000 mg | ORAL_CAPSULE | Freq: Every day | ORAL | 0 refills | Status: DC

## 2019-01-28 MED ORDER — SERTRALINE HCL 100 MG PO TABS: 100.0000 mg | ORAL_TABLET | Freq: Every day | ORAL | 1 refills | Status: DC

## 2019-01-28 MED ORDER — BUPROPION HCL ER (XL) 300 MG PO TB24: 300.0000 mg | ORAL_TABLET | Freq: Every day | ORAL | 1 refills | Status: DC

## 2019-01-28 NOTE — [facility] Notes (Signed)
Dim Cardi [id number] 11/01/1970 48 y.o.  Virtual Visit via Telephone Note  I connected with pt on 01/28/19 at  2:00 PM EST by telephone and verified that I am speaking with the correct person using two identifiers.   I discussed the limitations, risks, security and privacy concerns of performing an evaluation and management service by telephone and the availability of in person appointments. I also discussed with the patient that there may be a patient responsible charge related to this service. The patient expressed understanding and agreed to proceed.   I discussed the assessment and treatment plan with the patient. The patient was provided an opportunity to ask questions and all were answered. The patient agreed with the plan and demonstrated an understanding of the instructions.   The patient was advised to call back or seek an in-person evaluation if the symptoms worsen or if the condition fails to improve as anticipated.  I provided 25 minutes of non-face-to-face time during this encounter.  The patient was located at home.  The provider was located at [place].   Taylor Powers, PMHNP   Subjective:   Patient ID:  Taylor Powers is a 48 y.o. (DOB [birth year]) female.  Chief Complaint:  Chief Complaint  Patient presents with  . Follow-up    ADD and h/o Depression and anxiety    HPI Taylor Powers presents for follow-up of depression. She reports that her mood has been stable. She reports that she is "never joyful, I'm always fine." Reports that she is content about things overall and experiences appropriate sadness, such as the loss of a pet. Reports that her motivation is low towards leisure activities and past interests. She reports that her motivation has been good for work. She reports that her energy has been ok. She reports that she has been walking her dogs daily at the end of her work day. She reports that she had social anxiety in school and no longer has any  anxiety. She reports that she is sleeping well and wakes up feeling rested. She reports that appetite has been good. Denies SI.   She reports that concentration has significantly improved with Vyvanse.   She is working from home and is seeing patients remotely.   Has 3 adult children. She reports that she had excessive guilt in the past Past Psychiatric Medication Trials: Lexapro- Took after her divorce Wellbutrin XL Vyvanse- Effective Sertraline- 100 mg is effective. Noticed some affective dulling at 150 mg   Review of Systems:  Review of Systems  Cardiovascular: Negative for palpitations.  Musculoskeletal: Negative for gait problem.  Neurological: Negative for tremors.  Psychiatric/Behavioral:       Please refer to HPI   Recent physical exam Medications: I have reviewed the patient's current medications.  Current Outpatient Medications  Medication Sig Dispense Refill  . buPROPion (WELLBUTRIN XL) 300 MG 24 hr tablet Take 1 tablet (300 mg total) by mouth daily. 90 tablet 1  . calcium-vitamin D (OSCAL WITH D) 250-125 MG-UNIT tablet Take 1 tablet by mouth daily.    Marland Kitchen ibuprofen (ADVIL,MOTRIN) 200 MG tablet Take 600 mg by mouth daily as needed for headache.    . levothyroxine (SYNTHROID) 150 MCG tablet Take 1 tablet (150 mcg total) by mouth daily before breakfast. SCHEDULE OFFICE VISIT 90 tablet 3  . [START ON 03/25/2019] lisdexamfetamine (VYVANSE) 50 MG capsule Take 1 capsule (50 mg total) by mouth daily. 30 capsule 0  . Multiple Vitamin (MULTIVITAMIN WITH MINERALS) TABS tablet Take 1 tablet by  mouth daily.    . Omega-3 Fatty Acids (FISH OIL) 1000 MG CAPS Take 1,000 mg by mouth 2 (two) times daily.    . sertraline (ZOLOFT) 100 MG tablet Take 1 tablet (100 mg total) by mouth daily. 90 tablet 1  . [START ON 02/25/2019] lisdexamfetamine (VYVANSE) 50 MG capsule Take 1 capsule (50 mg total) by mouth daily. 30 capsule 0  . lisdexamfetamine (VYVANSE) 50 MG capsule Take 1 capsule (50 mg total)  by mouth daily. 30 capsule 0  . metaxalone (SKELAXIN) 800 MG tablet Take 1 tablet (800 mg total) by mouth 3 (three) times daily as needed for muscle spasms. 60 tablet 2   No current facility-administered medications for this visit.     Medication Side Effects: None  Allergies:  Allergies  Allergen Reactions  . Sulfa Antibiotics Hives    All over body    Past Medical History:  Diagnosis Date  . Absence of menstruation   . Allergic rhinitis, cause unspecified   . Anxiety state, unspecified   . Arthritis    DDD L4-5; ruptured disc; laminectomy.  . Cancer ([place]) 09/18/2010   thyroid cancer; s/p thyroidectomy total; s/p ablation.  [facility].  . Chicken pox   . Diabetes mellitus   . Dysplasia of cervix, unspecified   . Dysthymic disorder   . Hematuria, unspecified   . Hypersomnia, unspecified    night shift work  . Insomnia, unspecified   . Mixed hyperlipidemia   . Other abnormal glucose   . Other acne   . Thyroid disease   . Tobacco use disorder   . Unspecified vitamin D deficiency     Family History  Problem Relation Age of Onset  . Depression Mother   . Kidney disease Mother        R Nephrectomy kidney stones  . Irritable bowel syndrome Mother   . Diabetes Father   . Depression Father   . Cancer Maternal Grandfather   . Bipolar disorder Son   . ADD / ADHD Son   . Hypertension Paternal Grandmother   . ADD / ADHD Brother   . ADD / ADHD Brother     Social History   Socioeconomic History  . Marital status: Married    Spouse name: Taylor Powers  . Number of children: 3  . Years of education: Not on file  . Highest education level: Not on file  Occupational History  . Occupation: Psych ED    Employer: [employer]  . Occupation: Programmer, multimedia: [employer]  . Financial resource strain: Not on file  . Food insecurity    Worry: Not on file    Inability: Not on file  . Transportation needs    Medical: Not on file    Non-medical: Not on file   Tobacco Use  . Smoking status: Former Smoker    Packs/day: 0.50    Years: 27.00    Pack years: 13.50    Types: Cigarettes  . Smokeless tobacco: Never Used  Substance and Sexual Activity  . Alcohol use: No    Alcohol/week: 0.0 standard drinks  . Drug use: No  . Sexual activity: Yes    Comment: same sexual partner  Lifestyle  . Physical activity    Days per week: Not on file    Minutes per session: Not on file  . Stress: Not on file  Relationships  . Social connections    Talks on phone: Not on file  Gets together: Not on file    Attends religious service: Not on file    Active member of club or organization: Not on file    Attends meetings of clubs or organizations: Not on file    Relationship status: Not on file  . Intimate partner violence    Fear of current or ex partner: Not on file    Emotionally abused: Not on file    Physically abused: Not on file    Forced sexual activity: Not on file  Other Topics Concern  . Not on file  Social History Narrative   Marital status:  Married same sex partner 12/2013 Taylor Powers. Happy, no abuse.      Children:  3 children (24, 22, 20); 3 grandchildren.        Lives: with wife.      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  Two sixteen hour shifts followed by 8 hour shift; weekends.  Off Mon-Thurs.  [place] job.  Graduate school in 2019.      Tobacco: 1 ppd x 30 years.        Alcohol: none; partner/wife in recovery.      Drugs: none      Exercise: none      Caffeine use: diet Pepsi and coffee.      Always uses seat belts.       Smoke alarm and carbon monoxide detector in the home.       No guns.     Past Medical History, Surgical history, Social history, and Family history were reviewed and updated as appropriate.   Please see review of systems for further details on the patient's review from today.   Objective:   Physical Exam:  BP 128/78   Pulse 88   LMP 03/05/2013    Physical Exam Constitutional:      General: She is not in acute distress.    Appearance: She is well-developed.  Musculoskeletal:        General: No deformity.  Neurological:     Mental Status: She is alert and oriented to person, place, and time.     Coordination: Coordination normal.  Psychiatric:        Attention and Perception: Attention and perception normal. She does not perceive auditory or visual hallucinations.        Mood and Affect: Mood normal. Mood is not anxious or depressed. Affect is not labile, blunt, angry or inappropriate.        Speech: Speech normal.        Behavior: Behavior normal.        Thought Content: Thought content normal. Thought content does not include homicidal or suicidal ideation. Thought content does not include homicidal or suicidal plan.        Cognition and Memory: Cognition and memory normal.        Judgment: Judgment normal.     Comments: Insight intact. No delusions.      Lab Review:     Component Value Date/Time   NA 141 12/18/2018 0937   K 4.7 12/18/2018 0937   CL 105 12/18/2018 0937   CO2 22 12/18/2018 0937   GLUCOSE 104 (H) 12/18/2018 0937   GLUCOSE 102 (H) 11/24/2015 1519   BUN 14 12/18/2018 0937   CREATININE 0.97 12/18/2018 0937   CREATININE 0.77 11/24/2015 1519   CALCIUM 9.7 12/18/2018 0937   PROT 7.2 12/18/2018 0937   ALBUMIN 4.5 12/18/2018 0937   AST 18  12/18/2018 0937   ALT 29 12/18/2018 0937   ALKPHOS 102 12/18/2018 0937   BILITOT 0.4 12/18/2018 0937   GFRNONAA 69 12/18/2018 0937   GFRNONAA >89 06/17/2013 0909   GFRAA 80 12/18/2018 0937   GFRAA >89 06/17/2013 0909       Component Value Date/Time   WBC 4.5 12/18/2018 0937   WBC 5.7 11/24/2015 1532   WBC 6.7 10/14/2014 0820   RBC 4.24 12/18/2018 0937   RBC 4.26 11/24/2015 1532   RBC 4.63 10/14/2014 0820   HGB 12.4 12/18/2018 0937   HCT 36.9 12/18/2018 0937   PLT 188 12/18/2018 0937   MCV 87 12/18/2018 0937   MCH 29.2 12/18/2018 0937   MCH 30.0  11/24/2015 1532   MCH 28.5 10/14/2014 0820   MCHC 33.6 12/18/2018 0937   MCHC 35.4 11/24/2015 1532   MCHC 34.1 10/14/2014 0820   RDW 12.9 12/18/2018 0937   LYMPHSABS 1.9 12/18/2018 0937   MONOABS 0.4 10/14/2014 0820   EOSABS 0.1 12/18/2018 0937   BASOSABS 0.0 12/18/2018 0937    No results found for: POCLITH, LITHIUM   No results found for: PHENYTOIN, PHENOBARB, VALPROATE, CBMZ   .res Assessment: Plan:   Discussed recent decrease in motivation for some of her interests and hobbies and pt reports that it may be r/t pandemic since it started around the time of quarantine and has not affected her motivation towards work. Denies any other depressive s/s. Will therefore continue to monitor and advised pt to contact office if she experiences any worsening in s/s.  Continue Wellbutrin XL and Sertraline for depression. Continue Vyvanse for ADHD. Pt to f/u in 3 months or sooner if clinically indicated. Patient advised to contact office with any questions, adverse effects, or acute worsening in signs and symptoms.  Mahelet was seen today for follow-up.  Diagnoses and all orders for this visit:  Recurrent major depressive disorder, in full remission ([place]) -     buPROPion (WELLBUTRIN XL) 300 MG 24 hr tablet; Take 1 tablet (300 mg total) by mouth daily. -     sertraline (ZOLOFT) 100 MG tablet; Take 1 tablet (100 mg total) by mouth daily.  Attention deficit hyperactivity disorder (ADHD), predominantly inattentive type -     lisdexamfetamine (VYVANSE) 50 MG capsule; Take 1 capsule (50 mg total) by mouth daily. -     lisdexamfetamine (VYVANSE) 50 MG capsule; Take 1 capsule (50 mg total) by mouth daily. -     lisdexamfetamine (VYVANSE) 50 MG capsule; Take 1 capsule (50 mg total) by mouth daily.    Please see After Visit Summary for patient specific instructions.  Future Appointments  Date Time Provider [place]  06/18/2019  8:20 AM Horald Pollen, MD PCP-[facility]    No orders  of the defined types were placed in this encounter.     -------------------------------

## 2019-04-24 ENCOUNTER — Other Ambulatory Visit: Payer: Self-pay | Admitting: Psychiatry

## 2019-04-24 DIAGNOSIS — F3342 Major depressive disorder, recurrent, in full remission: Secondary | ICD-10-CM

## 2019-05-30 ENCOUNTER — Ambulatory Visit (INDEPENDENT_AMBULATORY_CARE_PROVIDER_SITE_OTHER): Payer: 59 | Admitting: Psychiatry

## 2019-05-30 ENCOUNTER — Encounter: Payer: Self-pay | Admitting: Psychiatry

## 2019-05-30 DIAGNOSIS — F900 Attention-deficit hyperactivity disorder, predominantly inattentive type: Secondary | ICD-10-CM | POA: Diagnosis not present

## 2019-05-30 DIAGNOSIS — F3342 Major depressive disorder, recurrent, in full remission: Secondary | ICD-10-CM | POA: Diagnosis not present

## 2019-05-30 MED ORDER — LISDEXAMFETAMINE DIMESYLATE 50 MG PO CAPS: 50.0000 mg | ORAL_CAPSULE | Freq: Every day | ORAL | 0 refills | Status: DC

## 2019-05-30 NOTE — [facility] Notes (Signed)
Taylor Powers [id number] 09/09/1970 49 y.o.  Virtual Visit via Telephone Note  I connected with pt on 05/30/19 at  8:30 AM EST by telephone and verified that I am speaking with the correct person using two identifiers.   I discussed the limitations, risks, security and privacy concerns of performing an evaluation and management service by telephone and the availability of in person appointments. I also discussed with the patient that there may be a patient responsible charge related to this service. The patient expressed understanding and agreed to proceed.   I discussed the assessment and treatment plan with the patient. The patient was provided an opportunity to ask questions and all were answered. The patient agreed with the plan and demonstrated an understanding of the instructions.   The patient was advised to call back or seek an in-person evaluation if the symptoms worsen or if the condition fails to improve as anticipated.  I provided 15 minutes of non-face-to-face time during this encounter.  The patient was located at home.  The provider was located at [place].   Thayer Headings, PMHNP   Subjective:   Patient ID:  Taylor Powers is a 49 y.o. (DOB 1970/10/12) female.  Chief Complaint:  Chief Complaint  Patient presents with  . Follow-up    ADD, h/o depression    HPI Taylor Powers presents for follow-up of ADD, Depression, and anxiety. She reports that her motivation is lower than she would like and is trying to maintain a consistent routine. Denies depressed mood or anxiety. Denies any affective dulling.  Energy has been ok. Sleeping well. Appetite is stable. She reports that concentration is improved all day when she takes Vyvanse. Denies SI.   She reports that she continues to work from home. She has been trying to walk around more after work.    Past Psychiatric Medication Trials: Lexapro- Took after her divorce Wellbutrin XL Vyvanse- Effective Sertraline-  100 mg is effective. Noticed some affective dulling at 150 mg  Review of Systems:  Review of Systems  Cardiovascular: Negative for palpitations.  Musculoskeletal: Negative for gait problem.  Neurological: Negative for tremors.  Psychiatric/Behavioral:       Please refer to HPI    Medications: I have reviewed the patient's current medications.  Current Outpatient Medications  Medication Sig Dispense Refill  . buPROPion (WELLBUTRIN XL) 300 MG 24 hr tablet TAKE 1 TABLET BY MOUTH EVERY DAY 90 tablet 0  . calcium-vitamin D (OSCAL WITH D) 250-125 MG-UNIT tablet Take 1 tablet by mouth daily.    Marland Kitchen ibuprofen (ADVIL,MOTRIN) 200 MG tablet Take 600 mg by mouth daily as needed for headache.    Derrill Memo ON 07/25/2019] lisdexamfetamine (VYVANSE) 50 MG capsule Take 1 capsule (50 mg total) by mouth daily. 30 capsule 0  . Multiple Vitamin (MULTIVITAMIN WITH MINERALS) TABS tablet Take 1 tablet by mouth daily.    . Omega-3 Fatty Acids (FISH OIL) 1000 MG CAPS Take 1,000 mg by mouth 2 (two) times daily.    . sertraline (ZOLOFT) 100 MG tablet Take 1 tablet (100 mg total) by mouth daily. 90 tablet 1  . levothyroxine (SYNTHROID) 150 MCG tablet Take 1 tablet (150 mcg total) by mouth daily before breakfast. SCHEDULE OFFICE VISIT 90 tablet 3  . [START ON 06/27/2019] lisdexamfetamine (VYVANSE) 50 MG capsule Take 1 capsule (50 mg total) by mouth daily. 30 capsule 0  . lisdexamfetamine (VYVANSE) 50 MG capsule Take 1 capsule (50 mg total) by mouth daily. 30 capsule 0  . metaxalone (  SKELAXIN) 800 MG tablet Take 1 tablet (800 mg total) by mouth 3 (three) times daily as needed for muscle spasms. 60 tablet 2   No current facility-administered medications for this visit.    Medication Side Effects: None  Allergies:  Allergies  Allergen Reactions  . Sulfa Antibiotics Hives    All over body    Past Medical History:  Diagnosis Date  . Absence of menstruation   . Allergic rhinitis, cause unspecified   . Anxiety  state, unspecified   . Arthritis    DDD L4-5; ruptured disc; laminectomy.  . Cancer ([place]) 09/18/2010   thyroid cancer; s/p thyroidectomy total; s/p ablation.  [facility].  . Chicken pox   . Diabetes mellitus   . Dysplasia of cervix, unspecified   . Dysthymic disorder   . Hematuria, unspecified   . Hypersomnia, unspecified    night shift work  . Insomnia, unspecified   . Mixed hyperlipidemia   . Other abnormal glucose   . Other acne   . Thyroid disease   . Tobacco use disorder   . Unspecified vitamin D deficiency     Family History  Problem Relation Age of Onset  . Depression Mother   . Kidney disease Mother        R Nephrectomy kidney stones  . Irritable bowel syndrome Mother   . Diabetes Father   . Depression Father   . Cancer Maternal Grandfather   . Bipolar disorder Son   . ADD / ADHD Son   . Hypertension Paternal Grandmother   . ADD / ADHD Brother   . ADD / ADHD Brother     Social History   Socioeconomic History  . Marital status: Married    Spouse name: Dawayne Cirri  . Number of children: 3  . Years of education: Not on file  . Highest education level: Not on file  Occupational History  . Occupation: Psych ED    Employer: [employer]  . Occupation: Programmer, multimedia: [employer]  Tobacco Use  . Smoking status: Former Smoker    Packs/day: 0.50    Years: 27.00    Pack years: 13.50    Types: Cigarettes  . Smokeless tobacco: Never Used  Substance and Sexual Activity  . Alcohol use: No    Alcohol/week: 0.0 standard drinks  . Drug use: No  . Sexual activity: Yes    Comment: same sexual partner  Other Topics Concern  . Not on file  Social History Narrative   Marital status:  Married same sex partner 12/2013 Dawayne Cirri. Happy, no abuse.      Children:  3 children (24, 22, 20); 3 grandchildren.        Lives: with wife.      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  Two sixteen hour shifts followed by 8 hour  shift; weekends.  Off Mon-Thurs.  [place] job.  Graduate school in 2019.      Tobacco: 1 ppd x 30 years.        Alcohol: none; partner/wife in recovery.      Drugs: none      Exercise: none      Caffeine use: diet Pepsi and coffee.      Always uses seat belts.       Smoke alarm and carbon monoxide detector in the home.       No guns.    Social Determinants of Health   Financial Resource Strain:   .  Difficulty of Paying Living Expenses:   Food Insecurity:   . Worried About Charity fundraiser in the Last Year:   . Arboriculturist in the Last Year:   Transportation Needs:   . Film/video editor (Medical):   Marland Kitchen Lack of Transportation (Non-Medical):   Physical Activity:   . Days of Exercise per Week:   . Minutes of Exercise per Session:   Stress:   . Feeling of Stress :   Social Connections:   . Frequency of Communication with Friends and Family:   . Frequency of Social Gatherings with Friends and Family:   . Attends Religious Services:   . Active Member of Clubs or Organizations:   . Attends Archivist Meetings:   Marland Kitchen Marital Status:   Intimate Partner Violence:   . Fear of Current or Ex-Partner:   . Emotionally Abused:   Marland Kitchen Physically Abused:   . Sexually Abused:     Past Medical History, Surgical history, Social history, and Family history were reviewed and updated as appropriate.   Please see review of systems for further details on the patient's review from today.   Objective:   Physical Exam:  BP 132/78   Pulse 88   Wt 236 lb (107 kg)   LMP 03/05/2013   BMI 35.88 kg/m   Physical Exam Constitutional:      General: She is not in acute distress. Musculoskeletal:        General: No deformity.  Neurological:     Mental Status: She is alert and oriented to person, place, and time.     Cranial Nerves: No dysarthria.     Coordination: Coordination normal.  Psychiatric:        Attention and Perception: Attention and perception normal. She does not  perceive auditory or visual hallucinations.        Mood and Affect: Mood normal. Mood is not anxious or depressed. Affect is not labile, blunt, angry or inappropriate.        Speech: Speech normal.        Behavior: Behavior normal. Behavior is cooperative.        Thought Content: Thought content normal. Thought content is not paranoid or delusional. Thought content does not include homicidal or suicidal ideation. Thought content does not include homicidal or suicidal plan.        Cognition and Memory: Cognition and memory normal.        Judgment: Judgment normal.     Comments: Insight intact     Lab Review:     Component Value Date/Time   NA 141 12/18/2018 0937   K 4.7 12/18/2018 0937   CL 105 12/18/2018 0937   CO2 22 12/18/2018 0937   GLUCOSE 104 (H) 12/18/2018 0937   GLUCOSE 102 (H) 11/24/2015 1519   BUN 14 12/18/2018 0937   CREATININE 0.97 12/18/2018 0937   CREATININE 0.77 11/24/2015 1519   CALCIUM 9.7 12/18/2018 0937   PROT 7.2 12/18/2018 0937   ALBUMIN 4.5 12/18/2018 0937   AST 18 12/18/2018 0937   ALT 29 12/18/2018 0937   ALKPHOS 102 12/18/2018 0937   BILITOT 0.4 12/18/2018 0937   GFRNONAA 69 12/18/2018 0937   GFRNONAA >89 06/17/2013 0909   GFRAA 80 12/18/2018 0937   GFRAA >89 06/17/2013 0909       Component Value Date/Time   WBC 4.5 12/18/2018 0937   WBC 5.7 11/24/2015 1532   WBC 6.7 10/14/2014 0820   RBC 4.24 12/18/2018 [id number]  RBC 4.26 11/24/2015 1532   RBC 4.63 10/14/2014 0820   HGB 12.4 12/18/2018 0937   HCT 36.9 12/18/2018 0937   PLT 188 12/18/2018 0937   MCV 87 12/18/2018 0937   MCH 29.2 12/18/2018 0937   MCH 30.0 11/24/2015 1532   MCH 28.5 10/14/2014 0820   MCHC 33.6 12/18/2018 0937   MCHC 35.4 11/24/2015 1532   MCHC 34.1 10/14/2014 0820   RDW 12.9 12/18/2018 0937   LYMPHSABS 1.9 12/18/2018 0937   MONOABS 0.4 10/14/2014 0820   EOSABS 0.1 12/18/2018 0937   BASOSABS 0.0 12/18/2018 0937    No results found for: POCLITH, LITHIUM   No results  found for: PHENYTOIN, PHENOBARB, VALPROATE, CBMZ   .res Assessment: Plan:   Will continue current plan of care since target signs and symptoms are well controlled without any tolerability issues. Continue Sertraline 100 mg po qd for mood and anxiety.  Continue Wellbutrin XL 300 mg po qd for depression.  Pt reports that she does not need refills on Sertraline or Wellbutrin XL at this time and will request refills if needed.  Continue Vyvanse 50 mg po qd for ADD. Pt to f/u in 3 months or sooner if clinically indicated.  Patient advised to contact office with any questions, adverse effects, or acute worsening in signs and symptoms.  Ziaira was seen today for follow-up.  Diagnoses and all orders for this visit:  Attention deficit hyperactivity disorder (ADHD), predominantly inattentive type -     lisdexamfetamine (VYVANSE) 50 MG capsule; Take 1 capsule (50 mg total) by mouth daily. -     lisdexamfetamine (VYVANSE) 50 MG capsule; Take 1 capsule (50 mg total) by mouth daily. -     lisdexamfetamine (VYVANSE) 50 MG capsule; Take 1 capsule (50 mg total) by mouth daily.  Recurrent major depressive disorder, in full remission [facility])    Please see After Visit Summary for patient specific instructions.  No future appointments.  No orders of the defined types were placed in this encounter.     -------------------------------

## 2019-06-18 ENCOUNTER — Ambulatory Visit: Payer: 59 | Admitting: Emergency Medicine

## 2019-09-04 ENCOUNTER — Telehealth (INDEPENDENT_AMBULATORY_CARE_PROVIDER_SITE_OTHER): Payer: 59 | Admitting: Psychiatry

## 2019-09-04 ENCOUNTER — Encounter: Payer: Self-pay | Admitting: Psychiatry

## 2019-09-04 ENCOUNTER — Telehealth: Payer: Self-pay | Admitting: Psychiatry

## 2019-09-04 DIAGNOSIS — F3342 Major depressive disorder, recurrent, in full remission: Secondary | ICD-10-CM

## 2019-09-04 DIAGNOSIS — F900 Attention-deficit hyperactivity disorder, predominantly inattentive type: Secondary | ICD-10-CM | POA: Diagnosis not present

## 2019-09-04 MED ORDER — BUPROPION HCL ER (XL) 300 MG PO TB24: 300.0000 mg | ORAL_TABLET | Freq: Every day | ORAL | 1 refills | Status: DC

## 2019-09-04 MED ORDER — LISDEXAMFETAMINE DIMESYLATE 50 MG PO CAPS: 50.0000 mg | ORAL_CAPSULE | Freq: Every day | ORAL | 0 refills | Status: DC

## 2019-09-04 MED ORDER — SERTRALINE HCL 100 MG PO TABS: 100.0000 mg | ORAL_TABLET | Freq: Every day | ORAL | 1 refills | Status: DC

## 2019-09-04 NOTE — Progress Notes (Signed)
Taylor Powers [id number] 12-Oct-1970 49 y.o.  Virtual Visit via Video Note  I connected with pt @ on 09/04/19 at 11:00 AM EDT by a video enabled telemedicine application and verified that I am speaking with the correct person using two identifiers.   I discussed the limitations of evaluation and management by telemedicine and the availability of in person appointments. The patient expressed understanding and agreed to proceed.  I discussed the assessment and treatment plan with the patient. The patient was provided an opportunity to ask questions and all were answered. The patient agreed with the plan and demonstrated an understanding of the instructions.   The patient was advised to call back or seek an in-person evaluation if the symptoms worsen or if the condition fails to improve as anticipated.  I provided 15 minutes of non-face-to-face time during this encounter.  The patient was located at home.  The provider was located at [place].   Thayer Headings, PMHNP   Subjective:   Patient ID:  Taylor Powers is a 49 y.o. (DOB April 10, 1970) female.  Chief Complaint:  Chief Complaint  Patient presents with  . Follow-up    AD, h/o depression and anxiety.     HPI [name] presents for follow-up of h/o depression, anxiety, and ADD. Sleeping well. She reports anxiety has been manageable. She reports that her mood has been "fine." She reports that she takes Vyvanse about 4 days a week and that it is effective for her concentration and motivation and motivation is lower on the days she does not take Vyvanse. She reports that her energy has been good. No change in appetite. She reports that she has not had any weight gain. Denies anhedonia. Denies SI.   She reports that she had some work stress with changing EMR. Returning to the office July 1st. Had first child at 40 yo.    Review of Systems:  Review of Systems  Cardiovascular: Negative for palpitations.  Musculoskeletal:  Negative for gait problem.  Neurological: Negative for tremors.  Psychiatric/Behavioral:       Please refer to HPI    Medications: I have reviewed the patient's current medications.  Current Outpatient Medications  Medication Sig Dispense Refill  . buPROPion (WELLBUTRIN XL) 300 MG 24 hr tablet Take 1 tablet (300 mg total) by mouth daily. 90 tablet 1  . calcium-vitamin D (OSCAL WITH D) 250-125 MG-UNIT tablet Take 1 tablet by mouth daily.    Marland Kitchen ibuprofen (ADVIL,MOTRIN) 200 MG tablet Take 600 mg by mouth daily as needed for headache.    Derrill Memo ON 10/30/2019] lisdexamfetamine (VYVANSE) 50 MG capsule Take 1 capsule (50 mg total) by mouth daily. 30 capsule 0  . Multiple Vitamin (MULTIVITAMIN WITH MINERALS) TABS tablet Take 1 tablet by mouth daily.    . Omega-3 Fatty Acids (FISH OIL) 1000 MG CAPS Take 1,000 mg by mouth 2 (two) times daily.    . sertraline (ZOLOFT) 100 MG tablet Take 1 tablet (100 mg total) by mouth daily. 90 tablet 1  . levothyroxine (SYNTHROID) 150 MCG tablet Take 1 tablet (150 mcg total) by mouth daily before breakfast. SCHEDULE OFFICE VISIT 90 tablet 3  . [START ON 10/02/2019] lisdexamfetamine (VYVANSE) 50 MG capsule Take 1 capsule (50 mg total) by mouth daily. 30 capsule 0  . lisdexamfetamine (VYVANSE) 50 MG capsule Take 1 capsule (50 mg total) by mouth daily. 30 capsule 0  . metaxalone (SKELAXIN) 800 MG tablet Take 1 tablet (800 mg total) by mouth 3 (three) times daily  as needed for muscle spasms. (Patient not taking: Reported on 09/04/2019) 60 tablet 2   No current facility-administered medications for this visit.    Medication Side Effects: None  Allergies:  Allergies  Allergen Reactions  . Sulfa Antibiotics Hives    All over body    Past Medical History:  Diagnosis Date  . Absence of menstruation   . Allergic rhinitis, cause unspecified   . Anxiety state, unspecified   . Arthritis    DDD L4-5; ruptured disc; laminectomy.  . Cancer ([place]) 09/18/2010   thyroid  cancer; s/p thyroidectomy total; s/p ablation.  [place].  . Chicken pox   . Diabetes mellitus   . Dysplasia of cervix, unspecified   . Dysthymic disorder   . Hematuria, unspecified   . Hypersomnia, unspecified    night shift work  . Insomnia, unspecified   . Mixed hyperlipidemia   . Other abnormal glucose   . Other acne   . Thyroid disease   . Tobacco use disorder   . Unspecified vitamin D deficiency     Family History  Problem Relation Age of Onset  . Depression Mother   . Kidney disease Mother        R Nephrectomy kidney stones  . Irritable bowel syndrome Mother   . Diabetes Father   . Depression Father   . Cancer Maternal Grandfather   . Bipolar disorder Son   . ADD / ADHD Son   . Hypertension Paternal Grandmother   . ADD / ADHD Brother   . ADD / ADHD Brother     Social History   Socioeconomic History  . Marital status: Married    Spouse name: Dawayne Cirri  . Number of children: 3  . Years of education: Not on file  . Highest education level: Not on file  Occupational History  . Occupation: Psych ED    Employer: [employer]  . Occupation: Programmer, multimedia: [employer]  Tobacco Use  . Smoking status: Former Smoker    Packs/day: 0.50    Years: 27.00    Pack years: 13.50    Types: Cigarettes  . Smokeless tobacco: Never Used  Substance and Sexual Activity  . Alcohol use: No    Alcohol/week: 0.0 standard drinks  . Drug use: No  . Sexual activity: Yes    Comment: same sexual partner  Other Topics Concern  . Not on file  Social History Narrative   Marital status:  Married same sex partner 12/2013 Dawayne Cirri. Happy, no abuse.      Children:  3 children (24, 22, 20); 3 grandchildren.        Lives: with wife.      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  Two sixteen hour shifts followed by 8 hour shift; weekends.  Off Mon-Thurs.  [employer] job.  Graduate school in 2019.      Tobacco: 1 ppd x 30 years.         Alcohol: none; partner/wife in recovery.      Drugs: none      Exercise: none      Caffeine use: diet Pepsi and coffee.      Always uses seat belts.       Smoke alarm and carbon monoxide detector in the home.       No guns.    Social Determinants of Health   Financial Resource Strain:   . Difficulty of Paying Living Expenses:   Food  Insecurity:   . Worried About Charity fundraiser in the Last Year:   . Arboriculturist in the Last Year:   Transportation Needs:   . Film/video editor (Medical):   Marland Kitchen Lack of Transportation (Non-Medical):   Physical Activity:   . Days of Exercise per Week:   . Minutes of Exercise per Session:   Stress:   . Feeling of Stress :   Social Connections:   . Frequency of Communication with Friends and Family:   . Frequency of Social Gatherings with Friends and Family:   . Attends Religious Services:   . Active Member of Clubs or Organizations:   . Attends Archivist Meetings:   Marland Kitchen Marital Status:   Intimate Partner Violence:   . Fear of Current or Ex-Partner:   . Emotionally Abused:   Marland Kitchen Physically Abused:   . Sexually Abused:     Past Medical History, Surgical history, Social history, and Family history were reviewed and updated as appropriate.   Please see review of systems for further details on the patient's review from today.   Objective:   Physical Exam:  Wt 235 lb (106.6 kg)   LMP 03/05/2013   BMI 35.73 kg/m   Physical Exam Neurological:     Mental Status: She is alert and oriented to person, place, and time.     Cranial Nerves: No dysarthria.  Psychiatric:        Attention and Perception: Attention and perception normal.        Mood and Affect: Mood normal.        Speech: Speech normal.        Behavior: Behavior is cooperative.        Thought Content: Thought content normal. Thought content is not paranoid or delusional. Thought content does not include homicidal or suicidal ideation. Thought content does not include  homicidal or suicidal plan.        Cognition and Memory: Cognition and memory normal.        Judgment: Judgment normal.     Comments: Insight intact     Lab Review:     Component Value Date/Time   NA 141 12/18/2018 0937   K 4.7 12/18/2018 0937   CL 105 12/18/2018 0937   CO2 22 12/18/2018 0937   GLUCOSE 104 (H) 12/18/2018 0937   GLUCOSE 102 (H) 11/24/2015 1519   BUN 14 12/18/2018 0937   CREATININE 0.97 12/18/2018 0937   CREATININE 0.77 11/24/2015 1519   CALCIUM 9.7 12/18/2018 0937   PROT 7.2 12/18/2018 0937   ALBUMIN 4.5 12/18/2018 0937   AST 18 12/18/2018 0937   ALT 29 12/18/2018 0937   ALKPHOS 102 12/18/2018 0937   BILITOT 0.4 12/18/2018 0937   GFRNONAA 69 12/18/2018 0937   GFRNONAA >89 06/17/2013 0909   GFRAA 80 12/18/2018 0937   GFRAA >89 06/17/2013 0909       Component Value Date/Time   WBC 4.5 12/18/2018 0937   WBC 5.7 11/24/2015 1532   WBC 6.7 10/14/2014 0820   RBC 4.24 12/18/2018 0937   RBC 4.26 11/24/2015 1532   RBC 4.63 10/14/2014 0820   HGB 12.4 12/18/2018 0937   HCT 36.9 12/18/2018 0937   PLT 188 12/18/2018 0937   MCV 87 12/18/2018 0937   MCH 29.2 12/18/2018 0937   MCH 30.0 11/24/2015 1532   MCH 28.5 10/14/2014 0820   MCHC 33.6 12/18/2018 0937   MCHC 35.4 11/24/2015 1532   MCHC 34.1 10/14/2014 0820  RDW 12.9 12/18/2018 0937   LYMPHSABS 1.9 12/18/2018 0937   MONOABS 0.4 10/14/2014 0820   EOSABS 0.1 12/18/2018 0937   BASOSABS 0.0 12/18/2018 0937    No results found for: POCLITH, LITHIUM   No results found for: PHENYTOIN, PHENOBARB, VALPROATE, CBMZ   .res Assessment: Plan:   Will continue current plan of care since target signs and symptoms are well controlled without any tolerability issues. Continue Wellbutrin XL 300 mg daily for depression. Continue sertraline 100 mg daily for anxiety and depression. Continue Vyvanse 50 mg daily for ADHD. Patient to follow-up in 3 months or sooner if clinically indicated. Patient advised to contact  office with any questions, adverse effects, or acute worsening in signs and symptoms.  Alayja was seen today for follow-up.  Diagnoses and all orders for this visit:  Attention deficit hyperactivity disorder (ADHD), predominantly inattentive type -     lisdexamfetamine (VYVANSE) 50 MG capsule; Take 1 capsule (50 mg total) by mouth daily. -     lisdexamfetamine (VYVANSE) 50 MG capsule; Take 1 capsule (50 mg total) by mouth daily. -     lisdexamfetamine (VYVANSE) 50 MG capsule; Take 1 capsule (50 mg total) by mouth daily.  Recurrent major depressive disorder, in full remission ([place]) -     buPROPion (WELLBUTRIN XL) 300 MG 24 hr tablet; Take 1 tablet (300 mg total) by mouth daily. -     sertraline (ZOLOFT) 100 MG tablet; Take 1 tablet (100 mg total) by mouth daily.     Please see After Visit Summary for patient specific instructions.  No future appointments.  No orders of the defined types were placed in this encounter.     -------------------------------

## 2019-09-04 NOTE — Telephone Encounter (Signed)
Ms. Taylor Powers, Taylor Powers are scheduled for a virtual visit with your provider today.    Just as we do with appointments in the office, we must obtain your consent to participate.  Your consent will be active for this visit and any virtual visit you may have with one of our providers in the next 365 days.    If you have a MyChart account, I can also send a copy of this consent to you electronically.  All virtual visits are billed to your insurance company just like a traditional visit in the office.  As this is a virtual visit, video technology does not allow for your provider to perform a traditional examination.  This may limit your provider's ability to fully assess your condition.  If your provider identifies any concerns that need to be evaluated in person or the need to arrange testing such as labs, EKG, etc, we will make arrangements to do so.    Although advances in technology are sophisticated, we cannot ensure that it will always work on either your end or our end.  If the connection with a video visit is poor, we may have to switch to a telephone visit.  With either a video or telephone visit, we are not always able to ensure that we have a secure connection.   I need to obtain your verbal consent now.   Are you willing to proceed with your visit today?   Taylor Powers has provided verbal consent on 09/04/2019 for a virtual visit (video or telephone).   Thayer Headings, PMHNP 09/04/2019  11:17 AM

## 2019-12-22 ENCOUNTER — Encounter: Payer: Self-pay | Admitting: Psychiatry

## 2019-12-22 ENCOUNTER — Telehealth (INDEPENDENT_AMBULATORY_CARE_PROVIDER_SITE_OTHER): Payer: 59 | Admitting: Psychiatry

## 2019-12-22 ENCOUNTER — Other Ambulatory Visit: Payer: Self-pay | Admitting: Emergency Medicine

## 2019-12-22 DIAGNOSIS — E890 Postprocedural hypothyroidism: Secondary | ICD-10-CM

## 2019-12-22 DIAGNOSIS — F900 Attention-deficit hyperactivity disorder, predominantly inattentive type: Secondary | ICD-10-CM | POA: Diagnosis not present

## 2019-12-22 DIAGNOSIS — F3342 Major depressive disorder, recurrent, in full remission: Secondary | ICD-10-CM

## 2019-12-22 MED ORDER — LISDEXAMFETAMINE DIMESYLATE 50 MG PO CAPS: 50.0000 mg | ORAL_CAPSULE | Freq: Every day | ORAL | 0 refills | Status: DC

## 2019-12-22 NOTE — Telephone Encounter (Signed)
Requested medication (s) are due for refill today: yes  Requested medication (s) are on the active medication list: yes  Last refill:  12/18/18  Future visit scheduled: no  Notes to clinic:  prescription expired 03/18/19   Requested Prescriptions  Pending Prescriptions Disp Refills   levothyroxine (SYNTHROID) 150 MCG tablet [Pharmacy Med Name: LEVOTHYROXINE 150 MCG TABLET] 90 tablet 3    Sig: Take 1 tablet (150 mcg total) by mouth daily before breakfast. SCHEDULE OFFICE VISIT      Endocrinology:  Hypothyroid Agents Failed - 12/22/2019 11:19 AM      Failed - TSH needs to be rechecked within 3 months after an abnormal result. Refill until TSH is due.      Failed - TSH in normal range and within 360 days    TSH  Date Value Ref Range Status  12/18/2018 0.643 0.450 - 4.500 uIU/mL Final          Failed - Valid encounter within last 12 months    Recent Outpatient Visits           1 year ago Postsurgical hypothyroidism   [facility] Ines Bloomer, MD   1 year ago Upper abdominal pain   [facility] at [place], [place], MD   2 years ago Glucose intolerance (impaired glucose tolerance)   [facility] at [facility], Renette Butters, MD   2 years ago Routine physical examination   [facility] at [facility], Renette Butters, MD   3 years ago Depression with anxiety   Primary Care at [facility], Renette Butters, MD

## 2019-12-22 NOTE — Progress Notes (Signed)
Taylor Powers [id number] 01/13/1971 49 y.o.  Virtual Visit via Video Note  I connected with pt @ on 12/22/19 at  9:00 AM EDT by a video enabled telemedicine application and verified that I am speaking with the correct person using two identifiers.   I discussed the limitations of evaluation and management by telemedicine and the availability of in person appointments. The patient expressed understanding and agreed to proceed.  I discussed the assessment and treatment plan with the patient. The patient was provided an opportunity to ask questions and all were answered. The patient agreed with the plan and demonstrated an understanding of the instructions.   The patient was advised to call back or seek an in-person evaluation if the symptoms worsen or if the condition fails to improve as anticipated.  I provided 15 minutes of non-face-to-face time during this encounter.  The patient was located at home.  The provider was located at [place].   Thayer Headings, PMHNP   Subjective:   Patient ID:  Taylor Powers is a 49 y.o. (DOB [birth year]) female.  Chief Complaint:  Chief Complaint  Patient presents with   ADD   Follow-up    Anxiety and Depression    HPI Taylor Powers presents for follow-up of ADD and h/o anxiety and depression. She reports that she has been feeling, "way better" and has increased self-care. She reports that she started [place] last week. Has been eliminating soda and is being more active. She reports that she stopped taking Vyvanse for about 7-8 days and then re-started it and it seems to be more effective now. Appetite has been good. She reports her energy and motivation have improved. Denies depressed mood or anxiety. She reports significant improvement in irritability. She reports that she is sleeping well and averages about 8 hours a night. Awakening once to urinate and is able to immediately return to sleep. Denies SI.   Works in the office Tuesdays and  Thursdays. Works from home Mondays and Wednesdays.   Past Psychiatric Medication Trials: Lexapro- Took after her divorce Wellbutrin XL Vyvanse- Effective Sertraline- 100 mg is effective. Noticed some affective dulling at 150 mg  Review of Systems:  Review of Systems  Cardiovascular: Negative for palpitations.  Gastrointestinal: Negative.   Musculoskeletal: Negative for gait problem.  Neurological: Negative for tremors.  Psychiatric/Behavioral:       Please refer to HPI    Medications: I have reviewed the patient's current medications.  Current Outpatient Medications  Medication Sig Dispense Refill   buPROPion (WELLBUTRIN XL) 300 MG 24 hr tablet Take 1 tablet (300 mg total) by mouth daily. 90 tablet 1   calcium-vitamin D (OSCAL WITH D) 250-125 MG-UNIT tablet Take 1 tablet by mouth daily.     ibuprofen (ADVIL,MOTRIN) 200 MG tablet Take 600 mg by mouth daily as needed for headache.     [START ON 01/19/2020] lisdexamfetamine (VYVANSE) 50 MG capsule Take 1 capsule (50 mg total) by mouth daily. 30 capsule 0   Omega-3 Fatty Acids (FISH OIL) 1000 MG CAPS Take 1,000 mg by mouth 2 (two) times daily.     sertraline (ZOLOFT) 100 MG tablet Take 1 tablet (100 mg total) by mouth daily. 90 tablet 1   levothyroxine (SYNTHROID) 150 MCG tablet Take 1 tablet (150 mcg total) by mouth daily before breakfast. SCHEDULE OFFICE VISIT 90 tablet 3   lisdexamfetamine (VYVANSE) 50 MG capsule Take 1 capsule (50 mg total) by mouth daily. 30 capsule 0   [START ON 02/16/2020] lisdexamfetamine (VYVANSE) 50  MG capsule Take 1 capsule (50 mg total) by mouth daily. 30 capsule 0   Multiple Vitamin (MULTIVITAMIN WITH MINERALS) TABS tablet Take 1 tablet by mouth daily. (Patient not taking: Reported on 12/22/2019)     No current facility-administered medications for this visit.    Medication Side Effects: None  Allergies:  Allergies  Allergen Reactions   Sulfa Antibiotics Hives    All over body    Past  Medical History:  Diagnosis Date   Absence of menstruation    Allergic rhinitis, cause unspecified    Anxiety state, unspecified    Arthritis    DDD L4-5; ruptured disc; laminectomy.   Cancer ([place]) 09/18/2010   thyroid cancer; s/p thyroidectomy total; s/p ablation.  [facility].   Chicken pox    Diabetes mellitus    Dysplasia of cervix, unspecified    Dysthymic disorder    Hematuria, unspecified    Hypersomnia, unspecified    night shift work   Insomnia, unspecified    Mixed hyperlipidemia    Other abnormal glucose    Other acne    Thyroid disease    Tobacco use disorder    Unspecified vitamin D deficiency     Family History  Problem Relation Age of Onset   Depression Mother    Kidney disease Mother        R Nephrectomy kidney stones   Irritable bowel syndrome Mother    Diabetes Father    Depression Father    Cancer Maternal Grandfather    Bipolar disorder Son    ADD / ADHD Son    Hypertension Paternal Grandmother    ADD / ADHD Brother    ADD / ADHD Brother     Social History   Socioeconomic History   Marital status: Married    Spouse name: Dawayne Cirri   Number of children: 3   Years of education: Not on file   Highest education level: Not on file  Occupational History   Occupation: Psych ED    Employer: [employer]   Occupation: Programmer, 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 Happy, no abuse.      Children:  3 children (24, 22, 20); 3 grandchildren.        Lives: with wife.      Employment:   [employer] Biomedical engineer; working at [employer] in [employer].  Two sixteen hour shifts followed by 8 hour shift; weekends.  Off Mon-Thurs.  [place] job.  Graduate school in 2019.      Tobacco: 1 ppd x 30 years.        Alcohol: none; partner/wife in recovery.      Drugs: none      Exercise: none      Caffeine use: diet Pepsi and coffee.      Always uses seat belts.       Smoke alarm and carbon monoxide detector in the home.       No guns.  Social Determinants of Health   Financial Resource Strain:    Difficulty of Paying Living Expenses: Not on file  Food Insecurity:    Worried About Charity fundraiser in the Last Year: Not on file   [employer] of Food in the Last Year: Not on file  Transportation Needs:    Lack of Transportation (Medical): Not on file   Lack of Transportation (Non-Medical): Not on file  Physical Activity:    Days of Exercise per Week: Not on file   Minutes of Exercise per Session: Not on file  Stress:    Feeling of Stress : Not on file  Social Connections:    Frequency of Communication with Friends and Family: Not on file   Frequency of Social Gatherings with Friends and Family: Not on file   Attends Religious Services: Not on file   Active Member of Clubs or Organizations: Not on file   Attends Archivist Meetings: Not on file   Marital Status: Not on file  Intimate Partner Violence:    Fear of Current or Ex-Partner: Not on file   Emotionally Abused: Not on file   Physically Abused: Not on file   Sexually Abused: Not on file    Past Medical History, Surgical history, Social history, and Family history were reviewed and updated as appropriate.   Please see review of systems for further details on the patient's review from today.   Objective:   Physical Exam:  BP 134/82    Pulse 92    Wt 244 lb (110.7 kg)    LMP 03/05/2013    BMI 37.10 kg/m   Physical Exam Neurological:     Mental Status: She is alert and  oriented to person, place, and time.     Cranial Nerves: No dysarthria.  Psychiatric:        Attention and Perception: Attention and perception normal.        Mood and Affect: Mood normal.        Speech: Speech normal.        Behavior: Behavior is cooperative.        Thought Content: Thought content normal. Thought content is not paranoid or delusional. Thought content does not include homicidal or suicidal ideation. Thought content does not include homicidal or suicidal plan.        Cognition and Memory: Cognition and memory normal.        Judgment: Judgment normal.     Comments: Insight intact     Lab Review:     Component Value Date/Time   NA 141 12/18/2018 0937   K 4.7 12/18/2018 0937   CL 105 12/18/2018 0937   CO2 22 12/18/2018 0937   GLUCOSE 104 (H) 12/18/2018 0937   GLUCOSE 102 (H) 11/24/2015 1519   BUN 14 12/18/2018 0937   CREATININE 0.97 12/18/2018 0937   CREATININE 0.77 11/24/2015 1519   CALCIUM 9.7 12/18/2018 0937   PROT 7.2 12/18/2018 0937   ALBUMIN 4.5 12/18/2018 0937   AST 18 12/18/2018 0937   ALT 29 12/18/2018 0937   ALKPHOS 102 12/18/2018 0937   BILITOT 0.4 12/18/2018 0937   GFRNONAA 69 12/18/2018 0937   GFRNONAA >89 06/17/2013 0909   GFRAA 80 12/18/2018 0937   GFRAA >89 06/17/2013 0909       Component Value Date/Time   WBC 4.5 12/18/2018 0937   WBC 5.7 11/24/2015 1532   WBC 6.7 10/14/2014 0820   RBC 4.24 12/18/2018 0937   RBC 4.26 11/24/2015  1532   RBC 4.63 10/14/2014 0820   HGB 12.4 12/18/2018 0937   HCT 36.9 12/18/2018 0937   PLT 188 12/18/2018 0937   MCV 87 12/18/2018 0937   MCH 29.2 12/18/2018 0937   MCH 30.0 11/24/2015 1532   MCH 28.5 10/14/2014 0820   MCHC 33.6 12/18/2018 0937   MCHC 35.4 11/24/2015 1532   MCHC 34.1 10/14/2014 0820   RDW 12.9 12/18/2018 0937   LYMPHSABS 1.9 12/18/2018 0937   MONOABS 0.4 10/14/2014 0820   EOSABS 0.1 12/18/2018 0937   BASOSABS 0.0 12/18/2018 0937    No results found for: POCLITH, LITHIUM   No  results found for: PHENYTOIN, PHENOBARB, VALPROATE, CBMZ   .res Assessment: Plan:   Will continue current plan of care since target signs and symptoms are well controlled without any tolerability issues. Continue Vyvanse 50 mg po q am for ADD. Continue Sertraline 10 0mg  daily for anxiety and depression.  Continue Wellbutrin XL 300 mg po qd for depression.  Pt to follow-up in 3 months or sooner if clinically indicated.  Patient advised to contact office with any questions, adverse effects, or acute worsening in signs and symptoms.  Hazel was seen today for add and follow-up.  Diagnoses and all orders for this visit:  Attention deficit hyperactivity disorder (ADHD), predominantly inattentive type -     lisdexamfetamine (VYVANSE) 50 MG capsule; Take 1 capsule (50 mg total) by mouth daily. -     lisdexamfetamine (VYVANSE) 50 MG capsule; Take 1 capsule (50 mg total) by mouth daily. -     lisdexamfetamine (VYVANSE) 50 MG capsule; Take 1 capsule (50 mg total) by mouth daily.  Recurrent major depressive disorder, in full remission [facility])     Please see After Visit Summary for patient specific instructions.  No future appointments.  No orders of the defined types were placed in this encounter.     -------------------------------

## 2019-12-22 NOTE — Telephone Encounter (Signed)
Called pt made a app for 12/29/19 @ 8;am

## 2019-12-22 NOTE — Telephone Encounter (Signed)
No further refills without office visit 

## 2019-12-22 NOTE — Telephone Encounter (Signed)
Please schedule appt for med refills . 90 day supply has been sent

## 2019-12-29 ENCOUNTER — Ambulatory Visit: Payer: 59 | Admitting: Emergency Medicine

## 2019-12-30 ENCOUNTER — Encounter: Payer: Self-pay | Admitting: Emergency Medicine

## 2020-01-15 IMAGING — RF DG CHOLANGIOGRAM OPERATIVE
1 series · 4 of 4 positions shown · non-contrast
Comparison: Ultrasound 04/11/2018

CLINICAL DATA: Intraoperative cholangiogram.  Cholecystectomy.

EXAM:
INTRAOPERATIVE CHOLANGIOGRAM
TECHNIQUE: Cholangiographic images from the C-arm fluoroscopic device were
submitted for interpretation post-operatively. Please see the
procedural report for the amount of contrast and the fluoroscopy
time utilized.

[Series 1: run · 4 of 44 frames shown]
[frame 7/44]
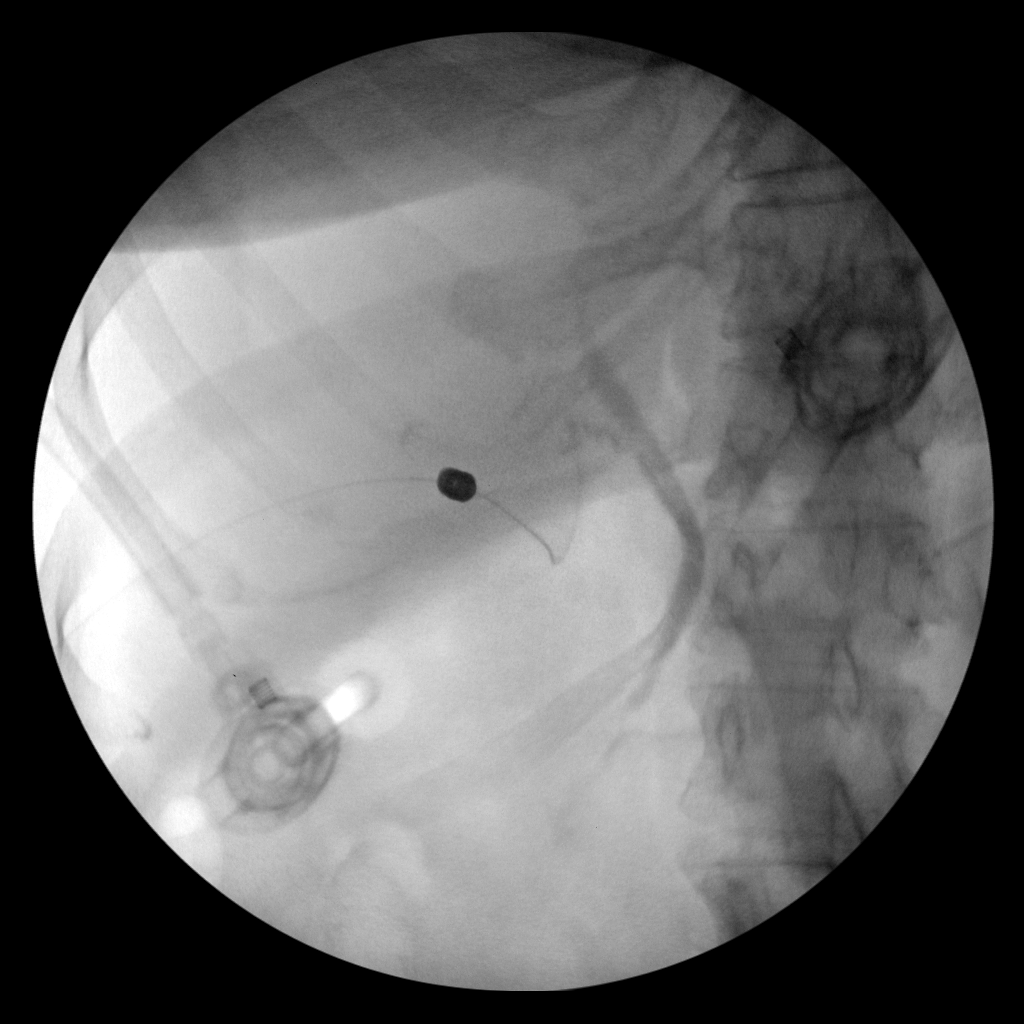
[frame 14/44]
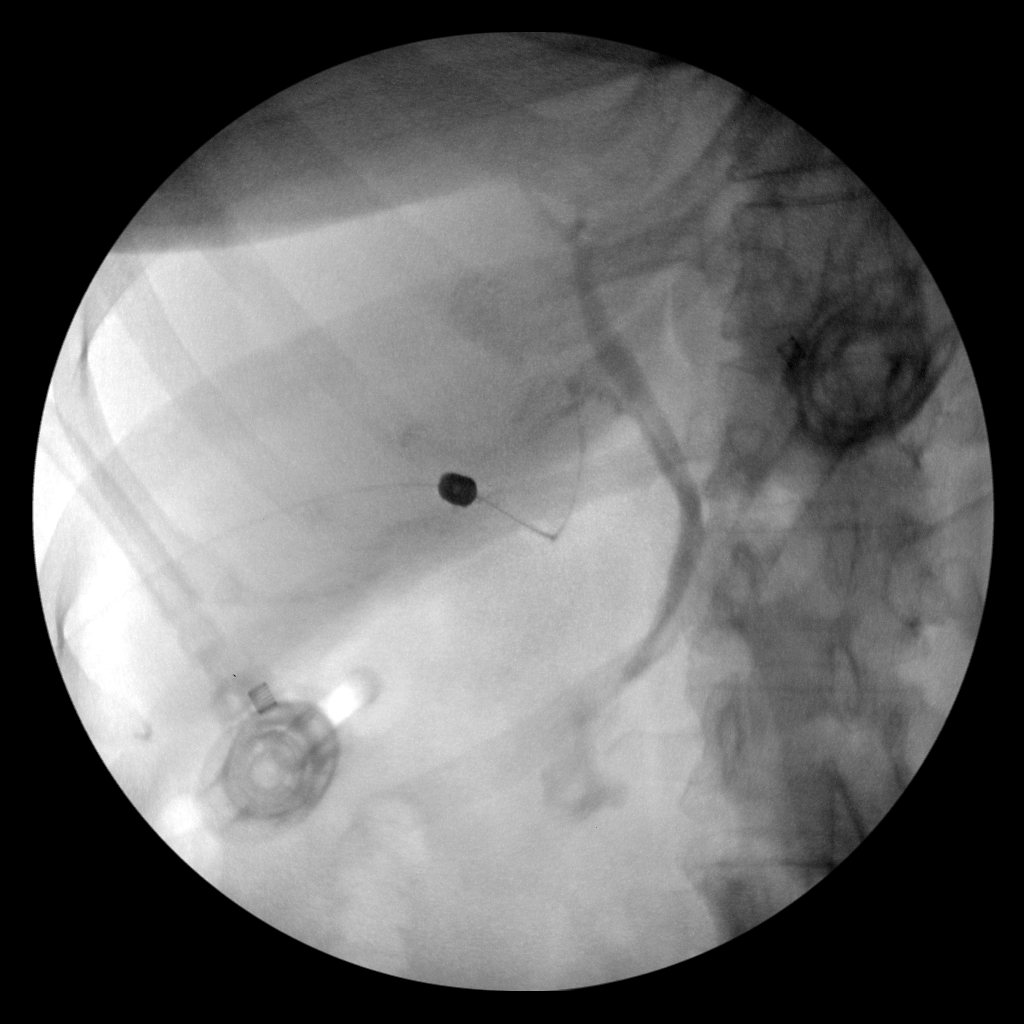
[frame 23/44]
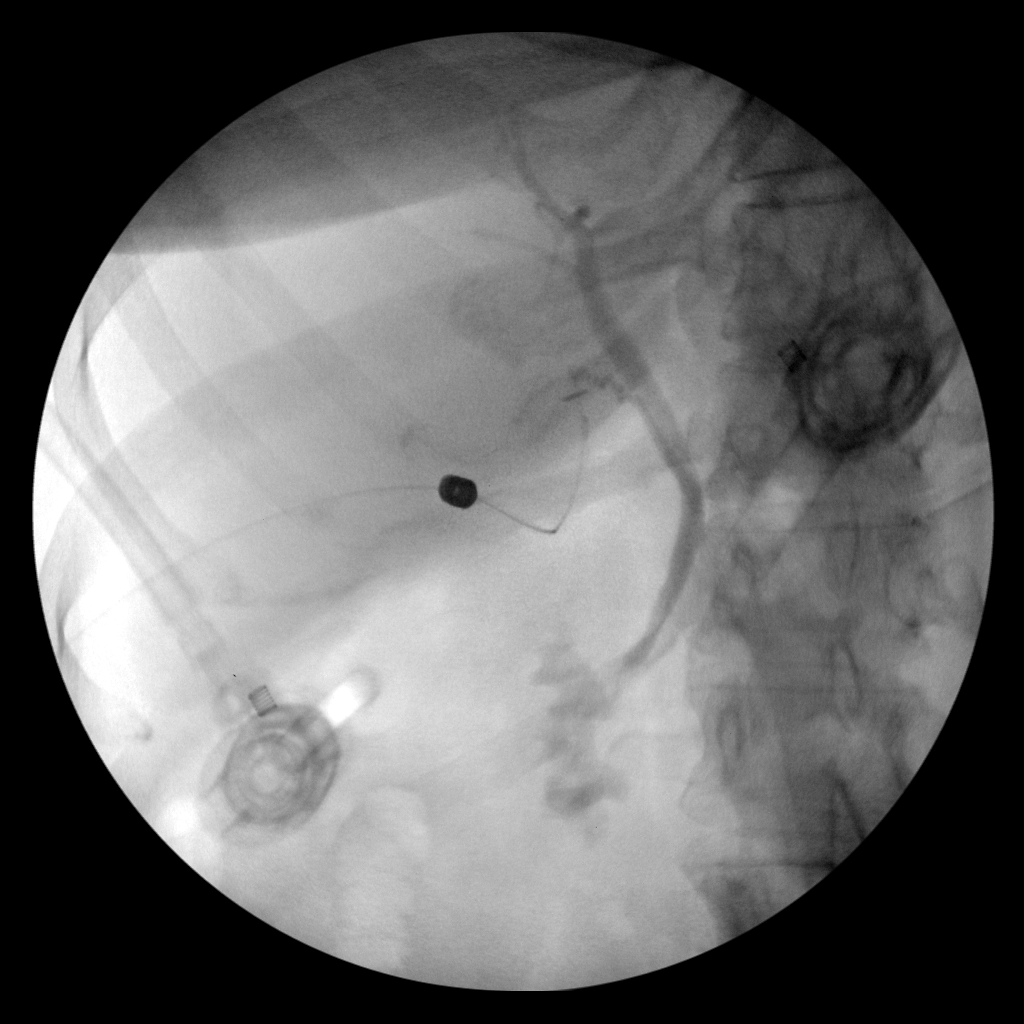
[frame 38/44]
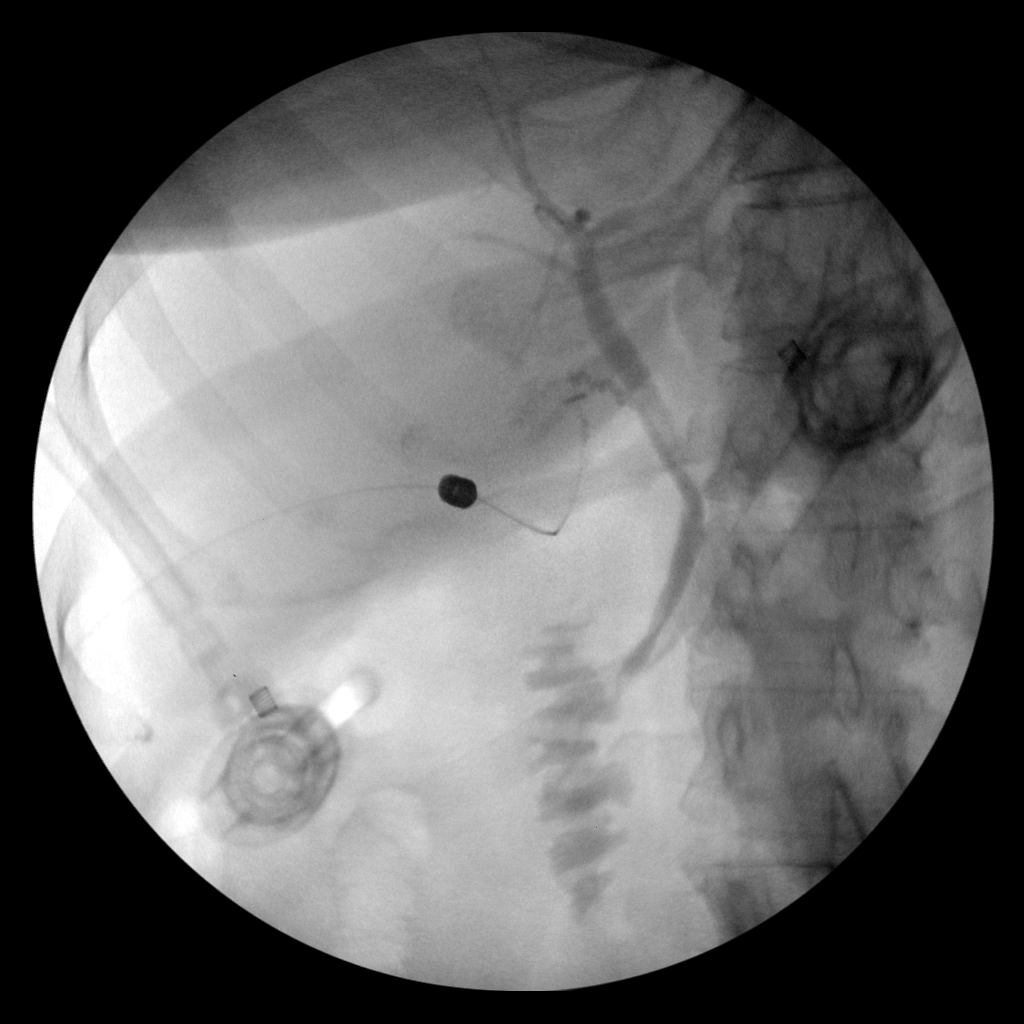

[4 of 4 positions shown; findings below may reference images not displayed]

FINDINGS: Contrast opacification of the extrahepatic biliary system. Small
amount of reflux into the central biliary ducts. The extrahepatic
biliary system is patent without filling defects or stones. Contrast
drains into the duodenum.
IMPRESSION: Normal intraoperative cholangiogram. Negative for biliary
obstruction or stones.

## 2020-03-17 ENCOUNTER — Other Ambulatory Visit: Payer: Self-pay | Admitting: Psychiatry

## 2020-03-17 DIAGNOSIS — F3342 Major depressive disorder, recurrent, in full remission: Secondary | ICD-10-CM

## 2020-04-04 ENCOUNTER — Other Ambulatory Visit: Payer: Self-pay | Admitting: Emergency Medicine

## 2020-04-04 DIAGNOSIS — E890 Postprocedural hypothyroidism: Secondary | ICD-10-CM

## 2020-04-04 NOTE — Telephone Encounter (Signed)
Requested medication (s) are due for refill today: yes  Requested medication (s) are on the active medication list: yes  Last refill: 01/10/2020  Future visit scheduled: no  Notes to clinic: overdue for appt    Requested Prescriptions  Pending Prescriptions Disp Refills   levothyroxine (SYNTHROID) 150 MCG tablet [Pharmacy Med Name: LEVOTHYROXINE 150 MCG TABLET] 90 tablet 0    Sig: TAKE 1 TABLET (150 MCG TOTAL) BY MOUTH DAILY BEFORE BREAKFAST. SCHEDULE OFFICE VISIT      Endocrinology:  Hypothyroid Agents Failed - 04/04/2020  9:32 AM      Failed - TSH needs to be rechecked within 3 months after an abnormal result. Refill until TSH is due.      Failed - TSH in normal range and within 360 days    TSH  Date Value Ref Range Status  12/18/2018 0.643 0.450 - 4.500 uIU/mL Final          Failed - Valid encounter within last 12 months    Recent Outpatient Visits           1 year ago Postsurgical hypothyroidism   Primary Care at [facility], Ines Bloomer, MD   1 year ago Upper abdominal pain   [facility] at [place], Lohman, MD   2 years ago Glucose intolerance (impaired glucose tolerance)   Primary Care at [facility], Renette Butters, MD   2 years ago Routine physical examination   Primary [facility] at [facility], Renette Butters, MD   3 years ago Depression with anxiety   Primary Care at [facility], Renette Butters, MD

## 2020-04-05 NOTE — Telephone Encounter (Signed)
Let pt know that she will need to sch a med refill appt.

## 2020-04-05 NOTE — Telephone Encounter (Signed)
rx refilled please assist in getting pt scheduled for ov.

## 2020-04-28 ENCOUNTER — Encounter: Payer: Self-pay | Admitting: Psychiatry

## 2020-04-28 ENCOUNTER — Telehealth (INDEPENDENT_AMBULATORY_CARE_PROVIDER_SITE_OTHER): Payer: BC Managed Care – PPO | Admitting: Psychiatry

## 2020-04-28 VITALS — BP 132/68 | HR 78 | Wt 193.0 lb

## 2020-04-28 DIAGNOSIS — F900 Attention-deficit hyperactivity disorder, predominantly inattentive type: Secondary | ICD-10-CM

## 2020-04-28 DIAGNOSIS — F3342 Major depressive disorder, recurrent, in full remission: Secondary | ICD-10-CM | POA: Diagnosis not present

## 2020-04-28 MED ORDER — LISDEXAMFETAMINE DIMESYLATE 40 MG PO CAPS: 50.0000 mg | ORAL_CAPSULE | Freq: Every day | ORAL | 0 refills | Status: DC

## 2020-04-28 NOTE — Progress Notes (Signed)
Taylor Powers [id number] [birth year] 50 y.o.  Virtual Visit via Video Note  I connected with pt @ on 04/28/20 at 11:00 AM EST by a video enabled telemedicine application and verified that I am speaking with the correct person using two identifiers.   I discussed the limitations of evaluation and management by telemedicine and the availability of in person appointments. The patient expressed understanding and agreed to proceed.  I discussed the assessment and treatment plan with the patient. The patient was provided an opportunity to ask questions and all were answered. The patient agreed with the plan and demonstrated an understanding of the instructions.   The patient was advised to call back or seek an in-person evaluation if the symptoms worsen or if the condition fails to improve as anticipated.  I provided 15 minutes of non-face-to-face time during this encounter.  The patient was located at home.  The provider was located at [place].   Thayer Headings, PMHNP   Subjective:   Patient ID:  Taylor Powers is a 50 y.o. (DOB Apr 20, 1970) female.  Chief Complaint:  Chief Complaint  Patient presents with  . Follow-up    Anxiety, Depression, and ADD    HPI Taylor Powers presents for follow-up of anxiety, ADD, and depression. She reports that mood has been stable. Denies anxiety. She has had 53 lb intentional weight loss. Sleeping well. She reports that she has been more physically active. Energy and motivation has significantly. She reports that her concentration has been ok. Denies SI.   She reports that she had discussion with her supervisor and her job satisfaction has improved. 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   Reports that she did not take Vyvanse for several days and then when she re-started Vyvanse 50 mg it "felt too much."   Past Psychiatric Medication Trials: Lexapro- Took after her divorce Wellbutrin XL Vyvanse-  Effective Sertraline- 100 mg is effective. Noticed some affective dulling at 150 mg  Review of Systems:  Review of Systems  Cardiovascular: Negative for palpitations.  Musculoskeletal: Negative for gait problem.  Neurological: Negative for tremors.  Psychiatric/Behavioral:       Please refer to HPI    Medications: I have reviewed the patient's current medications.  Current Outpatient Medications  Medication Sig Dispense Refill  . buPROPion (WELLBUTRIN XL) 300 MG 24 hr tablet TAKE 1 TABLET BY MOUTH EVERY DAY 90 tablet 1  . calcium-vitamin D (OSCAL WITH D) 250-125 MG-UNIT tablet Take 1 tablet by mouth daily.    . Cholecalciferol (VITAMIN D3) 25 MCG (1000 UT) CAPS Take 2,000 Units by mouth.    . levothyroxine (SYNTHROID) 150 MCG tablet TAKE 1 TABLET (150 MCG TOTAL) BY MOUTH DAILY BEFORE BREAKFAST. SCHEDULE OFFICE VISIT 90 tablet 0  . ibuprofen (ADVIL,MOTRIN) 200 MG tablet Take 600 mg by mouth daily as needed for headache.    . lisdexamfetamine (VYVANSE) 40 MG capsule Take 1 capsule (40 mg total) by mouth daily. 30 capsule 0  . Multiple Vitamin (MULTIVITAMIN WITH MINERALS) TABS tablet Take 1 tablet by mouth daily. (Patient not taking: Reported on 12/22/2019)    . Omega-3 Fatty Acids (FISH OIL) 1000 MG CAPS Take 1,000 mg by mouth 2 (two) times daily.    . sertraline (ZOLOFT) 100 MG tablet Take 1 tablet (100 mg total) by mouth daily. 90 tablet 1   No current facility-administered medications for this visit.    Medication Side Effects: None  Allergies:  Allergies  Allergen Reactions  . Sulfa Antibiotics Hives    All over body    Past Medical History:  Diagnosis Date  . Absence of menstruation   . Allergic rhinitis, cause unspecified   . Anxiety state, unspecified   . Arthritis    DDD L4-5; ruptured disc; laminectomy.  . Cancer ([place]) 09/18/2010   thyroid cancer; s/p thyroidectomy total; s/p ablation.  [facility].  . Chicken pox   . Diabetes mellitus   . Dysplasia of cervix,  unspecified   . Dysthymic disorder   . Hematuria, unspecified   . Hypersomnia, unspecified    night shift work  . Insomnia, unspecified   . Mixed hyperlipidemia   . Other abnormal glucose   . Other acne   . Thyroid disease   . Tobacco use disorder   . Unspecified vitamin D deficiency     Family History  Problem Relation Age of Onset  . Depression Mother   . Kidney disease Mother        R Nephrectomy kidney stones  . Irritable bowel syndrome Mother   . Diabetes Father   . Depression Father   . Cancer Maternal Grandfather   . Bipolar disorder Son   . ADD / ADHD Son   . Hypertension Paternal Grandmother   . ADD / ADHD Brother   . ADD / ADHD Brother     Social History   Socioeconomic History  . Marital status: Married    Spouse name: Dawayne Cirri  . Number of children: 3  . Years of education: Not on file  . Highest education level: Not on file  Occupational History  . Occupation: Psych ED    Employer: [employer]  . Occupation: Programmer, multimedia: [employer]  Tobacco Use  . Smoking status: Former Smoker    Packs/day: 0.50    Years: 27.00    Pack years: 13.50    Types: Cigarettes  . Smokeless tobacco: Never Used  Substance and Sexual Activity  . Alcohol use: No    Alcohol/week: 0.0 standard drinks  . Drug use: No  . Sexual activity: Yes    Comment: same sexual partner  Other Topics Concern  . Not on file  Social History Narrative   Marital status:  Married same sex partner 12/2013 Dawayne Cirri. Happy, no abuse.      Children:  3 children (24, 22, 20); 3 grandchildren.        Lives: with wife.      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  Two sixteen hour shifts followed by 8 hour shift; weekends.  Off Mon-Thurs.  [place] job.  Graduate school in 2019.      Tobacco: 1 ppd x 30 years.        Alcohol: none; partner/wife in recovery.      Drugs: none      Exercise: none      Caffeine use: diet Pepsi and coffee.       Always uses seat belts.       Smoke alarm and carbon monoxide detector in the home.       No guns.    Social Determinants of Health   Financial Resource Strain: Not on file  Food Insecurity: Not on file  Transportation Needs: Not on file  Physical Activity: Not on file  Stress: Not on file  Social Connections: Not on file  Intimate Partner Violence: Not on file    Past Medical History, Surgical  history, Social history, and Family history were reviewed and updated as appropriate.   Please see review of systems for further details on the patient's review from today.   Objective:   Physical Exam:  BP 132/68   Pulse 78   Wt 193 lb (87.5 kg)   LMP 03/05/2013   BMI 29.35 kg/m   Physical Exam  Lab Review:     Component Value Date/Time   NA 141 12/18/2018 0937   K 4.7 12/18/2018 0937   CL 105 12/18/2018 0937   CO2 22 12/18/2018 0937   GLUCOSE 104 (H) 12/18/2018 0937   GLUCOSE 102 (H) 11/24/2015 1519   BUN 14 12/18/2018 0937   CREATININE 0.97 12/18/2018 0937   CREATININE 0.77 11/24/2015 1519   CALCIUM 9.7 12/18/2018 0937   PROT 7.2 12/18/2018 0937   ALBUMIN 4.5 12/18/2018 0937   AST 18 12/18/2018 0937   ALT 29 12/18/2018 0937   ALKPHOS 102 12/18/2018 0937   BILITOT 0.4 12/18/2018 0937   GFRNONAA 69 12/18/2018 0937   GFRNONAA >89 06/17/2013 0909   GFRAA 80 12/18/2018 0937   GFRAA >89 06/17/2013 0909       Component Value Date/Time   WBC 4.5 12/18/2018 0937   WBC 5.7 11/24/2015 1532   WBC 6.7 10/14/2014 0820   RBC 4.24 12/18/2018 0937   RBC 4.26 11/24/2015 1532   RBC 4.63 10/14/2014 0820   HGB 12.4 12/18/2018 0937   HCT 36.9 12/18/2018 0937   PLT 188 12/18/2018 0937   MCV 87 12/18/2018 0937   MCH 29.2 12/18/2018 0937   MCH 30.0 11/24/2015 1532   MCH 28.5 10/14/2014 0820   MCHC 33.6 12/18/2018 0937   MCHC 35.4 11/24/2015 1532   MCHC 34.1 10/14/2014 0820   RDW 12.9 12/18/2018 0937   LYMPHSABS 1.9 12/18/2018 0937   MONOABS 0.4 10/14/2014 0820    EOSABS 0.1 12/18/2018 0937   BASOSABS 0.0 12/18/2018 0937    No results found for: POCLITH, LITHIUM   No results found for: PHENYTOIN, PHENOBARB, VALPROATE, CBMZ   .res Assessment: Plan:   Pt reports that she would prefer to decrease Vyvanse to 40 mg daily. Advised pt to notify office if this dose is effective or if she would prefer to resume Vyvanse 50 mg daily.  Continue Sertraline 100 mg po qd for depression and anxiety.  Continue Wellbutrin XL 300 mg po qd for depression.  Pt to follow-up in 3 months or sooner if clinically indicated.  Patient advised to contact office with any questions, adverse effects, or acute worsening in signs and symptoms.  Taylor Powers was seen today for follow-up.  Diagnoses and all orders for this visit:  Attention deficit hyperactivity disorder (ADHD), predominantly inattentive type -     lisdexamfetamine (VYVANSE) 40 MG capsule; Take 1 capsule (40 mg total) by mouth daily.  Recurrent major depressive disorder, in full remission [facility])     Please see After Visit Summary for patient specific instructions.  No future appointments.  No orders of the defined types were placed in this encounter.     -------------------------------

## 2020-05-20 MED ORDER — LISDEXAMFETAMINE DIMESYLATE 40 MG PO CAPS: 40.0000 mg | ORAL_CAPSULE | Freq: Every day | ORAL | 0 refills | Status: DC

## 2020-05-20 MED ORDER — LISDEXAMFETAMINE DIMESYLATE 40 MG PO CAPS
40.0000 mg | ORAL_CAPSULE | ORAL | 0 refills | Status: DC
Start: 2020-06-23 — End: 2021-01-03

## 2020-05-20 MED ORDER — LISDEXAMFETAMINE DIMESYLATE 40 MG PO CAPS: 40.0000 mg | ORAL_CAPSULE | ORAL | 0 refills | Status: DC

## 2020-06-13 ENCOUNTER — Other Ambulatory Visit: Payer: Self-pay | Admitting: Psychiatry

## 2020-06-13 DIAGNOSIS — F3342 Major depressive disorder, recurrent, in full remission: Secondary | ICD-10-CM

## 2020-07-08 ENCOUNTER — Other Ambulatory Visit: Payer: Self-pay | Admitting: Emergency Medicine

## 2020-07-08 DIAGNOSIS — E890 Postprocedural hypothyroidism: Secondary | ICD-10-CM

## 2020-09-17 ENCOUNTER — Other Ambulatory Visit: Payer: Self-pay | Admitting: Psychiatry

## 2020-09-17 DIAGNOSIS — F3342 Major depressive disorder, recurrent, in full remission: Secondary | ICD-10-CM

## 2021-01-03 ENCOUNTER — Encounter: Payer: Self-pay | Admitting: Psychiatry

## 2021-01-03 ENCOUNTER — Telehealth (INDEPENDENT_AMBULATORY_CARE_PROVIDER_SITE_OTHER): Payer: BC Managed Care – PPO | Admitting: Psychiatry

## 2021-01-03 DIAGNOSIS — F3342 Major depressive disorder, recurrent, in full remission: Secondary | ICD-10-CM | POA: Diagnosis not present

## 2021-01-03 DIAGNOSIS — F900 Attention-deficit hyperactivity disorder, predominantly inattentive type: Secondary | ICD-10-CM

## 2021-01-03 MED ORDER — SERTRALINE HCL 100 MG PO TABS: 100.0000 mg | ORAL_TABLET | Freq: Every day | ORAL | 1 refills | Status: DC

## 2021-01-03 MED ORDER — LISDEXAMFETAMINE DIMESYLATE 40 MG PO CAPS: 40.0000 mg | ORAL_CAPSULE | ORAL | 0 refills | Status: DC

## 2021-01-03 MED ORDER — BUPROPION HCL ER (XL) 300 MG PO TB24: 300.0000 mg | ORAL_TABLET | Freq: Every day | ORAL | 1 refills | Status: DC

## 2021-01-03 MED ORDER — LISDEXAMFETAMINE DIMESYLATE 40 MG PO CAPS
40.0000 mg | ORAL_CAPSULE | Freq: Every day | ORAL | 0 refills | Status: DC
Start: 2021-02-28 — End: 2021-03-02

## 2021-01-03 NOTE — Progress Notes (Signed)
Taylor Powers [id number] 11/23/1970 50 y.o.  Virtual Visit via Video Note  I connected with pt @ on 01/03/21 at 10:30 AM EDT by a video enabled telemedicine application and verified that I am speaking with the correct person using two identifiers.   I discussed the limitations of evaluation and management by telemedicine and the availability of in person appointments. The patient expressed understanding and agreed to proceed.  I discussed the assessment and treatment plan with the patient. The patient was provided an opportunity to ask questions and all were answered. The patient agreed with the plan and demonstrated an understanding of the instructions.   The patient was advised to call back or seek an in-person evaluation if the symptoms worsen or if the condition fails to improve as anticipated.  I provided 25 minutes of non-face-to-face time during this encounter.  The patient was located at home.  The provider was located at [facility].   Thayer Headings, PMHNP   Subjective:   Patient ID:  Taylor Powers is a 50 y.o. (DOB Jul 10, 1970) female.  Chief Complaint:  Chief Complaint  Patient presents with   Follow-up    ADD, h/o anxiety and depression     HPI Taylor Powers presents for follow-up of ADD, anxiety, and depression. Her daughter was dx'd with Stage IV breast cancer. Daughter relocated from [place] for treatments.   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   She reports that she has been out of Vyvanse for 6 weeks and can tell she is not as focused and having difficulty concentrating.   Reports that she gained 20 lbs over the summer and she went off eating plan and is now back on eating plan. She reports that she has been able to cry to help release stress with daughter;s illness. She reports that her anxiety has been "manageable, very appropriate for the situation." She denies irritability. Sleeping well most  of the time. Energy and motivation have been "good enough." Denies SI.   She will be seeing a new therapist soon.   Past Psychiatric Medication Trials: Lexapro- Took after her divorce Wellbutrin XL Vyvanse- Effective Sertraline- 100 mg is effective. Noticed some affective dulling at 150 mg   Review of Systems:  Review of Systems  Cardiovascular:  Negative for palpitations.  Gastrointestinal: Negative.   Musculoskeletal:  Negative for gait problem.  Neurological:  Positive for headaches.  Psychiatric/Behavioral:         Please refer to HPI   Medications: I have reviewed the patient's current medications.  Current Outpatient Medications  Medication Sig Dispense Refill   calcium-vitamin D (OSCAL WITH D) 250-125 MG-UNIT tablet Take 1 tablet by mouth daily.     Cholecalciferol (VITAMIN D3) 25 MCG (1000 UT) CAPS Take 2,000 Units by mouth.     Multiple Vitamin (MULTIVITAMIN WITH MINERALS) TABS tablet Take 1 tablet by mouth daily.     Omega-3 Fatty Acids (FISH OIL) 1000 MG CAPS Take 1,000 mg by mouth 2 (two) times daily.     buPROPion (WELLBUTRIN XL) 300 MG 24 hr tablet Take 1 tablet (300 mg total) by mouth daily. 90 tablet 1   ibuprofen (ADVIL,MOTRIN) 200 MG tablet Take 600 mg by mouth daily as needed for headache.     levothyroxine (SYNTHROID) 150 MCG tablet TAKE 1 TABLET (150 MCG TOTAL) BY MOUTH DAILY BEFORE BREAKFAST. SCHEDULE OFFICE VISIT 90 tablet 0   [START ON 02/28/2021] lisdexamfetamine (VYVANSE) 40 MG capsule  Take 1 capsule (40 mg total) by mouth daily. 30 capsule 0   [START ON 01/31/2021] lisdexamfetamine (VYVANSE) 40 MG capsule Take 1 capsule (40 mg total) by mouth every morning. 30 capsule 0   lisdexamfetamine (VYVANSE) 40 MG capsule Take 1 capsule (40 mg total) by mouth every morning. 30 capsule 0   sertraline (ZOLOFT) 100 MG tablet Take 1 tablet (100 mg total) by mouth daily. 90 tablet 1   No current facility-administered medications for this visit.    Medication Side  Effects: None  Allergies:  Allergies  Allergen Reactions   Sulfa Antibiotics Hives    All over body    Past Medical History:  Diagnosis Date   Absence of menstruation    Allergic rhinitis, cause unspecified    Anxiety state, unspecified    Arthritis    DDD L4-5; ruptured disc; laminectomy.   Cancer ([place]) 09/18/2010   thyroid cancer; s/p thyroidectomy total; s/p ablation.  [facility].   Chicken pox    Diabetes mellitus    Dysplasia of cervix, unspecified    Dysthymic disorder    Hematuria, unspecified    Hypersomnia, unspecified    night shift work   Insomnia, unspecified    Mixed hyperlipidemia    Other abnormal glucose    Other acne    Thyroid disease    Tobacco use disorder    Unspecified vitamin D deficiency     Family History  Problem Relation Age of Onset   Depression Mother    Kidney disease Mother        R Nephrectomy kidney stones   Irritable bowel syndrome Mother    Diabetes Father    Depression Father    Cancer Maternal Grandfather    Bipolar disorder Son    ADD / ADHD Son    Hypertension Paternal Grandmother    ADD / ADHD Brother    ADD / ADHD Brother     Social History   Socioeconomic History   Marital status: Married    Spouse name: Dawayne Cirri   Number of children: 3   Years of education: Not on file   Highest education level: Not on file  Occupational History   Occupation: Psych ED    Employer: [employer]   Occupation: Programmer, multimedia: [employer]  Tobacco Use   Smoking status: Former    Packs/day: 0.50    Years: 27.00    Pack years: 13.50    Types: Cigarettes   Smokeless tobacco: Never  Substance and Sexual Activity   Alcohol use: No    Alcohol/week: 0.0 standard drinks   Drug use: No   Sexual activity: Yes    Comment: same sexual partner  Other Topics Concern   Not on file  Social History Narrative   Marital status:  Married same sex partner 12/2013 Dawayne Cirri. Happy, no abuse.      Children:  3 children (24, 22, 20); 3  grandchildren.        Lives: with wife.      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  Two sixteen hour shifts followed by 8 hour shift; weekends.  Off Mon-Thurs.  [employer] job.  Graduate school in 2019.      Tobacco: 1 ppd x 30 years.        Alcohol: none; partner/wife in recovery.      Drugs: none      Exercise: none      Caffeine use: diet  Pepsi and coffee.      Always uses seat belts.       Smoke alarm and carbon monoxide detector in the home.       No guns.    Social Determinants of Health   Financial Resource Strain: Not on file  Food Insecurity: Not on file  Transportation Needs: Not on file  Physical Activity: Not on file  Stress: Not on file  Social Connections: Not on file  Intimate Partner Violence: Not on file    Past Medical History, Surgical history, Social history, and Family history were reviewed and updated as appropriate.   Please see review of systems for further details on the patient's review from today.   Objective:   Physical Exam:  BP 132/74   Pulse 90   Wt 205 lb (93 kg)   LMP 03/05/2013   BMI 31.17 kg/m   Physical Exam Neurological:     Mental Status: She is alert and oriented to person, place, and time.     Cranial Nerves: No dysarthria.  Psychiatric:        Attention and Perception: Attention and perception normal.        Mood and Affect: Mood normal.        Speech: Speech normal.        Behavior: Behavior is cooperative.        Thought Content: Thought content normal. Thought content is not paranoid or delusional. Thought content does not include homicidal or suicidal ideation. Thought content does not include homicidal or suicidal plan.        Cognition and Memory: Cognition and memory normal.        Judgment: Judgment normal.     Comments: Insight intact    Lab Review:     Component Value Date/Time   NA 141 12/18/2018 0937   K 4.7 12/18/2018 0937   CL 105 12/18/2018 0937   CO2 22  12/18/2018 0937   GLUCOSE 104 (H) 12/18/2018 0937   GLUCOSE 102 (H) 11/24/2015 1519   BUN 14 12/18/2018 0937   CREATININE 0.97 12/18/2018 0937   CREATININE 0.77 11/24/2015 1519   CALCIUM 9.7 12/18/2018 0937   PROT 7.2 12/18/2018 0937   ALBUMIN 4.5 12/18/2018 0937   AST 18 12/18/2018 0937   ALT 29 12/18/2018 0937   ALKPHOS 102 12/18/2018 0937   BILITOT 0.4 12/18/2018 0937   GFRNONAA 69 12/18/2018 0937   GFRNONAA >89 06/17/2013 0909   GFRAA 80 12/18/2018 0937   GFRAA >89 06/17/2013 0909       Component Value Date/Time   WBC 4.5 12/18/2018 0937   WBC 5.7 11/24/2015 1532   WBC 6.7 10/14/2014 0820   RBC 4.24 12/18/2018 0937   RBC 4.26 11/24/2015 1532   RBC 4.63 10/14/2014 0820   HGB 12.4 12/18/2018 0937   HCT 36.9 12/18/2018 0937   PLT 188 12/18/2018 0937   MCV 87 12/18/2018 0937   MCH 29.2 12/18/2018 0937   MCH 30.0 11/24/2015 1532   MCH 28.5 10/14/2014 0820   MCHC 33.6 12/18/2018 0937   MCHC 35.4 11/24/2015 1532   MCHC 34.1 10/14/2014 0820   RDW 12.9 12/18/2018 0937   LYMPHSABS 1.9 12/18/2018 0937   MONOABS 0.4 10/14/2014 0820   EOSABS 0.1 12/18/2018 0937   BASOSABS 0.0 12/18/2018 0937    No results found for: POCLITH, LITHIUM   No results found for: PHENYTOIN, PHENOBARB, VALPROATE, CBMZ   .res Assessment: Plan:    Will continue current plan of care  since target signs and symptoms are well controlled without any tolerability issues. Will continue Vyvanse 40 mg po qd for ADHD.  Continue Wellbutrin XL 300 mg po qd for depression.  Continue Sertraline 100 mg po qd for anxiety and depression.  Pt to follow-up in 6 months or sooner if clinically indicated.  Requested pt call in 3 months to provide update and request additional scripts.  Patient advised to contact office with any questions, adverse effects, or acute worsening in signs and symptoms.   Taylor Powers was seen today for follow-up.  Diagnoses and all orders for this visit:  Attention deficit hyperactivity  disorder (ADHD), predominantly inattentive type -     lisdexamfetamine (VYVANSE) 40 MG capsule; Take 1 capsule (40 mg total) by mouth daily. -     lisdexamfetamine (VYVANSE) 40 MG capsule; Take 1 capsule (40 mg total) by mouth every morning. -     lisdexamfetamine (VYVANSE) 40 MG capsule; Take 1 capsule (40 mg total) by mouth every morning.  Recurrent major depressive disorder, in full remission ([place]) -     buPROPion (WELLBUTRIN XL) 300 MG 24 hr tablet; Take 1 tablet (300 mg total) by mouth daily. -     sertraline (ZOLOFT) 100 MG tablet; Take 1 tablet (100 mg total) by mouth daily.    Please see After Visit Summary for patient specific instructions.  Future Appointments  Date Time Provider [place]  07/04/2021 10:00 AM Thayer Headings, PMHNP CP-CP None     No orders of the defined types were placed in this encounter.     -------------------------------

## 2021-03-02 ENCOUNTER — Other Ambulatory Visit: Payer: Self-pay

## 2021-03-02 ENCOUNTER — Telehealth: Payer: Self-pay | Admitting: Psychiatry

## 2021-03-02 DIAGNOSIS — F900 Attention-deficit hyperactivity disorder, predominantly inattentive type: Secondary | ICD-10-CM

## 2021-03-02 MED ORDER — LISDEXAMFETAMINE DIMESYLATE 40 MG PO CAPS: 40.0000 mg | ORAL_CAPSULE | Freq: Every day | ORAL | 0 refills | Status: DC

## 2021-03-02 NOTE — Telephone Encounter (Signed)
Mekesha called because her new [facility]'t [facility].  [facility] and [facility] to [facility] on [address]

## 2021-03-02 NOTE — Telephone Encounter (Signed)
Pended.

## 2021-04-15 ENCOUNTER — Other Ambulatory Visit: Payer: Self-pay

## 2021-04-15 ENCOUNTER — Telehealth: Payer: Self-pay | Admitting: Psychiatry

## 2021-04-15 DIAGNOSIS — F900 Attention-deficit hyperactivity disorder, predominantly inattentive type: Secondary | ICD-10-CM

## 2021-04-15 MED ORDER — LISDEXAMFETAMINE DIMESYLATE 40 MG PO CAPS: 40.0000 mg | ORAL_CAPSULE | ORAL | 0 refills | Status: DC

## 2021-04-15 MED ORDER — LISDEXAMFETAMINE DIMESYLATE 40 MG PO CAPS: 40.0000 mg | ORAL_CAPSULE | Freq: Every day | ORAL | 0 refills | Status: DC

## 2021-04-15 NOTE — Telephone Encounter (Signed)
Pended.

## 2021-04-15 NOTE — Telephone Encounter (Signed)
Pt requested refill of Vyvanse to   [facility] [address]  [place], [place] [address]  Phone:  [phone]  Fax:  [phone]   Also, pls update her "regular" pharmacy to this location for all future meds.  [facility].  Next appt 4/17

## 2021-07-04 ENCOUNTER — Encounter: Payer: Self-pay | Admitting: Psychiatry

## 2021-07-04 ENCOUNTER — Telehealth (INDEPENDENT_AMBULATORY_CARE_PROVIDER_SITE_OTHER): Payer: Self-pay | Admitting: Psychiatry

## 2021-07-04 DIAGNOSIS — F3342 Major depressive disorder, recurrent, in full remission: Secondary | ICD-10-CM

## 2021-07-04 DIAGNOSIS — F900 Attention-deficit hyperactivity disorder, predominantly inattentive type: Secondary | ICD-10-CM

## 2021-07-04 MED ORDER — LISDEXAMFETAMINE DIMESYLATE 40 MG PO CAPS: 40.0000 mg | ORAL_CAPSULE | ORAL | 0 refills | Status: DC

## 2021-07-04 MED ORDER — LISDEXAMFETAMINE DIMESYLATE 40 MG PO CAPS: 40.0000 mg | ORAL_CAPSULE | Freq: Every day | ORAL | 0 refills | Status: DC

## 2021-07-04 MED ORDER — SERTRALINE HCL 100 MG PO TABS: 100.0000 mg | ORAL_TABLET | Freq: Every day | ORAL | 1 refills | Status: DC

## 2021-07-04 MED ORDER — BUPROPION HCL ER (XL) 300 MG PO TB24: 300.0000 mg | ORAL_TABLET | Freq: Every day | ORAL | 1 refills | Status: DC

## 2021-07-04 NOTE — Progress Notes (Signed)
[name] ?[id number] ?[birth year] ?51 y.o. ? ?Virtual Visit via Video Note ? ?I connected with pt @ on 07/04/21 at 10:00 AM EDT by a video enabled telemedicine application and verified that I am speaking with the correct person using two identifiers. ?  ?I discussed the limitations of evaluation and management by telemedicine and the availability of in person appointments. The patient expressed understanding and agreed to proceed. ? ?I discussed the assessment and treatment plan with the patient. The patient was provided an opportunity to ask questions and all were answered. The patient agreed with the plan and demonstrated an understanding of the instructions. ?  ?The patient was advised to call back or seek an in-person evaluation if the symptoms worsen or if the condition fails to improve as anticipated. ? ?I provided 20 minutes of non-face-to-face time during this encounter.  The patient was located at home.  The provider was located at [place]. ? ? ?Thayer Headings, PMHNP  ? ?Subjective:  ? ?Patient ID:  Taylor Powers is a 51 y.o. (DOB November 26, 1970) female. ? ?Chief Complaint:  ?Chief Complaint  ?Patient presents with  ? Follow-up  ?  Anxiety, depression, and ADD  ? ? ?HPI ?Taylor Powers presents for follow-up of ADD, anxiety, and depression. She reports that her mood has been good.  ?She reports that she does not take Vyvanse every single day and when she does not take it she feels less focused and eats more. She reports that Vyvanse is effective for her concentration and focus. She reports that she has gained weight and attributes this to stress. Denies significant anxiety. Denies depressed mood. Energy and motivation are ok as long as she is "on track." Sleeping well. Denies SI.  ? ?She reports that she is working often. She continues to enjoy her new job. She goes into the office a couple of days a week and remotely some of the time.  ? ?Daughter is now on maintenance treatment for cancer.  ? ?Past  Psychiatric Medication Trials: ?Lexapro- Took after her divorce ?Wellbutrin XL ?Vyvanse- Effective ?Sertraline- 100 mg is effective. Noticed some affective dulling at 150 mg  ? ?Review of Systems:  ?Review of Systems  ?Cardiovascular:  Negative for palpitations.  ?Musculoskeletal:  Negative for gait problem.  ?Neurological:  Negative for tremors.  ?Psychiatric/Behavioral:    ?     Please refer to HPI  ? ?Medications: I have reviewed the patient's current medications. ? ?Current Outpatient Medications  ?Medication Sig Dispense Refill  ? Cholecalciferol (VITAMIN D3) 25 MCG (1000 UT) CAPS Take 2,000 Units by mouth.    ? ibuprofen (ADVIL,MOTRIN) 200 MG tablet Take 600 mg by mouth daily as needed for headache.    ? Multiple Vitamin (MULTIVITAMIN WITH MINERALS) TABS tablet Take 1 tablet by mouth daily.    ? buPROPion (WELLBUTRIN XL) 300 MG 24 hr tablet Take 1 tablet (300 mg total) by mouth daily. 90 tablet 1  ? calcium-vitamin D (OSCAL WITH D) 250-125 MG-UNIT tablet Take 1 tablet by mouth daily. (Patient not taking: Reported on 07/04/2021)    ? levothyroxine (SYNTHROID) 150 MCG tablet TAKE 1 TABLET (150 MCG TOTAL) BY MOUTH DAILY BEFORE BREAKFAST. SCHEDULE OFFICE VISIT 90 tablet 0  ? [START ON 09/13/2021] lisdexamfetamine (VYVANSE) 40 MG capsule Take 1 capsule (40 mg total) by mouth every morning. 30 capsule 0  ? [START ON 08/16/2021] lisdexamfetamine (VYVANSE) 40 MG capsule Take 1 capsule (40 mg total) by mouth every morning. 30 capsule 0  ? [START ON  07/19/2021] lisdexamfetamine (VYVANSE) 40 MG capsule Take 1 capsule (40 mg total) by mouth daily. 30 capsule 0  ? Omega-3 Fatty Acids (FISH OIL) 1000 MG CAPS Take 1,000 mg by mouth 2 (two) times daily. (Patient not taking: Reported on 07/04/2021)    ? sertraline (ZOLOFT) 100 MG tablet Take 1 tablet (100 mg total) by mouth daily. 90 tablet 1  ? ?No current facility-administered medications for this visit.  ? ? ?Medication Side Effects: None ? ?Allergies:  ?Allergies  ?Allergen  Reactions  ? Sulfa Antibiotics Hives  ?  All over body  ? ? ?Past Medical History:  ?Diagnosis Date  ? Absence of menstruation   ? Allergic rhinitis, cause unspecified   ? Anxiety state, unspecified   ? Arthritis   ? DDD L4-5; ruptured disc; laminectomy.  ? Cancer ([place]) 09/18/2010  ? thyroid cancer; s/p thyroidectomy total; s/p ablation.  [facility].  ? Chicken pox   ? Diabetes mellitus   ? Dysplasia of cervix, unspecified   ? Dysthymic disorder   ? Hematuria, unspecified   ? Hypersomnia, unspecified   ? night shift work  ? Insomnia, unspecified   ? Mixed hyperlipidemia   ? Other abnormal glucose   ? Other acne   ? Thyroid disease   ? Tobacco use disorder   ? Unspecified vitamin D deficiency   ? ? ?Family History  ?Problem Relation Age of Onset  ? Depression Mother   ? Kidney disease Mother   ?     R Nephrectomy kidney stones  ? Irritable bowel syndrome Mother   ? Diabetes Father   ? Depression Father   ? Cancer Maternal Grandfather   ? Bipolar disorder Son   ? ADD / ADHD Son   ? Hypertension Paternal Grandmother   ? ADD / ADHD Brother   ? ADD / ADHD Brother   ? ? ?Social History  ? ?Socioeconomic History  ? Marital status: Married  ?  Spouse name: Dawayne Cirri  ? Number of children: 3  ? Years of education: Not on file  ? Highest education level: Not on file  ?Occupational History  ? Occupation: Psych ED  ?  Employer: [employer]  ? Occupation: Therapist, sports  ?  Employer: [employer]  ?Tobacco Use  ? Smoking status: Former  ?  Packs/day: 0.50  ?  Years: 27.00  ?  Pack years: 13.50  ?  Types: Cigarettes  ? Smokeless tobacco: Never  ?Substance and Sexual Activity  ? Alcohol use: No  ?  Alcohol/week: 0.0 standard drinks  ? Drug use: No  ? Sexual activity: Yes  ?  Comment: same sexual partner  ?Other Topics Concern  ? Not on file  ?Social History Narrative  ? Marital status:  Married same sex partner 12/2013 Dawayne Cirri. Happy, no abuse.  ?    Children:  3 children (24, 22, 20); 3 grandchildren.    ?    Lives: with wife.  ?     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  Two sixteen hour shifts followed by 8 hour shift; weekends.  Off Mon-Thurs.  [employer] job.  Graduate school in 2019.  ?    Tobacco: 1 ppd x 30 years.    ?    Alcohol: none; partner/wife in recovery.  ?    Drugs: none  ?    Exercise: none  ?    Caffeine use: diet Pepsi and coffee.  ?    Always uses  seat belts.   ?    Smoke alarm and carbon monoxide detector in the home.   ?    No guns.   ? ?Social Determinants of Health  ? ?Financial Resource Strain: Not on file  ?Food Insecurity: Not on file  ?Transportation Needs: Not on file  ?Physical Activity: Not on file  ?Stress: Not on file  ?Social Connections: Not on file  ?Intimate Partner Violence: Not on file  ? ? ?Past Medical History, Surgical history, Social history, and Family history were reviewed and updated as appropriate.  ? ?Please see review of systems for further details on the patient's review from today.  ? ?Objective:  ? ?Physical Exam:  ?BP 134/76   Pulse 78   LMP 03/05/2013  ? ?Physical Exam ?Neurological:  ?   Mental Status: She is alert and oriented to person, place, and time.  ?   Cranial Nerves: No dysarthria.  ?Psychiatric:     ?   Attention and Perception: Attention and perception normal.     ?   Mood and Affect: Mood normal.     ?   Speech: Speech normal.     ?   Behavior: Behavior is cooperative.     ?   Thought Content: Thought content normal. Thought content is not paranoid or delusional. Thought content does not include homicidal or suicidal ideation. Thought content does not include homicidal or suicidal plan.     ?   Cognition and Memory: Cognition and memory normal.     ?   Judgment: Judgment normal.  ?   Comments: Insight intact  ? ? ?Lab Review:  ?   ?Component Value Date/Time  ? NA 141 12/18/2018 0937  ? K 4.7 12/18/2018 0937  ? CL 105 12/18/2018 0937  ? CO2 22 12/18/2018 0937  ? GLUCOSE 104 (H) 12/18/2018 0981  ? GLUCOSE 102 (H) 11/24/2015 1519  ?  BUN 14 12/18/2018 0937  ? CREATININE 0.97 12/18/2018 0937  ? CREATININE 0.77 11/24/2015 1519  ? CALCIUM 9.7 12/18/2018 0937  ? PROT 7.2 12/18/2018 0937  ? ALBUMIN 4.5 12/18/2018 0937  ? AST 18 12/18/2018 0937  ?

## 2021-09-21 ENCOUNTER — Telehealth: Payer: Self-pay | Admitting: Emergency Medicine

## 2021-09-21 NOTE — Telephone Encounter (Signed)
I am okay with this transfer of care.  Thanks.

## 2021-09-21 NOTE — Telephone Encounter (Signed)
Patient made a NP appointment with Alyssa Allwardt but is a current patient of Dr. Mitchel Honour, which would make this a TOC. Patient states she did not feel she was connecting with her current PCP. Is this transfer okay with you all?

## 2021-09-26 MED ORDER — LISDEXAMFETAMINE DIMESYLATE 40 MG PO CAPS: 40.0000 mg | ORAL_CAPSULE | Freq: Every day | ORAL | 0 refills | Status: DC

## 2021-09-26 MED ORDER — LISDEXAMFETAMINE DIMESYLATE 40 MG PO CAPS: 40.0000 mg | ORAL_CAPSULE | ORAL | 0 refills | Status: DC

## 2021-10-05 ENCOUNTER — Ambulatory Visit: Payer: Commercial Managed Care - PPO | Admitting: Physician Assistant

## 2021-10-05 ENCOUNTER — Encounter: Payer: Self-pay | Admitting: Physician Assistant

## 2021-10-05 VITALS — BP 131/85 | HR 87 | Temp 97.9°F | Ht 66.93 in | Wt 234.2 lb

## 2021-10-05 DIAGNOSIS — Z131 Encounter for screening for diabetes mellitus: Secondary | ICD-10-CM

## 2021-10-05 DIAGNOSIS — R3129 Other microscopic hematuria: Secondary | ICD-10-CM

## 2021-10-05 DIAGNOSIS — E7800 Pure hypercholesterolemia, unspecified: Secondary | ICD-10-CM | POA: Diagnosis not present

## 2021-10-05 DIAGNOSIS — Z1211 Encounter for screening for malignant neoplasm of colon: Secondary | ICD-10-CM

## 2021-10-05 DIAGNOSIS — E890 Postprocedural hypothyroidism: Secondary | ICD-10-CM

## 2021-10-05 DIAGNOSIS — E782 Mixed hyperlipidemia: Secondary | ICD-10-CM

## 2021-10-05 DIAGNOSIS — Z23 Encounter for immunization: Secondary | ICD-10-CM

## 2021-10-05 LAB — COMPREHENSIVE METABOLIC PANEL
ALT: 23 U/L (ref 0–35)
AST: 17 U/L (ref 0–37)
Albumin: 4.4 g/dL (ref 3.5–5.2)
Alkaline Phosphatase: 74 U/L (ref 39–117)
BUN: 16 mg/dL (ref 6–23)
CO2: 27 mEq/L (ref 19–32)
Calcium: 9.1 mg/dL (ref 8.4–10.5)
Chloride: 103 mEq/L (ref 96–112)
Creatinine, Ser: 0.91 mg/dL (ref 0.40–1.20)
GFR: 73.09 mL/min (ref >60.00)
Glucose, Bld: 99 mg/dL (ref 70–99)
Potassium: 4.4 mEq/L (ref 3.5–5.1)
Sodium: 137 mEq/L (ref 135–145)
Total Bilirubin: 0.4 mg/dL (ref 0.2–1.2)
Total Protein: 7.1 g/dL (ref 6.0–8.3)

## 2021-10-05 LAB — LIPID PANEL
Cholesterol: 209 mg/dL — ABNORMAL HIGH (ref 0–200)
HDL: 31.4 mg/dL — ABNORMAL LOW (ref >39.00)
LDL Cholesterol: 143 mg/dL — ABNORMAL HIGH (ref 0–99)
NonHDL: 177.79
Total CHOL/HDL Ratio: 7
Triglycerides: 175 mg/dL — ABNORMAL HIGH (ref 0.0–149.0)
VLDL: 35 mg/dL (ref 0.0–40.0)

## 2021-10-05 LAB — CBC WITH DIFFERENTIAL/PLATELET
Basophils Absolute: 0 10*3/uL (ref 0.0–0.1)
Basophils Relative: 0.6 % (ref 0.0–3.0)
Eosinophils Absolute: 0.1 10*3/uL (ref 0.0–0.7)
Eosinophils Relative: 2.8 % (ref 0.0–5.0)
HCT: 36.4 % (ref 36.0–46.0)
Hemoglobin: 12.5 g/dL (ref 12.0–15.0)
Lymphocytes Relative: 37 % (ref 12.0–46.0)
Lymphs Abs: 1.7 10*3/uL (ref 0.7–4.0)
MCHC: 34.2 g/dL (ref 30.0–36.0)
MCV: 86.7 fl (ref 78.0–100.0)
Monocytes Absolute: 0.3 10*3/uL (ref 0.1–1.0)
Monocytes Relative: 7.1 % (ref 3.0–12.0)
Neutro Abs: 2.5 10*3/uL (ref 1.4–7.7)
Neutrophils Relative %: 52.5 % (ref 43.0–77.0)
Platelets: 180 10*3/uL (ref 150.0–400.0)
RBC: 4.2 Mil/uL (ref 3.87–5.11)
RDW: 13.6 % (ref 11.5–15.5)
WBC: 4.7 10*3/uL (ref 4.0–10.5)

## 2021-10-05 LAB — HEMOGLOBIN A1C: Hgb A1c MFr Bld: 5.8 % (ref 4.6–6.5)

## 2021-10-05 LAB — POC URINALSYSI DIPSTICK (AUTOMATED)
Bilirubin, UA: NEGATIVE
Blood, UA: POSITIVE
Glucose, UA: NEGATIVE
Ketones, UA: NEGATIVE
Leukocytes, UA: NEGATIVE
Nitrite, UA: NEGATIVE
Protein, UA: NEGATIVE
Spec Grav, UA: 1.015 (ref 1.010–1.025)
Urobilinogen, UA: 0.2 E.U./dL
pH, UA: 7 (ref 5.0–8.0)

## 2021-10-05 LAB — TSH: TSH: 1.42 u[IU]/mL (ref 0.35–5.50)

## 2021-10-05 NOTE — Patient Instructions (Signed)
Welcome to [facility]! It was a pleasure meeting you today.  Labs this morning. First shingrix vaccine. Tetanus updated.  Praying for you and your family. Reach out if you need anything.   As discussed, Please schedule a 3-6 month follow up visit today.  PLEASE NOTE:  If you had any LAB tests please let us know if you have not heard back within a few days. You may see your results on MyChart before we have a chance to review them but we will give you a call once they are reviewed by [place]. If we ordered any REFERRALS today, please let us know if you have not heard from their office within the next two weeks. Let us know through MyChart if you are needing REFILLS, or have your pharmacy send [place] the request. You can also use MyChart to communicate with me or any office staff.  Please try these tips to maintain a healthy lifestyle:  Eat most of your calories during the day when you are active. Eliminate processed foods including packaged sweets (pies, cakes, cookies), reduce intake of potatoes, white bread, white pasta, and white rice. Look for whole grain options, oat flour or almond flour.  Each meal should contain half fruits/vegetables, one quarter protein, and one quarter carbs (no bigger than a computer mouse).  Cut down on sweet beverages. This includes juice, soda, and sweet tea. Also watch fruit intake, though this is a healthier sweet option, it still contains natural sugar! Limit to 3 servings daily.  Drink at least 1 glass of water with each meal and aim for at least 8 glasses (64 ounces) per day.  Exercise at least 150 minutes every week to the best of your ability.    Take Care,  Romie Tay, PA-C

## 2021-10-05 NOTE — Progress Notes (Signed)
Subjective:    Patient ID: Taylor Powers, female    DOB: March 20, 1971, 51 y.o.   MRN: [id number]  Chief Complaint  Patient presents with   [facility] pt here to establish with PCP; pt overdue for CPE; last seen 2020; pt is not fasting; pt will schedule Pap with OBGYN; referral needs to be placed for Colonoscopy;     HPI 51 y.o. patient presents today for new patient establishment with me.  Patient was previously established with Dr. Mitchel Honour. 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  Current Care Team: Psych NP at [facility]    Concerns: Needing labs updated Needing colon cancer screening, vaccinations updated Needs recheck of urine.  States that she has had microscopic blood in her urine in the past and was supposed to follow-up with urology, but did not follow through on this.  Denies any urinary symptoms. Daughter going through stage 4 breast cancer    Past Medical History:  Diagnosis Date   Absence of menstruation    Allergic rhinitis, cause unspecified    Anxiety state, unspecified    Arthritis    DDD L4-5; ruptured disc; laminectomy.   Cancer ([place]) 09/18/2010   thyroid cancer; s/p thyroidectomy total; s/p ablation.  [place].   Chicken pox    Diabetes mellitus    Dysplasia of cervix, unspecified    Dysthymic disorder    Hematuria, unspecified    Hypersomnia, unspecified    night shift work   Insomnia, unspecified    Mixed hyperlipidemia    Other abnormal glucose    Other acne    Thyroid disease    Tobacco use disorder    Unspecified vitamin D deficiency     Past Surgical History:  Procedure Laterality Date   CHOLECYSTECTOMY N/A 04/15/2018   Procedure: LAPAROSCOPIC CHOLECYSTECTOMY WITH INTRAOPERATIVE CHOLANGIOGRAM;  Surgeon: Alphonsa Overall, MD;  Location: [facility];  Service: General;  Laterality: N/A;   history of LEEP  2005   LAMINECTOMY  1996   L4-L5   lipoma of shoulder  2005   Right . removed   SPINE SURGERY  03/20/1994   L4-L5 laminectomy    TOTAL THYROIDECTOMY  2012   TUBAL LIGATION  1998    Family History  Problem Relation Age of Onset   Depression Mother    Kidney disease Mother        R Nephrectomy kidney stones   Irritable bowel syndrome Mother    Diabetes Father    Depression Father    Cancer Maternal Grandfather    Bipolar disorder Son    ADD / ADHD Son    Hypertension Paternal Grandmother    ADD / ADHD Brother    ADD / ADHD Brother     Social History   Tobacco Use   Smoking status: Former    Packs/day: 0.50    Years: 27.00    Total pack years: 13.50    Types: Cigarettes   Smokeless tobacco: Never  Substance Use Topics   Alcohol use: No    Alcohol/week: 0.0 standard drinks of alcohol   Drug use: No     Allergies  Allergen Reactions   Sulfa Antibiotics Hives    All over body    Review of Systems NEGATIVE UNLESS OTHERWISE INDICATED IN HPI      Objective:     BP 131/85 (BP Location: Left Arm)   Pulse 87   Temp 97.9 F (36.6 C) (Temporal)   Ht 5' 6.93" (1.7 m)  Wt 234 lb 3.2 oz (106.2 kg)   LMP 03/05/2013   SpO2 97%   BMI 36.76 kg/m   Wt Readings from Last 3 Encounters:  10/05/21 234 lb 3.2 oz (106.2 kg)  12/18/18 246 lb 12.8 oz (111.9 kg)  04/15/18 231 lb (104.8 kg)    BP Readings from Last 3 Encounters:  10/05/21 131/85  12/18/18 127/83  04/15/18 133/89     Physical Exam Vitals and nursing note reviewed.  Constitutional:      Appearance: Normal appearance. She is normal weight. She is not toxic-appearing.  HENT:     Head: Normocephalic and atraumatic.  Eyes:     Extraocular Movements: Extraocular movements intact.     Conjunctiva/sclera: Conjunctivae normal.     Pupils: Pupils are equal, round, and reactive to light.  Cardiovascular:     Rate and Rhythm: Normal rate and regular rhythm.     Pulses: Normal pulses.     Heart sounds: Normal heart sounds.  Pulmonary:     Effort: Pulmonary effort is normal.     Breath sounds: Normal breath sounds.  Musculoskeletal:         General: Normal range of motion.     Cervical back: Normal range of motion and neck supple.  Skin:    General: Skin is warm and dry.  Neurological:     General: No focal deficit present.     Mental Status: She is alert and oriented to person, place, and time.  Psychiatric:        Mood and Affect: Mood normal.        Behavior: Behavior normal.        Thought Content: Thought content normal.        Judgment: Judgment normal.        Assessment & Plan:   Problem List Items Addressed This Visit       Endocrine   Postsurgical hypothyroidism (Chronic)   Relevant Orders   CBC with Differential/Platelet (Completed)   TSH (Completed)     Other   Pure hypercholesterolemia   Relevant Orders   Lipid panel (Completed)   Other Visit Diagnoses     Mixed hyperlipidemia    -  Primary   Relevant Orders   CBC with Differential/Platelet (Completed)   Lipid panel (Completed)   Diabetes mellitus screening       Relevant Orders   CBC with Differential/Platelet (Completed)   Comprehensive metabolic panel (Completed)   Hemoglobin A1c (Completed)   Microscopic hematuria       Relevant Orders   CBC with Differential/Platelet (Completed)   POCT Urinalysis Dipstick (Automated) (Completed)   Need for prophylactic vaccination with combined diphtheria-tetanus-pertussis (DTP) vaccine       Relevant Orders   Tdap vaccine greater than or equal to 7yo IM (Completed)   Need for prophylactic vaccination and inoculation against varicella       Relevant Orders   Varicella-zoster vaccine IM (Shingrix) (Completed)   Screening for colon cancer       Relevant Orders   Cologuard       Plan: Pleasant new patient establishment today.  Recent stress in the family as her daughter is going through stage IV cancer treatments.  Plan to update patient's labs today and treat pending results.  Her first Shingrix vaccine was given and her tetanus was also updated today.  She is a candidate for Cologuard  and I placed an order for this today as well. History of microscopic hematuria per patient.  Pending this is still holding true, plan to refer to urology.  She knows to reach out sooner if she has any questions or concerns.   Return in about 3 months (around 01/05/2022) for recheck .  This note was prepared with assistance of Systems analyst. Occasional wrong-word or sound-a-like substitutions may have occurred due to the inherent limitations of voice recognition software.  Time Spent: 30 minutes of total time was spent on the date of the encounter performing the following actions: chart review prior to seeing the patient, obtaining history, performing a medically necessary exam, counseling on the treatment plan, placing orders, and documenting in our EHR.      Dejuan Elman M Brevin Mcfadden, PA-C

## 2021-10-07 ENCOUNTER — Other Ambulatory Visit: Payer: Self-pay

## 2021-10-07 DIAGNOSIS — R3129 Other microscopic hematuria: Secondary | ICD-10-CM

## 2021-10-12 ENCOUNTER — Encounter: Payer: Self-pay | Admitting: Physician Assistant

## 2021-12-12 ENCOUNTER — Encounter: Payer: Self-pay | Admitting: *Deleted

## 2022-01-06 ENCOUNTER — Ambulatory Visit: Payer: Commercial Managed Care - PPO | Admitting: Physician Assistant

## 2022-01-06 ENCOUNTER — Other Ambulatory Visit: Payer: Self-pay | Admitting: Physician Assistant

## 2022-01-06 DIAGNOSIS — Z1231 Encounter for screening mammogram for malignant neoplasm of breast: Secondary | ICD-10-CM

## 2022-01-11 ENCOUNTER — Ambulatory Visit (INDEPENDENT_AMBULATORY_CARE_PROVIDER_SITE_OTHER): Payer: Commercial Managed Care - PPO | Admitting: Urology

## 2022-01-11 ENCOUNTER — Encounter: Payer: Self-pay | Admitting: Urology

## 2022-01-11 VITALS — BP 137/84 | HR 97 | Ht 67.0 in | Wt 239.0 lb

## 2022-01-11 DIAGNOSIS — R3129 Other microscopic hematuria: Secondary | ICD-10-CM | POA: Diagnosis not present

## 2022-01-11 LAB — MICROSCOPIC EXAMINATION: Bacteria, UA: NONE SEEN

## 2022-01-11 LAB — URINALYSIS, ROUTINE W REFLEX MICROSCOPIC
Bilirubin, UA: NEGATIVE
Glucose, UA: NEGATIVE
Ketones, UA: NEGATIVE
Leukocytes,UA: NEGATIVE
Nitrite, UA: NEGATIVE
Protein,UA: NEGATIVE
Specific Gravity, UA: 1.015 (ref 1.005–1.030)
Urobilinogen, Ur: 1 mg/dL (ref 0.2–1.0)
pH, UA: 8.5 — ABNORMAL HIGH (ref 5.0–7.5)

## 2022-01-11 NOTE — Progress Notes (Signed)
Assessment: 1. Microscopic hematuria     Plan: I reviewed the patient's records including lab results. Today I had a discussion with the patient regarding the findings of microscopic hematuria including the implications and differential diagnoses associated with it.  I also discussed recommendations for further evaluation including the rationale for upper tract imaging and cystoscopy.  I discussed the nature of these procedures including potential risk and complications.  The patient expressed an understanding of these issues. Schedule for CT hematuria protocol followed by cystoscopy.   Chief Complaint:  Chief Complaint  Patient presents with   Hematuria    History of Present Illness:  Taylor Powers is a 51 y.o. female who is seen for evaluation of microscopic hematuria.  She has a history of microscopic hematuria for approximately 5 years.  She has been notified that her urine test show evidence of blood.  No gross hematuria.  Urinalysis from 10/10/2017 showed >30 RBCs. No history of kidney stones or UTIs.  No imaging studies have been performed.  No prior urologic evaluation. She does have a history of tobacco use smoking 1/2 pack/day for approximately 30 years.  She quit for 3 years but has recently resumed smoking.   Past Medical History:  Past Medical History:  Diagnosis Date   Absence of menstruation    Allergic rhinitis, cause unspecified    Anxiety state, unspecified    Arthritis    DDD L4-5; ruptured disc; laminectomy.   Cancer ([place]) 09/18/2010   thyroid cancer; s/p thyroidectomy total; s/p ablation.  [place].   Chicken pox    Diabetes mellitus    Dysplasia of cervix, unspecified    Dysthymic disorder    Hematuria, unspecified    Hypersomnia, unspecified    night shift work   Insomnia, unspecified    Mixed hyperlipidemia    Other abnormal glucose    Other acne    Thyroid disease    Tobacco use disorder    Unspecified vitamin D deficiency     Past Surgical  History:  Past Surgical History:  Procedure Laterality Date   CHOLECYSTECTOMY N/A 04/15/2018   Procedure: LAPAROSCOPIC CHOLECYSTECTOMY WITH INTRAOPERATIVE CHOLANGIOGRAM;  Surgeon: Alphonsa Overall, MD;  Location: [facility];  Service: General;  Laterality: N/A;   history of LEEP  2005   LAMINECTOMY  1996   L4-L5   lipoma of shoulder  2005   Right . removed   SPINE SURGERY  03/20/1994   L4-L5 laminectomy   TOTAL THYROIDECTOMY  2012   TUBAL LIGATION  1998    Allergies:  Allergies  Allergen Reactions   Sulfa Antibiotics Hives    All over body    Family History:  Family History  Problem Relation Age of Onset   Depression Mother    Kidney disease Mother        R Nephrectomy kidney stones   Irritable bowel syndrome Mother    Diabetes Father    Depression Father    ADD / ADHD Brother    ADD / ADHD Brother    Cancer Maternal Grandfather    Hypertension Paternal Grandmother    Breast cancer Daughter    Bipolar disorder Son    ADD / ADHD Son     Social History:  Social History   Tobacco Use   Smoking status: Former    Packs/day: 0.50    Years: 27.00    Total pack years: 13.50    Types: Cigarettes   Smokeless tobacco: Never  Substance Use Topics   Alcohol  use: No    Alcohol/week: 0.0 standard drinks of alcohol   Drug use: No    Review of symptoms:  Constitutional:  Negative for unexplained weight loss, night sweats, fever, chills ENT:  Negative for nose bleeds, sinus pain, painful swallowing CV:  Negative for chest pain, shortness of breath, exercise intolerance, palpitations, loss of consciousness Resp:  Negative for cough, wheezing, shortness of breath GI:  Negative for nausea, vomiting, diarrhea, bloody stools GU:  Positives noted in HPI; otherwise negative for gross hematuria, dysuria, urinary incontinence Neuro:  Negative for seizures, poor balance, limb weakness, slurred speech Psych:  Negative for lack of energy; + depression, anxiety Endocrine:  Negative for  polydipsia, polyuria, symptoms of hypoglycemia (dizziness, hunger, sweating) Hematologic:  Negative for anemia, purpura, petechia, prolonged or excessive bleeding, use of anticoagulants  Allergic:  Negative for difficulty breathing or choking as a result of exposure to anything; no shellfish allergy; no allergic response (rash/itch) to materials, foods  Physical exam: BP 137/84   Pulse 97   Ht '5\' 7"'$  (1.702 m)   Wt 239 lb (108.4 kg)   LMP 03/05/2013   BMI 37.43 kg/m  GENERAL APPEARANCE:  Well appearing, well developed, well nourished, NAD HEENT: Atraumatic, Normocephalic, oropharynx clear. NECK: Supple without lymphadenopathy or thyromegaly. ABDOMEN: Soft, non-tender, No Masses. EXTREMITIES: Moves all extremities well.  Without clubbing, cyanosis, or edema. NEUROLOGIC:  Alert and oriented x 3, normal gait, CN II-XII grossly intact.  MENTAL STATUS:  Appropriate. BACK:  Non-tender to palpation.  No CVAT SKIN:  Warm, dry and intact.    Results: U/A: 3-10 RBCs, 0-5 WBCs

## 2022-01-12 ENCOUNTER — Other Ambulatory Visit (HOSPITAL_COMMUNITY)
Admission: RE | Admit: 2022-01-12 | Discharge: 2022-01-12 | Disposition: A | Discharge status: Home / Self Care | Payer: Commercial Managed Care - PPO | Source: Ambulatory Visit | Attending: Nurse Practitioner | Admitting: Nurse Practitioner

## 2022-01-12 ENCOUNTER — Ambulatory Visit (INDEPENDENT_AMBULATORY_CARE_PROVIDER_SITE_OTHER): Payer: Commercial Managed Care - PPO | Admitting: Nurse Practitioner

## 2022-01-12 ENCOUNTER — Encounter: Payer: Self-pay | Admitting: Nurse Practitioner

## 2022-01-12 VITALS — BP 122/88 | HR 91 | Ht 66.25 in | Wt 243.0 lb

## 2022-01-12 DIAGNOSIS — E28319 Asymptomatic premature menopause: Secondary | ICD-10-CM

## 2022-01-12 DIAGNOSIS — Z01419 Encounter for gynecological examination (general) (routine) without abnormal findings: Secondary | ICD-10-CM

## 2022-01-12 DIAGNOSIS — R87810 Cervical high risk human papillomavirus (HPV) DNA test positive: Secondary | ICD-10-CM | POA: Diagnosis present

## 2022-01-12 DIAGNOSIS — Z708 Other sex counseling: Secondary | ICD-10-CM

## 2022-01-12 NOTE — Progress Notes (Signed)
Taylor Powers 01/10/1971 [id number]   History:  51 y.o. G3P3003 presents as new patient to establish care. Menopause around age 6. Patient had thyroidectomy around that time, mother also menopausal at age 67. No HRT, no bleeding. 2005 LEEP. 2014/2019 normal cytology + HR HPV. Reports HPV positive paps for years. She would like education on HPV. 2015 right breast biopsy - fibroadenoma. Being evaluated for microscopic hematuria. HLD, 2012 thyroid cancer. Restarted smoking last year after daughter's breast cancer diagnosis.   Gynecologic History Patient's last menstrual period was 03/05/2013.   Contraception/Family planning: post menopausal status and tubal ligation Sexually active: Yes  Health Maintenance Last Pap: 05/04/2015. Results were: Normal cytology + HPV Last mammogram: 08/02/2017. Results were: Normal. Scheduled 11/2 Last colonoscopy: Never. Cologuard pending Last Dexa: Not indicated  Past medical history, past surgical history, family history and social history were all reviewed and documented in the EPIC chart. 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   ROS:  A ROS was performed and pertinent positives and negatives are included.  Exam:  Vitals:   01/12/22 0937  BP: 122/88  Pulse: 91  SpO2: 98%  Weight: 243 lb (110.2 kg)  Height: 5' 6.25" (1.683 m)   Body mass index is 38.93 kg/m.  General appearance:  Normal Thyroid:  Symmetrical, normal in size, without palpable masses or nodularity. Respiratory  Auscultation:  Clear without wheezing or rhonchi Cardiovascular  Auscultation:  Regular rate, without rubs, murmurs or gallops  Edema/varicosities:  Not grossly evident Abdominal  Soft,nontender, without masses, guarding or rebound.  Liver/spleen:  No organomegaly noted  Hernia:  None appreciated  Skin  Inspection:  Grossly normal Breasts: Examined lying and sitting.   Right: Without masses, retractions, nipple discharge or axillary adenopathy.   Left: Without masses,  retractions, nipple discharge or axillary adenopathy. Genitourinary   Inguinal/mons:  Normal without inguinal adenopathy  External genitalia:  Normal appearing vulva with no masses, tenderness, or lesions  BUS/Urethra/Skene's glands:  Normal  Vagina:  Normal appearing with normal color and discharge, no lesions  Cervix:  Normal appearing without discharge or lesions  Uterus:  Normal in size, shape and contour.  Midline and mobile, nontender  Adnexa/parametria:     Rt: Normal in size, without masses or tenderness.   Lt: Normal in size, without masses or tenderness.  Anus and perineum: Normal  Digital rectal exam: Deferred  Patient informed chaperone available to be present for breast and pelvic exam. Patient has requested no chaperone to be present. Patient has been advised what will be completed during breast and pelvic exam.   Assessment/Plan:  51 y.o. G3P3003 to establish care.   Well female exam with routine gynecological exam - Plan: Cytology - PAP( [place]). Education provided on SBEs, importance of preventative screenings, current guidelines, high calcium diet, regular exercise, and multivitamin daily. Labs with PCP.   Papanicolaou smear of cervix with positive high risk human papilloma virus (HPV) test - Plan: Cytology - PAP( [place]). 2014/2019 normal cytology + HR HPV. Reports HPV positive paps for years. Pap today.   Early menopause - Plan: DG Bone Density. No HRT. Menopause around age 47. Patient had thyroidectomy around that time, mother also menopausal at age 68. Will get baseline DXA now.   Human papilloma virus (HPV) counseling - Discussed nature of HPV and screening guidelines. All questions answered.  Screening for breast cancer - Normal mammogram history. Overdue but scheduled 01/19/22.  Normal breast exam today.  Screening for colon cancer - Cologuard mailed in this  week.   Return in 1 year for annual.     Tamela Gammon DNP, 10:36 AM 01/12/2022

## 2022-01-13 LAB — CYTOLOGY - PAP
Comment: NEGATIVE
Diagnosis: NEGATIVE
High risk HPV: NEGATIVE

## 2022-01-16 LAB — COLOGUARD: COLOGUARD: NEGATIVE

## 2022-01-17 ENCOUNTER — Encounter: Payer: Self-pay | Admitting: Physician Assistant

## 2022-01-17 ENCOUNTER — Ambulatory Visit (INDEPENDENT_AMBULATORY_CARE_PROVIDER_SITE_OTHER): Payer: Commercial Managed Care - PPO | Admitting: Physician Assistant

## 2022-01-17 VITALS — BP 128/86 | HR 86 | Temp 97.7°F | Ht 66.25 in | Wt 242.8 lb

## 2022-01-17 DIAGNOSIS — F17210 Nicotine dependence, cigarettes, uncomplicated: Secondary | ICD-10-CM

## 2022-01-17 DIAGNOSIS — R3129 Other microscopic hematuria: Secondary | ICD-10-CM | POA: Diagnosis not present

## 2022-01-17 DIAGNOSIS — Z23 Encounter for immunization: Secondary | ICD-10-CM | POA: Diagnosis not present

## 2022-01-17 NOTE — Progress Notes (Signed)
Subjective:    Patient ID: Taylor Powers, female    DOB: 01-28-1971, 51 y.o.   MRN: [id number]  Chief Complaint  Patient presents with   Follow-up    Pt in for f/u; pt just had labs drawn in July; Cologuard completed as well; pt has no concerns otherwise; Urologist wants CT and also Dexa been ordered by GYN.    HPI Patient is in today for follow-up.  She is also needing her flu shot and varicella vaccine updated today.  Patient tells me that she has an appointment with a new counselor coming up next month, as she will be using their services to help with smoking cessation.  She previously quit 3 years ago cold [place], but started back after her daughter was sick.  She completed Cologuard, which was negative.  She had her well woman exam completed with Dr. Juleen China and her Pap was normal.  She also had a urology appointment with Dr. Felipa Eth.  She was feeling well and has plans to try to start hiking.  Past Medical History:  Diagnosis Date   Absence of menstruation    Allergic rhinitis, cause unspecified    Anxiety state, unspecified    Arthritis    DDD L4-5; ruptured disc; laminectomy.   Cancer ([place]) 09/18/2010   thyroid cancer; s/p thyroidectomy total; s/p ablation.  [facility].   Chicken pox    Diabetes mellitus    Dysplasia of cervix, unspecified    Dysthymic disorder    Hematuria, unspecified    Hypersomnia, unspecified    night shift work   Insomnia, unspecified    Mixed hyperlipidemia    Other abnormal glucose    Other acne    Thyroid disease    Tobacco use disorder    Unspecified vitamin D deficiency     Past Surgical History:  Procedure Laterality Date   CHOLECYSTECTOMY N/A 04/15/2018   Procedure: LAPAROSCOPIC CHOLECYSTECTOMY WITH INTRAOPERATIVE CHOLANGIOGRAM;  Surgeon: Alphonsa Overall, MD;  Location: [facility];  Service: General;  Laterality: N/A;   history of LEEP  2005   LAMINECTOMY  1996   L4-L5   lipoma of shoulder  2005   Right . removed   SPINE SURGERY  03/20/1994    L4-L5 laminectomy   TOTAL THYROIDECTOMY  2012   TUBAL LIGATION  1998    Family History  Problem Relation Age of Onset   Depression Mother    Kidney disease Mother        R Nephrectomy kidney stones   Irritable bowel syndrome Mother    Diabetes Father    Depression Father    ADD / ADHD Brother    ADD / ADHD Brother    Cancer Maternal Grandfather    Hypertension Paternal Grandmother    Heart attack Paternal Grandfather    Breast cancer Daughter    Bipolar disorder Son    ADD / ADHD Son     Social History   Tobacco Use   Smoking status: Every Day    Packs/day: 0.25    Types: Cigarettes   Smokeless tobacco: Never   Tobacco comments:    Did quit but restarted back last year due to daughters Diangosis of stage 4 breast cancer, currently in processing of quitting again.   Substance Use Topics   Alcohol use: No    Alcohol/week: 0.0 standard drinks of alcohol   Drug use: No     Allergies  Allergen Reactions   Sulfa Antibiotics Hives    All over body  Review of Systems NEGATIVE UNLESS OTHERWISE INDICATED IN HPI      Objective:     BP 128/86 (BP Location: Left Arm)   Pulse 86   Temp 97.7 F (36.5 C) (Temporal)   Ht 5' 6.25" (1.683 m)   Wt 242 lb 12.8 oz (110.1 kg)   LMP 03/05/2013   SpO2 96%   BMI 38.89 kg/m   Wt Readings from Last 3 Encounters:  01/17/22 242 lb 12.8 oz (110.1 kg)  01/12/22 243 lb (110.2 kg)  01/11/22 239 lb (108.4 kg)    BP Readings from Last 3 Encounters:  01/17/22 128/86  01/12/22 122/88  01/11/22 137/84     Physical Exam Vitals and nursing note reviewed.  Constitutional:      Appearance: Normal appearance. She is obese.  Cardiovascular:     Rate and Rhythm: Normal rate and regular rhythm.     Pulses: Normal pulses.     Heart sounds: Normal heart sounds. No murmur heard. Pulmonary:     Effort: Pulmonary effort is normal.     Breath sounds: Normal breath sounds.  Neurological:     Mental Status: She is alert and oriented  to person, place, and time.  Psychiatric:        Mood and Affect: Mood normal.        Behavior: Behavior normal.        Thought Content: Thought content normal.        Assessment & Plan:  Microscopic hematuria Assessment & Plan: She recently had initial consult with Dr. Felipa Eth at [facility] urology.  She is going to have a cystoscopy and a low-dose pelvic CT to rule out any bad etiology.   Cigarette nicotine dependence without complication Assessment & Plan: Excited for her that she is motivated to quit smoking and about ready to start this process.  Glad to see she will be meeting with a counselor to help her through this.  Offered medication help if she needs it.  She will let me know.   Need for prophylactic vaccination and inoculation against varicella -     Varicella-zoster vaccine IM  Need for immunization against influenza -     Flu Vaccine QUAD 36moIM (Fluarix, Fluzone & Alfiuria Quad PF)       Return for summer months for regular recheck, fasting labs .  This note was prepared with assistance of [employer] analyst Occasional wrong-word or sound-a-like substitutions may have occurred due to the inherent limitations of voice recognition software.     Murrell Elizondo M Mckynzi Cammon, PA-C

## 2022-01-17 NOTE — Assessment & Plan Note (Signed)
Excited for her that she is motivated to quit smoking and about ready to start this process.  Glad to see she will be meeting with a counselor to help her through this.  Offered medication help if she needs it.  She will let me know.

## 2022-01-17 NOTE — Assessment & Plan Note (Signed)
She recently had initial consult with Dr. Felipa Eth at [facility] urology.  She is going to have a cystoscopy and a low-dose pelvic CT to rule out any bad etiology.

## 2022-01-19 ENCOUNTER — Ambulatory Visit
Admission: RE | Admit: 2022-01-19 | Discharge: 2022-01-19 | Disposition: A | Discharge status: Home / Self Care | Payer: Commercial Managed Care - PPO | Source: Ambulatory Visit

## 2022-01-19 DIAGNOSIS — Z1231 Encounter for screening mammogram for malignant neoplasm of breast: Secondary | ICD-10-CM

## 2022-01-19 LAB — HM MAMMOGRAPHY

## 2022-02-02 ENCOUNTER — Encounter: Payer: Self-pay | Admitting: Physician Assistant

## 2022-03-09 ENCOUNTER — Telehealth (INDEPENDENT_AMBULATORY_CARE_PROVIDER_SITE_OTHER): Payer: Self-pay | Admitting: Psychiatry

## 2022-03-09 ENCOUNTER — Encounter: Payer: Self-pay | Admitting: Psychiatry

## 2022-03-09 DIAGNOSIS — F3342 Major depressive disorder, recurrent, in full remission: Secondary | ICD-10-CM

## 2022-03-09 DIAGNOSIS — F900 Attention-deficit hyperactivity disorder, predominantly inattentive type: Secondary | ICD-10-CM

## 2022-03-09 MED ORDER — LISDEXAMFETAMINE DIMESYLATE 40 MG PO CAPS: 40.0000 mg | ORAL_CAPSULE | ORAL | 0 refills | Status: DC

## 2022-03-09 MED ORDER — SERTRALINE HCL 100 MG PO TABS: 100.0000 mg | ORAL_TABLET | Freq: Every day | ORAL | 1 refills | Status: DC

## 2022-03-09 MED ORDER — BUPROPION HCL ER (XL) 300 MG PO TB24: 300.0000 mg | ORAL_TABLET | Freq: Every day | ORAL | 1 refills | Status: DC

## 2022-03-09 NOTE — Progress Notes (Signed)
Taylor Powers [id number] 1970-08-18 51 y.o.  Virtual Visit via Video Note  I connected with pt on 03/09/22 at  1:00 PM EST by a video enabled telemedicine application and verified that I am speaking with the correct person using two identifiers.   I discussed the limitations of evaluation and management by telemedicine and the availability of in person appointments. The patient expressed understanding and agreed to proceed.  I discussed the assessment and treatment plan with the patient. The patient was provided an opportunity to ask questions and all were answered. The patient agreed with the plan and demonstrated an understanding of the instructions.   The patient was advised to call back or seek an in-person evaluation if the symptoms worsen or if the condition fails to improve as anticipated.  I provided 25 minutes of non-face-to-face time during this encounter.  The patient was located at home.  The provider was located at home.   Thayer Headings, PMHNP   Subjective:   Patient ID:  Taylor Powers is a 51 y.o. (DOB [birth year]) female.  Chief Complaint:  Chief Complaint  Patient presents with   Follow-up    ADD, ADHD, depression    HPI Taylor Powers presents for follow-up of ADD, anxiety, and depression. She reports that she is doing well overall aside from acute illness. She has been in bed the last 2 days. She reports that her anxiety is "very well managed." She reports that she has had some depression related to daughter's illness. She has started seeing a therapist to process events with daughter. She reports that depression is improving. Mood improved after daughter received more positive prognosis. Energy and motivation were lower with depression and this has improved. She reports recent weight gain. Concentration has been ok. Only taking Vyvanse on work days. She reports that Vyvanse 40 mg is adequate. Improved interest in things. She plans to re-start Weight watchers with her  sister-in-law. Sleeping well and getting at least 7 hours a night most nights. Denies SI.   Daughter has been responding well to new treatments. She reports that her work is going well and supervisor is supportive. 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   Seeing Emilee Hero, LCSW for therapy.   Vyvanse last filled 01/20/22.   Past Psychiatric Medication Trials: Lexapro- Took after her divorce Wellbutrin XL Vyvanse- Effective Sertraline- 100 mg is effective. Noticed some affective dulling at 150 mg     Review of Systems:  Review of Systems  HENT:  Positive for congestion.   Cardiovascular:  Negative for palpitations.  Musculoskeletal:  Negative for gait problem.  Neurological:        Bell's Palsy  Psychiatric/Behavioral:         Please refer to HPI    Medications: I have reviewed the patient's current medications.  Current Outpatient Medications  Medication Sig Dispense Refill   Cholecalciferol (VITAMIN D3) 25 MCG (1000 UT) CAPS Take 2,000 Units by mouth.     doxycycline (VIBRAMYCIN) 100 MG capsule Take 100 mg by mouth 2 (two) times daily.     [START ON 04/06/2022] lisdexamfetamine (VYVANSE) 40 MG capsule Take 1 capsule (40 mg total) by mouth every morning. 30 capsule 0   lisdexamfetamine (VYVANSE) 40 MG capsule Take 1 capsule (40 mg total) by mouth every morning. 30 capsule 0   Multiple Vitamin (MULTIVITAMIN WITH MINERALS) TABS tablet Take 1 tablet by mouth daily.     Omega-3 Fatty  Acids (FISH OIL) 1000 MG CAPS Take 1,000 mg by mouth 2 (two) times daily.     predniSONE (STERAPRED UNI-PAK 48 TAB) 10 MG (48) TBPK tablet Take 2 tablets by mouth 2 (two) times daily.     buPROPion (WELLBUTRIN XL) 300 MG 24 hr tablet Take 1 tablet (300 mg total) by mouth daily. 90 tablet 1   levothyroxine (SYNTHROID) 150 MCG tablet TAKE 1 TABLET (150 MCG TOTAL) BY MOUTH DAILY BEFORE BREAKFAST. SCHEDULE OFFICE VISIT 90 tablet 0   [START ON  05/04/2022] lisdexamfetamine (VYVANSE) 40 MG capsule Take 1 capsule (40 mg total) by mouth every morning. 30 capsule 0   sertraline (ZOLOFT) 100 MG tablet Take 1 tablet (100 mg total) by mouth daily. 90 tablet 1   No current facility-administered medications for this visit.    Medication Side Effects: None  Allergies:  Allergies  Allergen Reactions   Sulfa Antibiotics Hives    All over body    Past Medical History:  Diagnosis Date   Absence of menstruation    Allergic rhinitis, cause unspecified    Anxiety state, unspecified    Arthritis    DDD L4-5; ruptured disc; laminectomy.   Cancer ([place]) 09/18/2010   thyroid cancer; s/p thyroidectomy total; s/p ablation.  [facility].   Chicken pox    Diabetes mellitus    Dysplasia of cervix, unspecified    Dysthymic disorder    Hematuria, unspecified    Hypersomnia, unspecified    night shift work   Insomnia, unspecified    Mixed hyperlipidemia    Other abnormal glucose    Other acne    Thyroid disease    Tobacco use disorder    Unspecified vitamin D deficiency     Family History  Problem Relation Age of Onset   Depression Mother    Kidney disease Mother        R Nephrectomy kidney stones   Irritable bowel syndrome Mother    Diabetes Father    Depression Father    ADD / ADHD Brother    ADD / ADHD Brother    Cancer Maternal Grandfather    Hypertension Paternal Grandmother    Heart attack Paternal Grandfather    Breast cancer Daughter    Bipolar disorder Son    ADD / ADHD Son     Social History   Socioeconomic History   Marital status: Married    Spouse name: Dawayne Cirri   Number of children: 3   Years of education: Not on file   Highest education level: Not on file  Occupational History   Occupation: Psych ED    Employer: [employer]   Occupation: Programmer, multimedia: [employer]  Tobacco Use   Smoking status: Every Day    Packs/day: 0.25    Types: Cigarettes   Smokeless tobacco: Never   Tobacco comments:     Did quit but restarted back last year due to daughters Diangosis of stage 4 breast cancer, currently in processing of quitting again.   Substance and Sexual Activity   Alcohol use: No    Alcohol/week: 0.0 standard drinks of alcohol   Drug use: No   Sexual activity: Not Currently    Partners: Female    Birth control/protection: Post-menopausal    Comment: same sexual partner  Other Topics Concern   Not on file  Social History Narrative   Marital status:  Married same sex partner 12/2013 Dawayne Cirri. Happy, no abuse.      Children:  3 children (24, 22, 20); 3 grandchildren.        Lives: with wife.      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  Two sixteen hour shifts followed by 8 hour shift; weekends.  Off Mon-Thurs.  [employer] job.  Graduate school in 2019.      Tobacco: 1 ppd x 30 years.        Alcohol: none; partner/wife in recovery.      Drugs: none      Exercise: none      Caffeine use: diet Pepsi and coffee.      Always uses seat belts.       Smoke alarm and carbon monoxide detector in the home.       No guns.    Social Determinants of Health   Financial Resource Strain: Not on file  Food Insecurity: Not on file  Transportation Needs: Not on file  Physical Activity: Not on file  Stress: Not on file  Social Connections: Not on file  Intimate Partner Violence: Not on file    Past Medical History, Surgical history, Social history, and Family history were reviewed and updated as appropriate.   Please see review of systems for further details on the patient's review from today.   Objective:   Physical Exam:  BP 128/86   Pulse 78   LMP 03/05/2013   Physical Exam Constitutional:      General: She is not in acute distress. Musculoskeletal:        General: No deformity.  Neurological:     Mental Status: She is alert and oriented to person, place, and time.     Coordination: Coordination normal.  Psychiatric:         Attention and Perception: Attention and perception normal. She does not perceive auditory or visual hallucinations.        Mood and Affect: Mood is not anxious. Affect is not labile, blunt, angry or inappropriate.        Speech: Speech normal.        Behavior: Behavior normal.        Thought Content: Thought content normal. Thought content is not paranoid or delusional. Thought content does not include homicidal or suicidal ideation. Thought content does not include homicidal or suicidal plan.        Cognition and Memory: Cognition and memory normal.        Judgment: Judgment normal.     Comments: Insight intact Mood is mildly depressed     Lab Review:     Component Value Date/Time   NA 137 10/05/2021 0853   NA 141 12/18/2018 0937   K 4.4 10/05/2021 0853   CL 103 10/05/2021 0853   CO2 27 10/05/2021 0853   GLUCOSE 99 10/05/2021 0853   BUN 16 10/05/2021 0853   BUN 14 12/18/2018 0937   CREATININE 0.91 10/05/2021 0853   CREATININE 0.77 11/24/2015 1519   CALCIUM 9.1 10/05/2021 0853   PROT 7.1 10/05/2021 0853   PROT 7.2 12/18/2018 0937   ALBUMIN 4.4 10/05/2021 0853   ALBUMIN 4.5 12/18/2018 0937   AST 17 10/05/2021 0853   ALT 23 10/05/2021 0853   ALKPHOS 74 10/05/2021 0853   BILITOT 0.4 10/05/2021 0853   BILITOT 0.4 12/18/2018 0937   GFRNONAA 69 12/18/2018 0937   GFRNONAA >89 06/17/2013 0909   GFRAA 80 12/18/2018 0937   GFRAA >89 06/17/2013 0909       Component Value Date/Time  WBC 4.7 10/05/2021 0853   RBC 4.20 10/05/2021 0853   HGB 12.5 10/05/2021 0853   HGB 12.4 12/18/2018 0937   HCT 36.4 10/05/2021 0853   HCT 36.9 12/18/2018 0937   PLT 180.0 10/05/2021 0853   PLT 188 12/18/2018 0937   MCV 86.7 10/05/2021 0853   MCV 87 12/18/2018 0937   MCH 29.2 12/18/2018 0937   MCH 30.0 11/24/2015 1532   MCH 28.5 10/14/2014 0820   MCHC 34.2 10/05/2021 0853   RDW 13.6 10/05/2021 0853   RDW 12.9 12/18/2018 0937   LYMPHSABS 1.7 10/05/2021 0853   LYMPHSABS 1.9 12/18/2018 0937    MONOABS 0.3 10/05/2021 0853   EOSABS 0.1 10/05/2021 0853   EOSABS 0.1 12/18/2018 0937   BASOSABS 0.0 10/05/2021 0853   BASOSABS 0.0 12/18/2018 0937    No results found for: "POCLITH", "LITHIUM"   No results found for: "PHENYTOIN", "PHENOBARB", "VALPROATE", "CBMZ"   .res Assessment: Plan:   Pt seen for 25 minutes and time spent discussing recent depression in response to daughter having cancer. She reports that she has started therapy and that depressive s/s are improving. She reports that her medications have been effective overall and she would like to continue current medications without changes at this time.  She reports that she has not been taking Vyvanse while taking Prednisone for acute illness and that she has been taking Vyvanse prn.  Continue Sertraline 100 mg daily for anxiety and depression.  Continue Vyvanse 40 mg daily for ADHD.  Continue Wellbutrin XL 300 mg daily for depression. Pt to follow-up in 6 months or sooner if clinically indicated.  Requested pt call in 3 months to provide update and request additional scripts.  Patient advised to contact office with any questions, adverse effects, or acute worsening in signs and symptoms.   Taylor Powers was seen today for follow-up.  Diagnoses and all orders for this visit:  Attention deficit hyperactivity disorder (ADHD), predominantly inattentive type -     lisdexamfetamine (VYVANSE) 40 MG capsule; Take 1 capsule (40 mg total) by mouth every morning. -     lisdexamfetamine (VYVANSE) 40 MG capsule; Take 1 capsule (40 mg total) by mouth every morning. -     lisdexamfetamine (VYVANSE) 40 MG capsule; Take 1 capsule (40 mg total) by mouth every morning.  Recurrent major depressive disorder, in full remission ([place]) -     buPROPion (WELLBUTRIN XL) 300 MG 24 hr tablet; Take 1 tablet (300 mg total) by mouth daily. -     sertraline (ZOLOFT) 100 MG tablet; Take 1 tablet (100 mg total) by mouth daily.     Please see After Visit Summary  for patient specific instructions.  Future Appointments  Date Time Provider [place]  03/15/2022  9:00 AM Allwardt, Randa Evens, PA-C [facility]  08/28/2022  8:00 AM Allwardt, Randa Evens, PA-C [facility]    No orders of the defined types were placed in this encounter.     -------------------------------

## 2022-03-15 ENCOUNTER — Ambulatory Visit: Payer: Commercial Managed Care - PPO | Admitting: Physician Assistant

## 2022-06-06 ENCOUNTER — Ambulatory Visit: Payer: Commercial Managed Care - PPO | Admitting: Physician Assistant

## 2022-08-08 ENCOUNTER — Other Ambulatory Visit: Payer: Self-pay

## 2022-08-08 DIAGNOSIS — F3342 Major depressive disorder, recurrent, in full remission: Secondary | ICD-10-CM

## 2022-08-08 MED ORDER — SERTRALINE HCL 100 MG PO TABS
100.0000 mg | ORAL_TABLET | Freq: Every day | ORAL | 0 refills | Status: DC
Start: 2022-08-08 — End: 2022-09-06

## 2022-08-28 ENCOUNTER — Ambulatory Visit: Payer: Commercial Managed Care - PPO | Admitting: Physician Assistant

## 2022-09-06 ENCOUNTER — Encounter: Payer: Self-pay | Admitting: Psychiatry

## 2022-09-06 ENCOUNTER — Telehealth (INDEPENDENT_AMBULATORY_CARE_PROVIDER_SITE_OTHER): Payer: Commercial Managed Care - PPO | Admitting: Psychiatry

## 2022-09-06 DIAGNOSIS — F900 Attention-deficit hyperactivity disorder, predominantly inattentive type: Secondary | ICD-10-CM

## 2022-09-06 DIAGNOSIS — F3342 Major depressive disorder, recurrent, in full remission: Secondary | ICD-10-CM

## 2022-09-06 MED ORDER — SERTRALINE HCL 100 MG PO TABS
100.0000 mg | ORAL_TABLET | Freq: Every day | ORAL | 1 refills | Status: AC
Start: 2022-09-06 — End: ?

## 2022-09-06 MED ORDER — LISDEXAMFETAMINE DIMESYLATE 40 MG PO CAPS: 40.0000 mg | ORAL_CAPSULE | ORAL | 0 refills | Status: DC

## 2022-09-06 MED ORDER — LISDEXAMFETAMINE DIMESYLATE 40 MG PO CAPS
40.0000 mg | ORAL_CAPSULE | ORAL | 0 refills | Status: DC
Start: 2022-11-01 — End: 2023-01-25

## 2022-09-06 MED ORDER — BUPROPION HCL ER (XL) 300 MG PO TB24: 300.0000 mg | ORAL_TABLET | Freq: Every day | ORAL | 1 refills | Status: DC

## 2022-09-06 NOTE — [facility] Notes (Signed)
Taylor Powers [id number] 1970/05/07 52 y.o.  Virtual Visit via Video Note  I connected with pt @ on 09/06/22 at  9:30 AM EDT by a video enabled telemedicine application and verified that I am speaking with the correct person using two identifiers.   I discussed the limitations of evaluation and management by telemedicine and the availability of in person appointments. The patient expressed understanding and agreed to proceed.  I discussed the assessment and treatment plan with the patient. The patient was provided an opportunity to ask questions and all were answered. The patient agreed with the plan and demonstrated an understanding of the instructions.   The patient was advised to call back or seek an in-person evaluation if the symptoms worsen or if the condition fails to improve as anticipated.  I provided 17 minutes of non-face-to-face time during this encounter.  The patient was located at work.  The provider was located at [facility].   Corie Chiquito, PMHNP   Subjective:   Patient ID:  Taylor Powers is a 52 y.o. (DOB 05/27/1970) female.  Chief Complaint:  Chief Complaint  Patient presents with   Follow-up    Depression, anxiety, and ADHD    HPI Taylor Powers presents for follow-up of anxiety, ADHD, and depression. Her daughter passed away in late 12-Jun-2022 and "was in a slump." She reports that daughter received  excellent care while in the hospital. She reports that she returned to work about 1.5 weeks later. Denies depression. She reports that she has had a "slump" here and there. Energy and motivation have been improving. Appetite has been ok. Has intentionally lost about 10 lbs. She reports poor concentration if she does not take Vyvanse. Did not take Vyvanse while caring for daughter. Sleep has improved since starting L-Methylfolate. Denies irritability. Denies SI.   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 She reports that her boss and  coworkers have been very supportive.  She has been trying to be healthier.   Vyvanse last filled 07/26/22.    Past Psychiatric Medication Trials: Lexapro- Took after her divorce Wellbutrin XL Vyvanse- Effective Sertraline- 100 mg is effective. Noticed some affective dulling at 150 mg  L-methylfolate    Review of Systems:  Review of Systems  Cardiovascular:  Negative for palpitations.  Musculoskeletal:  Negative for gait problem.  Neurological:  Negative for tremors.  Psychiatric/Behavioral:         Please refer to HPI    Medications: I have reviewed the patient's current medications.  Current Outpatient Medications  Medication Sig Dispense Refill   Cholecalciferol (VITAMIN D3) 25 MCG (1000 UT) CAPS Take 2,000 Units by mouth.     L-Methylfolate 15 MG TABS Take 15 mg by mouth daily.     buPROPion (WELLBUTRIN XL) 300 MG 24 hr tablet Take 1 tablet (300 mg total) by mouth daily. 90 tablet 1   levothyroxine (SYNTHROID) 150 MCG tablet TAKE 1 TABLET (150 MCG TOTAL) BY MOUTH DAILY BEFORE BREAKFAST. SCHEDULE OFFICE VISIT 90 tablet 0   lisdexamfetamine (VYVANSE) 40 MG capsule Take 1 capsule (40 mg total) by mouth every morning. 30 capsule 0   [START ON 10/04/2022] lisdexamfetamine (VYVANSE) 40 MG capsule Take 1 capsule (40 mg total) by mouth every morning. 30 capsule 0   [START ON 11/01/2022] lisdexamfetamine (VYVANSE) 40 MG capsule Take 1 capsule (40 mg total) by mouth every morning. 30 capsule 0   Multiple Vitamin (MULTIVITAMIN WITH MINERALS) TABS tablet Take 1 tablet by mouth  daily.     Omega-3 Fatty Acids (FISH OIL) 1000 MG CAPS Take 1,000 mg by mouth 2 (two) times daily.     sertraline (ZOLOFT) 100 MG tablet Take 1 tablet (100 mg total) by mouth daily. 90 tablet 1   No current facility-administered medications for this visit.    Medication Side Effects: None  Allergies:  Allergies  Allergen Reactions   Sulfa Antibiotics Hives    All over body    Past Medical History:   Diagnosis Date   Absence of menstruation    Allergic rhinitis, cause unspecified    Anxiety state, unspecified    Arthritis    DDD L4-5; ruptured disc; laminectomy.   Cancer ([facility]) 09/18/2010   thyroid cancer; s/p thyroidectomy total; s/p ablation.  [facility].   Chicken pox    Diabetes mellitus    Dysplasia of cervix, unspecified    Dysthymic disorder    Hematuria, unspecified    Hypersomnia, unspecified    night shift work   Insomnia, unspecified    Mixed hyperlipidemia    Other abnormal glucose    Other acne    Thyroid disease    Tobacco use disorder    Unspecified vitamin D deficiency     Family History  Problem Relation Age of Onset   Depression Mother    Kidney disease Mother        R Nephrectomy kidney stones   Irritable bowel syndrome Mother    Diabetes Father    Depression Father    ADD / ADHD Brother    ADD / ADHD Brother    Cancer Maternal Grandfather    Hypertension Paternal Grandmother    Heart attack Paternal Grandfather    Breast cancer Daughter    Bipolar disorder Son    ADD / ADHD Son     Social History   Socioeconomic History   Marital status: Married    Spouse name: Risa Grill   Number of children: 3   Years of education: Not on file   Highest education level: Not on file  Occupational History   Occupation: Psych ED    Employer: [employer]   Occupation: Teacher, adult education: [employer]  Tobacco Use   Smoking status: Every Day    Packs/day: .25    Types: Cigarettes   Smokeless tobacco: Never   Tobacco comments:    Did quit but restarted back last year due to daughters Diangosis of stage 4 breast cancer, currently in processing of quitting again.   Substance and Sexual Activity   Alcohol use: No    Alcohol/week: 0.0 standard drinks of alcohol   Drug use: No   Sexual activity: Not Currently    Partners: Female    Birth control/protection: Post-menopausal    Comment: same sexual partner  Other Topics Concern   Not on file  Social  History Narrative   Marital status:  Married same sex partner 12/2013 Risa Grill. Happy, no abuse.      Children:  3 children (24, 22, 20); 3 grandchildren.        Lives: with wife.      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  Two sixteen hour shifts followed by 8 hour shift; weekends.  Off Mon-Thurs.  Loves job.  Graduate school in 2019.      Tobacco: 1 ppd x 30 years.        Alcohol: none; partner/wife in recovery.      Drugs:  none      Exercise: none      Caffeine use: diet Pepsi and coffee.      Always uses seat belts.       Smoke alarm and carbon monoxide detector in the home.       No guns.    Social Determinants of Health   Financial Resource Strain: Not on file  Food Insecurity: Not on file  Transportation Needs: Not on file  Physical Activity: Not on file  Stress: Not on file  Social Connections: Not on file  Intimate Partner Violence: Not on file    Past Medical History, Surgical history, Social history, and Family history were reviewed and updated as appropriate.   Please see review of systems for further details on the patient's review from today.   Objective:   Physical Exam:  LMP 03/05/2013   Physical Exam Neurological:     Mental Status: She is alert and oriented to person, place, and time.     Cranial Nerves: No dysarthria.  Psychiatric:        Attention and Perception: Attention and perception normal.        Speech: Speech normal.        Behavior: Behavior is cooperative.        Thought Content: Thought content normal. Thought content is not paranoid or delusional. Thought content does not include homicidal or suicidal ideation. Thought content does not include homicidal or suicidal plan.        Cognition and Memory: Cognition and memory normal.        Judgment: Judgment normal.     Comments: Insight intact Mood is appropriate to content. Affect is congruent.      Lab Review:     Component Value  Date/Time   NA 137 10/05/2021 0853   NA 141 12/18/2018 0937   K 4.4 10/05/2021 0853   CL 103 10/05/2021 0853   CO2 27 10/05/2021 0853   GLUCOSE 99 10/05/2021 0853   BUN 16 10/05/2021 0853   BUN 14 12/18/2018 0937   CREATININE 0.91 10/05/2021 0853   CREATININE 0.77 11/24/2015 1519   CALCIUM 9.1 10/05/2021 0853   PROT 7.1 10/05/2021 0853   PROT 7.2 12/18/2018 0937   ALBUMIN 4.4 10/05/2021 0853   ALBUMIN 4.5 12/18/2018 0937   AST 17 10/05/2021 0853   ALT 23 10/05/2021 0853   ALKPHOS 74 10/05/2021 0853   BILITOT 0.4 10/05/2021 0853   BILITOT 0.4 12/18/2018 0937   GFRNONAA 69 12/18/2018 0937   GFRNONAA >89 06/17/2013 0909   GFRAA 80 12/18/2018 0937   GFRAA >89 06/17/2013 0909       Component Value Date/Time   WBC 4.7 10/05/2021 0853   RBC 4.20 10/05/2021 0853   HGB 12.5 10/05/2021 0853   HGB 12.4 12/18/2018 0937   HCT 36.4 10/05/2021 0853   HCT 36.9 12/18/2018 0937   PLT 180.0 10/05/2021 0853   PLT 188 12/18/2018 0937   MCV 86.7 10/05/2021 0853   MCV 87 12/18/2018 0937   MCH 29.2 12/18/2018 0937   MCH 30.0 11/24/2015 1532   MCH 28.5 10/14/2014 0820   MCHC 34.2 10/05/2021 0853   RDW 13.6 10/05/2021 0853   RDW 12.9 12/18/2018 0937   LYMPHSABS 1.7 10/05/2021 0853   LYMPHSABS 1.9 12/18/2018 0937   MONOABS 0.3 10/05/2021 0853   EOSABS 0.1 10/05/2021 0853   EOSABS 0.1 12/18/2018 0937   BASOSABS 0.0 10/05/2021 0853   BASOSABS 0.0 12/18/2018 0937    No results  found for: "POCLITH", "LITHIUM"   No results found for: "PHENYTOIN", "PHENOBARB", "VALPROATE", "CBMZ"   .res Assessment: Plan:    Pt reports that she has been grieving the loss of her daughter and has had anticipated sadness in response to this loss without persistent depression. She reports that medications remain helpful for her mood and concentration. Will continue current plan of care without changes.  Continue Vyvanse 40 mg daily for ADHD.  Continue Wellbutrin XL 300 mg daily for depression.  Continue  Sertraline 100 mg daily for anxiety and depression.  Agree with starting L-methylfolate for supplementation of mood.  Patient advised to contact office with any questions, adverse effects, or acute worsening in signs and symptoms. Pt to follow-up in 6 months or sooner if clinically indicated.  Requested pt call in 3 months to provide update and request additional scripts.    Graesyn was seen today for follow-up.  Diagnoses and all orders for this visit:  Attention deficit hyperactivity disorder (ADHD), predominantly inattentive type -     lisdexamfetamine (VYVANSE) 40 MG capsule; Take 1 capsule (40 mg total) by mouth every morning. -     lisdexamfetamine (VYVANSE) 40 MG capsule; Take 1 capsule (40 mg total) by mouth every morning. -     lisdexamfetamine (VYVANSE) 40 MG capsule; Take 1 capsule (40 mg total) by mouth every morning.  Recurrent major depressive disorder, in full remission ([facility]) -     buPROPion (WELLBUTRIN XL) 300 MG 24 hr tablet; Take 1 tablet (300 mg total) by mouth daily. -     sertraline (ZOLOFT) 100 MG tablet; Take 1 tablet (100 mg total) by mouth daily.     Please see After Visit Summary for patient specific instructions.  No future appointments.  No orders of the defined types were placed in this encounter.     -------------------------------

## 2023-01-25 ENCOUNTER — Other Ambulatory Visit: Payer: Self-pay

## 2023-01-25 ENCOUNTER — Telehealth: Payer: Self-pay | Admitting: Psychiatry

## 2023-01-25 DIAGNOSIS — F900 Attention-deficit hyperactivity disorder, predominantly inattentive type: Secondary | ICD-10-CM

## 2023-01-25 MED ORDER — LISDEXAMFETAMINE DIMESYLATE 40 MG PO CAPS
40.0000 mg | ORAL_CAPSULE | ORAL | 0 refills | Status: DC
Start: 2023-02-22 — End: 2023-05-21

## 2023-01-25 MED ORDER — LISDEXAMFETAMINE DIMESYLATE 40 MG PO CAPS
40.0000 mg | ORAL_CAPSULE | ORAL | 0 refills | Status: DC
Start: 2023-01-25 — End: 2023-05-21

## 2023-01-25 MED ORDER — LISDEXAMFETAMINE DIMESYLATE 40 MG PO CAPS
40.0000 mg | ORAL_CAPSULE | ORAL | 0 refills | Status: AC
Start: 2023-03-22 — End: ?

## 2023-01-25 NOTE — Telephone Encounter (Signed)
Taylor Powers responded to [employer]. She is doing well on the Vyvanse and does want more Rfs sent in. Please RF.

## 2023-01-25 NOTE — Telephone Encounter (Signed)
Pended.

## 2023-01-31 ENCOUNTER — Encounter: Payer: Self-pay | Admitting: Psychiatry

## 2023-03-17 ENCOUNTER — Telehealth: Payer: Self-pay

## 2023-03-18 ENCOUNTER — Other Ambulatory Visit: Payer: Self-pay

## 2023-03-18 DIAGNOSIS — F3342 Major depressive disorder, recurrent, in full remission: Secondary | ICD-10-CM

## 2023-03-18 MED ORDER — BUPROPION HCL ER (XL) 300 MG PO TB24
300.0000 mg | ORAL_TABLET | Freq: Every day | ORAL | 0 refills | Status: AC
Start: 2023-03-18 — End: ?

## 2023-03-26 NOTE — Telephone Encounter (Signed)
 Prior Authorization Vyvanse 40 mg #30/30 [facility]  Denied, needs step therapy

## 2023-05-21 ENCOUNTER — Ambulatory Visit (INDEPENDENT_AMBULATORY_CARE_PROVIDER_SITE_OTHER): Payer: Self-pay | Admitting: Physician Assistant

## 2023-05-21 VITALS — BP 116/80 | HR 98 | Temp 97.7°F | Ht 66.25 in | Wt 246.8 lb

## 2023-05-21 DIAGNOSIS — Z13220 Encounter for screening for lipoid disorders: Secondary | ICD-10-CM

## 2023-05-21 DIAGNOSIS — Z0000 Encounter for general adult medical examination without abnormal findings: Secondary | ICD-10-CM | POA: Diagnosis not present

## 2023-05-21 DIAGNOSIS — E890 Postprocedural hypothyroidism: Secondary | ICD-10-CM | POA: Diagnosis not present

## 2023-05-21 DIAGNOSIS — Z131 Encounter for screening for diabetes mellitus: Secondary | ICD-10-CM

## 2023-05-21 DIAGNOSIS — F418 Other specified anxiety disorders: Secondary | ICD-10-CM

## 2023-05-21 LAB — COMPREHENSIVE METABOLIC PANEL
ALT: 38 U/L — ABNORMAL HIGH (ref 0–35)
AST: 23 U/L (ref 0–37)
Albumin: 4.5 g/dL (ref 3.5–5.2)
Alkaline Phosphatase: 98 U/L (ref 39–117)
BUN: 15 mg/dL (ref 6–23)
CO2: 26 meq/L (ref 19–32)
Calcium: 9.3 mg/dL (ref 8.4–10.5)
Chloride: 104 meq/L (ref 96–112)
Creatinine, Ser: 0.91 mg/dL (ref 0.40–1.20)
GFR: 72.26 mL/min (ref >60.00)
Glucose, Bld: 70 mg/dL (ref 70–99)
Potassium: 4.1 meq/L (ref 3.5–5.1)
Sodium: 138 meq/L (ref 135–145)
Total Bilirubin: 0.4 mg/dL (ref 0.2–1.2)
Total Protein: 7.4 g/dL (ref 6.0–8.3)

## 2023-05-21 LAB — CBC WITH DIFFERENTIAL/PLATELET
Basophils Absolute: 0 10*3/uL (ref 0.0–0.1)
Basophils Relative: 0.8 % (ref 0.0–3.0)
Eosinophils Absolute: 0.2 10*3/uL (ref 0.0–0.7)
Eosinophils Relative: 2.6 % (ref 0.0–5.0)
HCT: 40 % (ref 36.0–46.0)
Hemoglobin: 13.5 g/dL (ref 12.0–15.0)
Lymphocytes Relative: 35.4 % (ref 12.0–46.0)
Lymphs Abs: 2.1 10*3/uL (ref 0.7–4.0)
MCHC: 33.7 g/dL (ref 30.0–36.0)
MCV: 88.5 fl (ref 78.0–100.0)
Monocytes Absolute: 0.4 10*3/uL (ref 0.1–1.0)
Monocytes Relative: 6.8 % (ref 3.0–12.0)
Neutro Abs: 3.3 10*3/uL (ref 1.4–7.7)
Neutrophils Relative %: 54.4 % (ref 43.0–77.0)
Platelets: 209 10*3/uL (ref 150.0–400.0)
RBC: 4.52 Mil/uL (ref 3.87–5.11)
RDW: 13.5 % (ref 11.5–15.5)
WBC: 6 10*3/uL (ref 4.0–10.5)

## 2023-05-21 LAB — LIPID PANEL
Cholesterol: 243 mg/dL — ABNORMAL HIGH (ref 0–200)
HDL: 39 mg/dL — ABNORMAL LOW (ref >39.00)
LDL Cholesterol: 156 mg/dL — ABNORMAL HIGH (ref 0–99)
NonHDL: 204.46
Total CHOL/HDL Ratio: 6
Triglycerides: 243 mg/dL — ABNORMAL HIGH (ref 0.0–149.0)
VLDL: 48.6 mg/dL — ABNORMAL HIGH (ref 0.0–40.0)

## 2023-05-21 LAB — HEMOGLOBIN A1C: Hgb A1c MFr Bld: 6 % (ref 4.6–6.5)

## 2023-05-21 LAB — VITAMIN B12: Vitamin B-12: 200 pg/mL — ABNORMAL LOW (ref 211–911)

## 2023-05-21 LAB — TSH: TSH: 6.25 u[IU]/mL — ABNORMAL HIGH (ref 0.35–5.50)

## 2023-05-21 LAB — VITAMIN D 25 HYDROXY (VIT D DEFICIENCY, FRACTURES): VITD: 11.21 ng/mL — ABNORMAL LOW (ref 30.00–100.00)

## 2023-05-21 NOTE — Progress Notes (Signed)
 Patient ID: Taylor Powers, female    DOB: November 12, 1970, 53 y.o.   MRN: [id number]   Assessment & Plan:  Annual physical exam -     CBC with Differential/Platelet -     Comprehensive metabolic panel -     Hemoglobin A1c -     Lipid panel -     TSH -     Vitamin B12 -     VITAMIN D 25 Hydroxy (Vit-D Deficiency, Fractures)  Postsurgical hypothyroidism  Depression with anxiety   Age-appropriate screening and counseling performed today. Will check labs and call with results. Preventive measures discussed and printed in AVS for patient.   Patient Counseling: [x]   Nutrition: Stressed importance of moderation in sodium/caffeine intake, saturated fat and cholesterol, caloric balance, sufficient intake of fresh fruits, vegetables, and fiber.  [x]   Stressed the importance of regular exercise.   []   Substance Abuse: Discussed cessation/primary prevention of tobacco, alcohol, or other drug use; driving or other dangerous activities under the influence; availability of treatment for abuse.   []   Injury prevention: Discussed safety belts, safety helmets, smoke detector, smoking near bedding or upholstery.   []   Sexuality: Discussed sexually transmitted diseases, partner selection, use of condoms, avoidance of unintended pregnancy  and contraceptive alternatives.   [x]   Dental health: Discussed importance of regular tooth brushing, flossing, and dental visits.  [x]   Health maintenance and immunizations reviewed. Please refer to Health maintenance section.      Family history of Diabetes and Heart Disease Patient has a strong family history of diabetes and heart disease. She is overweight and has a history of poor diet and smoking. She has recently quit smoking and is making efforts to improve her lifestyle. -Order labs including CBC, CMP, A1c, cholesterol check, thyroid screening, B12 and vitamin D.  Hypothyroidism Patient has a history of thyroid cancer and total thyroidectomy. She is on thyroid  replacement therapy (dose not specified in conversation). -Check TSH to ensure appropriate dosing of thyroid replacement therapy.   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 She has recently experienced the loss of her daughter due to cancer in March last year.  -Continue current psychiatric management.   Return in about 1 year (around 05/20/2024) for physical.    Subjective:    Chief Complaint  Patient presents with   Annual Exam    Pt in office for annual CPE and fasting labs; pt isn't fasting this morning and open to coming back for lab only appt; pt calling [facility] to schedule overdue Mammogram; no concerns, pt states she has noticed stiffness in hands at times but not enough to cause concern    HPI  History of Present Illness   Majorie Powers is a 53 year old female who presents for a routine follow-up and health maintenance.  She has been significantly affected by the passing of her daughter last year, which has disrupted her self-care routines. She acknowledges not taking care of herself, eating poorly, and not exercising. She recently quit smoking last month and feels better, noting improvements in her ability to work outside in the yard.  She is concerned about her health due to her family history of diabetes, as her father has diabetes and has undergone multiple toe amputations. Her father also had a triple bypass surgery last December. She describes herself as being similar to her father in terms of body build and weight issues. She previously lost 60 pounds and had good  lab results before her daughter's illness. She and her wife, who is a type 1 diabetic, were vegan for a year. She wants to improve her diet and lifestyle again.  She has a history of thyroid cancer, for which she underwent a total thyroidectomy at age 50. She has been on levothyroxine 150 mcg since then. She experienced menopause around the same time  as her thyroid surgery and had hot flashes for about four months.   Past Medical History:  Diagnosis Date   Absence of menstruation    Allergic rhinitis, cause unspecified    Allergy    Anxiety state, unspecified    Arthritis    DDD L4-5; ruptured disc; laminectomy.   Cancer (HCC) 09/18/2010   thyroid cancer; s/p thyroidectomy total; s/p ablation.  [facility].   Chicken pox    Diabetes mellitus    Dysplasia of cervix, unspecified    Dysthymic disorder    Hematuria, unspecified    Hypersomnia, unspecified    night shift work   Insomnia, unspecified    Mixed hyperlipidemia    Other abnormal glucose    Other acne    Thyroid disease    Tobacco use disorder    Unspecified vitamin D deficiency     Past Surgical History:  Procedure Laterality Date   CHOLECYSTECTOMY N/A 04/15/2018   Procedure: LAPAROSCOPIC CHOLECYSTECTOMY WITH INTRAOPERATIVE CHOLANGIOGRAM;  Surgeon: Ovidio Kin, MD;  Location: [facility];  Service: General;  Laterality: N/A;   COSMETIC SURGERY  1978   history of LEEP  03/21/2003   LAMINECTOMY  03/20/1994   L4-L5   lipoma of shoulder  03/21/2003   Right . removed   SPINE SURGERY  03/20/1994   L4-L5 laminectomy   TOTAL THYROIDECTOMY  03/20/2010   TUBAL LIGATION  03/20/1996    Family History  Problem Relation Age of Onset   Depression Mother    Kidney disease Mother        R Nephrectomy kidney stones   Irritable bowel syndrome Mother    Anxiety disorder Mother    Miscarriages / Stillbirths Mother    Obesity Mother    Diabetes Father    Depression Father    ADD / ADHD Father    Heart disease Father    ADD / ADHD Brother    ADD / ADHD Brother    ADD / ADHD Brother    Depression Brother    Arthritis Maternal Grandmother    Cancer Maternal Grandfather    Hypertension Paternal Grandmother    Arthritis Paternal Grandmother    Heart attack Paternal Grandfather    Alcohol abuse Paternal Grandfather    Early death Paternal Grandfather    Hypertension  Paternal Grandfather    Breast cancer Daughter    Anxiety disorder Daughter    Cancer Daughter    Depression Daughter    Bipolar disorder Son    ADD / ADHD Son    Obesity Son     Social History   Tobacco Use   Smoking status: Former    Current packs/day: 0.00    Types: Cigarettes    Quit date: 04/20/2023    Years since quitting: 0.0   Smokeless tobacco: Never   Tobacco comments:    quit once for 8 mos, again for 3 years  Substance Use Topics   Alcohol use: No   Drug use: No     Allergies  Allergen Reactions   Sulfa Antibiotics Hives    All over body    Review of Systems  NEGATIVE UNLESS OTHERWISE INDICATED IN HPI      Objective:     BP 116/80 (BP Location: Left Arm, Patient Position: Sitting, Cuff Size: Normal)   Pulse 98   Temp 97.7 F (36.5 C) (Temporal)   Ht 5' 6.25" (1.683 m)   Wt 246 lb 12.8 oz (111.9 kg)   LMP 03/05/2013   SpO2 98%   BMI 39.53 kg/m   Wt Readings from Last 3 Encounters:  05/21/23 246 lb 12.8 oz (111.9 kg)  01/17/22 242 lb 12.8 oz (110.1 kg)  01/12/22 243 lb (110.2 kg)    BP Readings from Last 3 Encounters:  05/21/23 116/80  01/17/22 128/86  01/12/22 122/88     Physical Exam Vitals and nursing note reviewed.  Constitutional:      Appearance: Normal appearance. She is obese. She is not toxic-appearing.  HENT:     Head: Normocephalic and atraumatic.     Right Ear: Tympanic membrane, ear canal and external ear normal.     Left Ear: Tympanic membrane, ear canal and external ear normal.     Nose: Nose normal.     Mouth/Throat:     Mouth: Mucous membranes are moist.  Eyes:     Extraocular Movements: Extraocular movements intact.     Conjunctiva/sclera: Conjunctivae normal.     Pupils: Pupils are equal, round, and reactive to light.  Cardiovascular:     Rate and Rhythm: Normal rate and regular rhythm.     Pulses: Normal pulses.     Heart sounds: Normal heart sounds.  Pulmonary:     Effort: Pulmonary effort is normal.      Breath sounds: Normal breath sounds.  Abdominal:     General: Abdomen is flat. Bowel sounds are normal.     Palpations: Abdomen is soft.  Musculoskeletal:        General: Normal range of motion.     Cervical back: Normal range of motion and neck supple.  Skin:    General: Skin is warm and dry.     Comments: Feet are very dry, calloused skin plantar surface; N/V intact, monofilament normal  Neurological:     General: No focal deficit present.     Mental Status: She is alert and oriented to person, place, and time.     Sensory: No sensory deficit.     Motor: No weakness.  Psychiatric:        Mood and Affect: Mood normal.     Comments: Tearful at times, discussing daughter        Bary Leriche, PA-C

## 2023-05-23 ENCOUNTER — Other Ambulatory Visit: Payer: Self-pay | Admitting: Physician Assistant

## 2023-05-23 ENCOUNTER — Encounter: Payer: Self-pay | Admitting: Physician Assistant

## 2023-05-23 MED ORDER — LEVOTHYROXINE SODIUM 175 MCG PO TABS: 175.0000 ug | ORAL_TABLET | Freq: Every day | ORAL | 2 refills | Status: DC

## 2023-05-24 ENCOUNTER — Other Ambulatory Visit: Payer: Self-pay

## 2023-05-24 DIAGNOSIS — E559 Vitamin D deficiency, unspecified: Secondary | ICD-10-CM

## 2023-05-24 MED ORDER — VITAMIN D (ERGOCALCIFEROL) 1.25 MG (50000 UNIT) PO CAPS: 50000.0000 [IU] | ORAL_CAPSULE | ORAL | 0 refills | Status: DC

## 2023-06-26 ENCOUNTER — Other Ambulatory Visit: Payer: Self-pay

## 2023-06-26 ENCOUNTER — Other Ambulatory Visit: Payer: Self-pay | Admitting: Physician Assistant

## 2023-07-30 DIAGNOSIS — F909 Attention-deficit hyperactivity disorder, unspecified type: Secondary | ICD-10-CM | POA: Diagnosis not present

## 2023-07-30 DIAGNOSIS — F3341 Major depressive disorder, recurrent, in partial remission: Secondary | ICD-10-CM | POA: Diagnosis not present

## 2023-07-30 DIAGNOSIS — F411 Generalized anxiety disorder: Secondary | ICD-10-CM | POA: Diagnosis not present

## 2023-08-06 ENCOUNTER — Ambulatory Visit: Admitting: Physician Assistant

## 2023-08-13 ENCOUNTER — Other Ambulatory Visit: Payer: Self-pay | Admitting: Physician Assistant

## 2023-08-13 DIAGNOSIS — E559 Vitamin D deficiency, unspecified: Secondary | ICD-10-CM

## 2023-12-04 ENCOUNTER — Other Ambulatory Visit: Payer: Self-pay

## 2023-12-04 MED ORDER — LEVOTHYROXINE SODIUM 175 MCG PO TABS: 175.0000 ug | ORAL_TABLET | Freq: Every day | ORAL | 0 refills | Status: DC

## 2024-01-28 DIAGNOSIS — F411 Generalized anxiety disorder: Secondary | ICD-10-CM | POA: Diagnosis not present

## 2024-01-28 DIAGNOSIS — F3341 Major depressive disorder, recurrent, in partial remission: Secondary | ICD-10-CM | POA: Diagnosis not present

## 2024-01-28 DIAGNOSIS — F909 Attention-deficit hyperactivity disorder, unspecified type: Secondary | ICD-10-CM | POA: Diagnosis not present

## 2024-02-12 ENCOUNTER — Other Ambulatory Visit: Payer: Self-pay

## 2024-02-19 ENCOUNTER — Telehealth: Payer: Self-pay

## 2024-02-19 ENCOUNTER — Telehealth: Payer: Self-pay | Admitting: Physician Assistant

## 2024-02-19 NOTE — Telephone Encounter (Unsigned)
 Copied from CRM #[id number]. Topic: Clinical - Medication Refill >> Feb 19, 2024  3:08 PM Alfonso ORN wrote: Medication:  levothyroxine  (SYNTHROID ) 175 MCG tablet   Has the patient contacted their pharmacy? Yes  This is the patient's preferred pharmacy:  [facility]  [address] [phone] Fax [phone] [place] [phone] Is this the correct pharmacy for this prescription? Yes If no, delete pharmacy and type the correct one.   Has the prescription been filled recently? No  Is the patient out of the medication? Yes  Has the patient been seen for an appointment in the last year OR does the patient have an upcoming appointment? Yes  Can we respond through MyChart? No

## 2024-02-19 NOTE — Telephone Encounter (Signed)
 Called pt and lvm advising receiving Rx refill requests for Levothyroxine  and Vitamin D  however not showing last Rx sent using [facility]. Wanted to verify before sending refills for patient. Advised office cb number to return my call with correct pharmacy information.

## 2024-02-20 MED ORDER — LEVOTHYROXINE SODIUM 175 MCG PO TABS: 175.0000 ug | ORAL_TABLET | Freq: Every day | ORAL | 0 refills | Status: AC

## 2024-04-15 ENCOUNTER — Encounter: Payer: Self-pay | Admitting: Physician Assistant

## 2024-04-15 ENCOUNTER — Telehealth: Admitting: Physician Assistant

## 2024-04-15 DIAGNOSIS — K760 Fatty (change of) liver, not elsewhere classified: Secondary | ICD-10-CM | POA: Diagnosis not present

## 2024-04-15 DIAGNOSIS — R7303 Prediabetes: Secondary | ICD-10-CM | POA: Diagnosis not present

## 2024-04-15 DIAGNOSIS — E7800 Pure hypercholesterolemia, unspecified: Secondary | ICD-10-CM | POA: Diagnosis not present

## 2024-04-15 DIAGNOSIS — Z6839 Body mass index (BMI) 39.0-39.9, adult: Secondary | ICD-10-CM

## 2024-04-15 MED ORDER — ZEPBOUND 2.5 MG/0.5ML ~~LOC~~ SOAJ: 2.5000 mg | SUBCUTANEOUS | 1 refills | Status: AC

## 2024-04-15 NOTE — Progress Notes (Signed)
 "  Virtual Visit via Video Note  I connected with  [name]  on 04/15/24 at 11:00 AM EST by a video enabled telemedicine application and verified that I am speaking with the correct person using two identifiers.  Location: Patient: home Provider: [employer] at [employer] Persons present: Patient and myself   I discussed the limitations of evaluation and management by telemedicine and the availability of in person appointments. The patient expressed understanding and agreed to proceed.   History of Present Illness:  Discussed the use of AI scribe software for clinical note transcription with the patient, who gave verbal consent to proceed.  History of Present Illness Taylor Powers is a 54 year old female with prediabetes and high cholesterol who presents for a virtual visit to discuss the possible start of weight loss medication.  She is interested in discussing GLP-1 medications for weight loss, having read about them online. She is concerned about the potential for developing diabetes and cardiovascular issues, given her family history and current health status. She wants to manage her weight and health more effectively to avoid these outcomes.  Her last recorded weight was 246 pounds with a BMI of 39.7. Her A1c levels have been trending upwards, and she has high cholesterol. She previously managed her health well but has struggled since the passing of her daughter. She is starting therapy next week to help manage her overall well-being.  She has a history of thyroid  issues and is currently on levothyroxine , which was increased last year. She feels tired and achy, attributing it to being overweight and middle-aged.  She had her gallbladder removed in the past due to inflammation. An ultrasound in 2020 showed hepatic steatosis.  She does not have a formal diagnosis of sleep apnea but notes that she snores when her weight exceeds 220 pounds. No waking up short of  breath.  Family history is significant for her father having type 2 diabetes, several toes amputated, and a history of triple bypass surgery.     Observations/Objective:   Gen: Awake, alert, no acute distress Resp: Breathing is even and non-labored Psych: calm/pleasant demeanor Neuro: Alert and Oriented x 3, + facial symmetry, speech is clear.   Assessment and Plan:  Assessment and Plan Assessment & Plan Morbid obesity BMI of 39.7. Discussed GLP-1 receptor agonists for weight management, emphasizing long-term use to avoid rebound weight gain. Considered Zepbound  due to its dual action on GLP-1 and GIP receptors, potentially improving weight management and metabolic outcomes. Discussed insurance coverage challenges and alternative options like Wegovy pills or Zepbound  vials from [facility]. Potential side effects include constipation, gallbladder issues, and hair thinning.  - Sent prescription for Zepbound  pens to [facility]. - Discussed insurance coverage and prior authorization process. - Consider alternative options like Wegovy pills or Zepbound  vials from [facility] if insurance issues arise. - Patient denies personal or family history of multiple endocrine neoplasia type 2, medullary thyroid  cancer; personal history of pancreatitis or gallbladder disease.    Prediabetes Trending A1c levels. Discussed potential benefits of GLP-1 receptor agonists in improving metabolic parameters and reducing the risk of progression to type 2 diabetes. -  Lab Results  Component Value Date   HGBA1C 6.0 05/21/2023   HGBA1C 5.8 10/05/2021   HGBA1C 5.5 07/07/2017     Pure hypercholesterolemia Elevated cholesterol levels. Discussed potential benefits of GLP-1 receptor agonists in improving lipid profiles. - Included hypercholesterolemia in the diagnosis case for insurance purposes.  Hepatic steatosis Confirmed by ultrasound in 2020. - Continue to work on lifestyle  changes     Follow Up Instructions:    I discussed the assessment and treatment plan with the patient. The patient was provided an opportunity to ask questions and all were answered. The patient agreed with the plan and demonstrated an understanding of the instructions.   The patient was advised to call back or seek an in-person evaluation if the symptoms worsen or if the condition fails to improve as anticipated.  Quitman Norberto M Anjolaoluwa Siguenza, PA-C  "

## 2024-04-16 ENCOUNTER — Encounter: Payer: Self-pay | Admitting: Physician Assistant

## 2024-06-02 ENCOUNTER — Encounter: Admitting: Physician Assistant
